# Patient Record
Sex: Male | Born: 1955
Health system: Southern US, Community
[De-identification: ages and names within clinical notes are randomized; demographics above are authoritative.]

## PROBLEM LIST (undated history)

## (undated) ENCOUNTER — Ambulatory Visit: Payer: 59

## (undated) DIAGNOSIS — K429 Umbilical hernia without obstruction or gangrene: Secondary | ICD-10-CM

## (undated) DIAGNOSIS — I4891 Unspecified atrial fibrillation: Secondary | ICD-10-CM

## (undated) DIAGNOSIS — R51 Headache: Secondary | ICD-10-CM

## (undated) DIAGNOSIS — R519 Headache, unspecified: Secondary | ICD-10-CM

## (undated) DIAGNOSIS — I10 Essential (primary) hypertension: Secondary | ICD-10-CM

## (undated) DIAGNOSIS — N189 Chronic kidney disease, unspecified: Secondary | ICD-10-CM

## (undated) DIAGNOSIS — E785 Hyperlipidemia, unspecified: Secondary | ICD-10-CM

## (undated) DIAGNOSIS — I499 Cardiac arrhythmia, unspecified: Secondary | ICD-10-CM

## (undated) DIAGNOSIS — I77810 Thoracic aortic ectasia: Secondary | ICD-10-CM

## (undated) DIAGNOSIS — R011 Cardiac murmur, unspecified: Secondary | ICD-10-CM

## (undated) DIAGNOSIS — M1990 Unspecified osteoarthritis, unspecified site: Secondary | ICD-10-CM

## (undated) HISTORY — PX: HERNIA REPAIR: SHX51

## (undated) HISTORY — PX: KNEE ARTHROSCOPY: SUR90

## (undated) HISTORY — PX: JOINT REPLACEMENT: SHX530

---

## 1989-08-11 DIAGNOSIS — I499 Cardiac arrhythmia, unspecified: Secondary | ICD-10-CM

## 1989-08-11 HISTORY — DX: Cardiac arrhythmia, unspecified: I49.9

## 2011-10-21 ENCOUNTER — Other Ambulatory Visit: Payer: Self-pay

## 2011-10-21 ENCOUNTER — Emergency Department (HOSPITAL_COMMUNITY)
Admission: EM | Admit: 2011-10-21 | Discharge: 2011-10-21 | Disposition: A | Discharge status: Home / Self Care | Payer: Worker's Compensation | Attending: Emergency Medicine | Admitting: Emergency Medicine

## 2011-10-21 ENCOUNTER — Emergency Department (HOSPITAL_COMMUNITY): Payer: Worker's Compensation

## 2011-10-21 ENCOUNTER — Encounter (HOSPITAL_COMMUNITY): Payer: Self-pay | Admitting: Emergency Medicine

## 2011-10-21 DIAGNOSIS — Z7729 Contact with and (suspected ) exposure to other hazardous substances: Secondary | ICD-10-CM | POA: Insufficient documentation

## 2011-10-21 DIAGNOSIS — I10 Essential (primary) hypertension: Secondary | ICD-10-CM | POA: Insufficient documentation

## 2011-10-21 DIAGNOSIS — R51 Headache: Secondary | ICD-10-CM | POA: Insufficient documentation

## 2011-10-21 HISTORY — DX: Essential (primary) hypertension: I10

## 2011-10-21 LAB — CBC
MCV: 71.9 fL — ABNORMAL LOW (ref 78.0–100.0)
Platelets: 299 10*3/uL (ref 150–400)
RDW: 14.9 % (ref 11.5–15.5)
WBC: 7.6 10*3/uL (ref 4.0–10.5)

## 2011-10-21 LAB — BASIC METABOLIC PANEL
CO2: 27 mEq/L (ref 19–32)
Calcium: 10.2 mg/dL (ref 8.4–10.5)
Creatinine, Ser: 1.56 mg/dL — ABNORMAL HIGH (ref 0.50–1.35)
Glucose, Bld: 107 mg/dL — ABNORMAL HIGH (ref 70–99)

## 2011-10-21 LAB — DIFFERENTIAL
Basophils Relative: 0 % (ref 0–1)
Eosinophils Relative: 1 % (ref 0–5)
Lymphs Abs: 2.7 10*3/uL (ref 0.7–4.0)
Monocytes Absolute: 0.5 10*3/uL (ref 0.1–1.0)

## 2011-10-21 LAB — URINALYSIS, ROUTINE W REFLEX MICROSCOPIC
Hgb urine dipstick: NEGATIVE
Protein, ur: 100 mg/dL — AB
Urobilinogen, UA: 0.2 mg/dL (ref 0.0–1.0)

## 2011-10-21 MED ORDER — CLONIDINE HCL 0.1 MG PO TABS
0.2000 mg | ORAL_TABLET | Freq: Once | ORAL | Status: AC
  Administered 2011-10-21: 0.2 mg via ORAL
  Filled 2011-10-21: qty 2

## 2011-10-21 MED ORDER — POTASSIUM CHLORIDE CRYS ER 20 MEQ PO TBCR
40.0000 meq | EXTENDED_RELEASE_TABLET | Freq: Once | ORAL | Status: AC
  Administered 2011-10-21: 40 meq via ORAL
  Filled 2011-10-21: qty 2

## 2011-10-21 NOTE — ED Notes (Signed)
GP:[id number] Expected date:10/21/11<BR> Expected time:10:21 AM<BR> Means of arrival:<BR> Comments:<BR> Ambulance, SOB, CP

## 2011-10-21 NOTE — ED Notes (Signed)
Off floor for testing 

## 2011-10-21 NOTE — ED Notes (Signed)
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

## 2011-10-21 NOTE — ED Provider Notes (Signed)
History     CSN: [id number]  Arrival date & time 10/21/11  39   First MD Initiated Contact with Patient 10/21/11 1104      Chief Complaint  Patient presents with  . Hypertension    (Consider location/radiation/quality/duration/timing/severity/associated sxs/prior treatment) HPI Comments: Patient presents for evaluation after inhalation of unknown substance while at work. Patient works as a Technical sales engineer. States working he inhaled a white substance which small right sulfa after a bag was managed. He states he fell some throbbing in his head which resolved upon breathing normal air. He was decontaminated by the hazmat team as well as in the emergency department. The substance remains unknown after thorough evaluation by the hazmat team. Patient states he is back to his baseline. Was found to be hypertensive. Is on 3 hypertensive medications and just saw his primary care physician last week. He denies chest pain, shortness of breath, nausea, vomiting, vision changes, urinary changes.  Patient is a 56 y.o. male presenting with hypertension. The history is provided by the patient. No language interpreter was used.  Hypertension This is a chronic problem. The problem occurs constantly. The problem has not changed since onset.Associated symptoms include headaches (only when inhaling substance). Pertinent negatives include no chest pain, no abdominal pain and no shortness of breath. The symptoms are aggravated by nothing. The symptoms are relieved by nothing.    Past Medical History  Diagnosis Date  . Hypertension     Past Surgical History  Procedure Date  . Hernia repair   . Knee arthroscopy     No family history on file.  History  Substance Use Topics  . Smoking status: Not on file  . Smokeless tobacco: Never Used  . Alcohol Use: No      Review of Systems  Constitutional: Negative for fever, chills, activity change, appetite change and fatigue.  HENT: Negative for  congestion, sore throat, rhinorrhea, neck pain and neck stiffness.   Respiratory: Negative for cough and shortness of breath.   Cardiovascular: Negative for chest pain and palpitations.  Gastrointestinal: Negative for nausea, vomiting and abdominal pain.  Genitourinary: Negative for dysuria, urgency, frequency and flank pain.  Musculoskeletal: Negative for myalgias, back pain and arthralgias.  Neurological: Positive for headaches (only when inhaling substance). Negative for dizziness, weakness, light-headedness and numbness.  All other systems reviewed and are negative.    Allergies  Review of patient's allergies indicates no known allergies.  Home Medications  No current outpatient prescriptions on file.  BP 147/102  Pulse 67  Resp 18  SpO2 95%  Physical Exam  Nursing note and vitals reviewed. Constitutional: He is oriented to person, place, and time. He appears well-developed and well-nourished. No distress.       Obese male  HENT:  Head: Normocephalic and atraumatic.  Mouth/Throat: Oropharynx is clear and moist.  Eyes: Conjunctivae and EOM are normal. Pupils are equal, round, and reactive to light.  Neck: Normal range of motion. Neck supple.  Cardiovascular: Normal rate, regular rhythm, normal heart sounds and intact distal pulses.  Exam reveals no gallop and no friction rub.   No murmur heard. Pulmonary/Chest: Effort normal and breath sounds normal. No respiratory distress. He exhibits no tenderness.  Abdominal: Soft. Bowel sounds are normal. There is no tenderness.  Musculoskeletal: Normal range of motion. He exhibits no tenderness.  Neurological: He is alert and oriented to person, place, and time. No cranial nerve deficit.  Skin: Skin is warm and dry. No rash noted.  ED Course  Procedures (including critical care time)   Date: 10/21/2011  Rate: 77  Rhythm: normal sinus rhythm  QRS Axis: normal  Intervals: normal  ST/T Wave abnormalities: normal  Conduction  Disutrbances:none  Narrative Interpretation:   Old EKG Reviewed: none available  Labs Reviewed  URINALYSIS, ROUTINE W REFLEX MICROSCOPIC - Abnormal; Notable for the following:    Protein, ur 100 (*)    All other components within normal limits  CBC - Abnormal; Notable for the following:    RBC 6.15 (*)    MCV 71.9 (*)    MCHC 36.4 (*)    All other components within normal limits  BASIC METABOLIC PANEL - Abnormal; Notable for the following:    Potassium 3.1 (*)    Glucose, Bld 107 (*)    Creatinine, Ser 1.56 (*)    GFR calc non Af Amer 49 (*)    GFR calc Af Amer 56 (*)    All other components within normal limits  URINE MICROSCOPIC-ADD ON - Abnormal; Notable for the following:    Bacteria, UA FEW (*)    All other components within normal limits  DIFFERENTIAL   Dg Chest 2 View  10/21/2011  *RADIOLOGY REPORT*  Clinical Data: Possible toxin inhalation.  CHEST - 2 VIEW  Comparison: None.  Findings: Heart size and vascularity are normal and the lungs are clear.  Slight thoracic scoliosis with osteophytes.  IMPRESSION: No acute abnormalities.  Original Report Authenticated By: Larey Seat, M.D.     1. Exposure to potentially hazardous substances   2. Hypertension       MDM  Her workup was unremarkable. Arrival to emergency department after decontamination the patient was asymptomatic. He is monitored in emergency department for approximately 3 hours no change in status. He is not be hypertensive on arrival. Given dose of clonidine. Instructed to followup with his primary care physician for further evaluation and management of his blood pressure. His blood pressure is not in immediate danger. EKG is unremarkable. Has slight elevation of his creatinine 1.56 which he feels is stable. His potassium was repleted. Provided instructions for return        Trisha Mangle, MD 10/21/11 1411

## 2011-10-21 NOTE — Discharge Instructions (Signed)
Arterial Hypertension Arterial hypertension (high blood pressure) is a condition of elevated pressure in your blood vessels. Hypertension over a long period of time is a risk factor for strokes, heart attacks, and heart failure. It is also the leading cause of kidney (renal) failure.  CAUSES   In Adults -- Over 90% of all hypertension has no known cause. This is called essential or primary hypertension. In the other 10% of people with hypertension, the increase in blood pressure is caused by another disorder. This is called secondary hypertension. Important causes of secondary hypertension are:   Heavy alcohol use.   Obstructive sleep apnea.   Hyperaldosterosim (Conn's syndrome).   Steroid use.   Chronic kidney failure.   Hyperparathyroidism.   Medications.   Renal artery stenosis.   Pheochromocytoma.   Cushing's disease.   Coarctation of the aorta.   Scleroderma renal crisis.   Licorice (in excessive amounts).   Drugs (cocaine, methamphetamine).  Your caregiver can explain any items above that apply to you.  In Children -- Secondary hypertension is more common and should always be considered.   Pregnancy -- Few women of childbearing age have high blood pressure. However, up to 10% of them develop hypertension of pregnancy. Generally, this will not harm the woman. It may be a sign of 3 complications of pregnancy: preeclampsia, HELLP syndrome, and eclampsia. Follow up and control with medication is necessary.  SYMPTOMS   This condition normally does not produce any noticeable symptoms. It is usually found during a routine exam.   Malignant hypertension is a late problem of high blood pressure. It may have the following symptoms:   Headaches.   Blurred vision.   End-organ damage (this means your kidneys, heart, lungs, and other organs are being damaged).   Stressful situations can increase the blood pressure. If a person with normal blood pressure has their blood  pressure go up while being seen by their caregiver, this is often termed "white coat hypertension." Its importance is not known. It may be related with eventually developing hypertension or complications of hypertension.   Hypertension is often confused with mental tension, stress, and anxiety.  DIAGNOSIS  The diagnosis is made by 3 separate blood pressure measurements. They are taken at least 1 week apart from each other. If there is organ damage from hypertension, the diagnosis may be made without repeat measurements. Hypertension is usually identified by having blood pressure readings:  Above 140/90 mmHg measured in both arms, at 3 separate times, over a couple weeks.   Over 130/80 mmHg should be considered a risk factor and may require treatment in patients with diabetes.  Blood pressure readings over 120/80 mmHg are called "pre-hypertension" even in non-diabetic patients. To get a true blood pressure measurement, use the following guidelines. Be aware of the factors that can alter blood pressure readings.  Take measurements at least 1 hour after caffeine.   Take measurements 30 minutes after smoking and without any stress. This is another reason to quit smoking - it raises your blood pressure.   Use a proper cuff size. Ask your caregiver if you are not sure about your cuff size.   Most home blood pressure cuffs are automatic. They will measure systolic and diastolic pressures. The systolic pressure is the pressure reading at the start of sounds. Diastolic pressure is the pressure at which the sounds disappear. If you are elderly, measure pressures in multiple postures. Try sitting, lying or standing.   Sit at rest for a minimum of   5 minutes before taking measurements.   You should not be on any medications like decongestants. These are found in many cold medications.   Record your blood pressure readings and review them with your caregiver.  If you have hypertension:  Your caregiver  may do tests to be sure you do not have secondary hypertension (see "causes" above).   Your caregiver may also look for signs of metabolic syndrome. This is also called Syndrome X or Insulin Resistance Syndrome. You may have this syndrome if you have type 2 diabetes, abdominal obesity, and abnormal blood lipids in addition to hypertension.   Your caregiver will take your medical and family history and perform a physical exam.   Diagnostic tests may include blood tests (for glucose, cholesterol, potassium, and kidney function), a urinalysis, or an EKG. Other tests may also be necessary depending on your condition.  PREVENTION  There are important lifestyle issues that you can adopt to reduce your chance of developing hypertension:  Maintain a normal weight.   Limit the amount of salt (sodium) in your diet.   Exercise often.   Limit alcohol intake.   Get enough potassium in your diet. Discuss specific advice with your caregiver.   Follow a DASH diet (dietary approaches to stop hypertension). This diet is rich in fruits, vegetables, and low-fat dairy products, and avoids certain fats.  PROGNOSIS  Essential hypertension cannot be cured. Lifestyle changes and medical treatment can lower blood pressure and reduce complications. The prognosis of secondary hypertension depends on the underlying cause. Many people whose hypertension is controlled with medicine or lifestyle changes can live a normal, healthy life.  RISKS AND COMPLICATIONS  While high blood pressure alone is not an illness, it often requires treatment due to its short- and long-term effects on many organs. Hypertension increases your risk for:  CVAs or strokes (cerebrovascular accident).   Heart failure due to chronically high blood pressure (hypertensive cardiomyopathy).   Heart attack (myocardial infarction).   Damage to the retina (hypertensive retinopathy).   Kidney failure (hypertensive nephropathy).  Your caregiver can  explain list items above that apply to you. Treatment of hypertension can significantly reduce the risk of complications. TREATMENT   For overweight patients, weight loss and regular exercise are recommended. Physical fitness lowers blood pressure.   Mild hypertension is usually treated with diet and exercise. A diet rich in fruits and vegetables, fat-free dairy products, and foods low in fat and salt (sodium) can help lower blood pressure. Decreasing salt intake decreases blood pressure in a 1/3 of people.   Stop smoking if you are a smoker.  The steps above are highly effective in reducing blood pressure. While these actions are easy to suggest, they are difficult to achieve. Most patients with moderate or severe hypertension end up requiring medications to bring their blood pressure down to a normal level. There are several classes of medications for treatment. Blood pressure pills (antihypertensives) will lower blood pressure by their different actions. Lowering the blood pressure by 10 mmHg may decrease the risk of complications by as much as 25%. The goal of treatment is effective blood pressure control. This will reduce your risk for complications. Your caregiver will help you determine the best treatment for you according to your lifestyle. What is excellent treatment for one person, may not be for you. HOME CARE INSTRUCTIONS   Do not smoke.   Follow the lifestyle changes outlined in the "Prevention" section.   If you are on medications, follow the directions   carefully. Blood pressure medications must be taken as prescribed. Skipping doses reduces their benefit. It also puts you at risk for problems.   Follow up with your caregiver, as directed.   If you are asked to monitor your blood pressure at home, follow the guidelines in the "Diagnosis" section above.  SEEK MEDICAL CARE IF:   You think you are having medication side effects.   You have recurrent headaches or lightheadedness.     You have swelling in your ankles.   You have trouble with your vision.  SEEK IMMEDIATE MEDICAL CARE IF:   You have sudden onset of chest pain or pressure, difficulty breathing, or other symptoms of a heart attack.   You have a severe headache.   You have symptoms of a stroke (such as sudden weakness, difficulty speaking, difficulty walking).  MAKE SURE YOU:   Understand these instructions.   Will watch your condition.   Will get help right away if you are not doing well or get worse.  Document Released: 07/28/2005 Document Revised: 07/17/2011 Document Reviewed: 02/25/2007 [facility] Patient Information 2012 [place].  It is very important he follow up with your primary physician to discuss blood pressure control.

## 2012-03-11 ENCOUNTER — Other Ambulatory Visit: Payer: Self-pay | Admitting: Pain Medicine

## 2012-05-26 ENCOUNTER — Encounter (HOSPITAL_COMMUNITY): Payer: Self-pay | Admitting: Pharmacy Technician

## 2012-05-31 ENCOUNTER — Encounter (HOSPITAL_COMMUNITY)
Admission: RE | Admit: 2012-05-31 | Discharge: 2012-05-31 | Disposition: A | Discharge status: Home / Self Care | Payer: 59 | Source: Ambulatory Visit | Attending: Specialist | Admitting: Specialist

## 2012-05-31 ENCOUNTER — Encounter (HOSPITAL_COMMUNITY): Payer: Self-pay

## 2012-05-31 ENCOUNTER — Other Ambulatory Visit: Payer: Self-pay | Admitting: Pain Medicine

## 2012-05-31 HISTORY — DX: Unspecified osteoarthritis, unspecified site: M19.90

## 2012-05-31 LAB — URINALYSIS, ROUTINE W REFLEX MICROSCOPIC
Bilirubin Urine: NEGATIVE
Nitrite: NEGATIVE
Protein, ur: >300 mg/dL — AB
Specific Gravity, Urine: 1.017 (ref 1.005–1.030)
Urobilinogen, UA: 0.2 mg/dL (ref 0.0–1.0)

## 2012-05-31 LAB — APTT: aPTT: 28 seconds (ref 24–37)

## 2012-05-31 LAB — CBC WITH DIFFERENTIAL/PLATELET
Basophils Absolute: 0 10*3/uL (ref 0.0–0.1)
Eosinophils Absolute: 0.1 10*3/uL (ref 0.0–0.7)
Hemoglobin: 15.9 g/dL (ref 13.0–17.0)
Lymphocytes Relative: 33 % (ref 12–46)
MCHC: 35.8 g/dL (ref 30.0–36.0)
Monocytes Absolute: 0.5 10*3/uL (ref 0.1–1.0)
Neutrophils Relative %: 60 % (ref 43–77)
Platelets: 289 10*3/uL (ref 150–400)
RDW: 14.7 % (ref 11.5–15.5)

## 2012-05-31 LAB — COMPREHENSIVE METABOLIC PANEL
ALT: 24 U/L (ref 0–53)
Albumin: 3.9 g/dL (ref 3.5–5.2)
Alkaline Phosphatase: 63 U/L (ref 39–117)
Calcium: 9.8 mg/dL (ref 8.4–10.5)
Potassium: 3.2 mEq/L — ABNORMAL LOW (ref 3.5–5.1)
Sodium: 137 mEq/L (ref 135–145)
Total Protein: 7.8 g/dL (ref 6.0–8.3)

## 2012-05-31 LAB — ABO/RH: ABO/RH(D): O POS

## 2012-05-31 LAB — URINE MICROSCOPIC-ADD ON

## 2012-05-31 LAB — SURGICAL PCR SCREEN: Staphylococcus aureus: INVALID — AB

## 2012-05-31 NOTE — Pre-Procedure Instructions (Signed)
EKG AND CXR REPORTS ARE IN EPIC FROM 10/21/11. CBC, DIFF, CMET, PT, PTT, UA, T/S WERE DONE TODAY AT [facility] PREOP - AS PER ORDERS DR. R. A. COLLINS. PT HAS NOTE OF MEDICAL CLEARANCE FROM DR. MITCHELL.

## 2012-05-31 NOTE — Patient Instructions (Addendum)
YOUR SURGERY IS SCHEDULED AT [facility]  ON:  Friday  10/25  AT 7:30 AM  REPORT TO [facility]AY [facility] AT:  5:30 AM      PHONE # FOR SHORT STAY IS [phone]  DO NOT EAT OR DRINK ANYTHING AFTER MIDNIGHT THE NIGHT BEFORE YOUR SURGERY.  YOU MAY BRUSH YOUR TEETH, RINSE OUT YOUR MOUTH--BUT NO WATER, NO FOOD, NO CHEWING GUM, NO MINTS, NO CANDIES, NO CHEWING TOBACCO.  PLEASE TAKE THE FOLLOWING MEDICATIONS THE AM OF YOUR SURGERY WITH A FEW SIPS OF WATER:  AMLODIPINE AND ATENOLOL   IF YOU USE INHALERS--USE YOUR INHALERS THE AM OF YOUR SURGERY AND BRING INHALERS TO [facility].    IF YOU ARE DIABETIC:  DO NOT TAKE ANY DIABETIC MEDICATIONS THE AM OF YOUR SURGERY.  IF YOU TAKE INSULIN IN THE EVENINGS--PLEASE ONLY TAKE 1/2 NORMAL EVENING DOSE THE NIGHT BEFORE YOUR SURGERY.  NO INSULIN THE AM OF YOUR SURGERY.  IF YOU HAVE SLEEP APNEA AND USE CPAP OR BIPAP--PLEASE BRING THE MASK AND THE TUBING.  DO NOT BRING YOUR MACHINE.  DO NOT BRING VALUABLES, MONEY, CREDIT CARDS.  DO NOT WEAR JEWELRY, MAKE-UP, NAIL POLISH AND NO METAL PINS OR CLIPS IN YOUR HAIR. CONTACT LENS, DENTURES / PARTIALS, GLASSES SHOULD NOT BE WORN TO SURGERY AND IN MOST CASES-HEARING AIDS WILL NEED TO BE REMOVED.  BRING YOUR GLASSES CASE, ANY EQUIPMENT NEEDED FOR YOUR CONTACT LENS. FOR PATIENTS ADMITTED TO THE HOSPITAL--CHECK OUT TIME THE DAY OF DISCHARGE IS 11:00 AM.  ALL INPATIENT ROOMS ARE PRIVATE - WITH BATHROOM, TELEPHONE, TELEVISION AND WIFI INTERNET.  IF YOU ARE BEING DISCHARGED THE SAME DAY OF YOUR SURGERY--YOU CAN NOT DRIVE YOURSELF HOME--AND SHOULD NOT GO HOME ALONE BY TAXI OR BUS.  NO DRIVING OR OPERATING MACHINERY FOR 24 HOURS FOLLOWING ANESTHESIA / PAIN MEDICATIONS.  PLEASE MAKE ARRANGEMENTS FOR SOMEONE TO BE WITH YOU AT HOME THE FIRST 24 HOURS AFTER SURGERY. RESPONSIBLE DRIVER'S NAME___________________________                                               PHONE #   _______________________                                   PLEASE READ OVER ANY  FACT SHEETS THAT YOU WERE GIVEN: MRSA INFORMATION, BLOOD TRANSFUSION INFORMATION, INCENTIVE SPIROMETER INFORMATION.

## 2012-05-31 NOTE — Progress Notes (Signed)
05/31/12 1438  OBSTRUCTIVE SLEEP APNEA  Have you ever been diagnosed with sleep apnea through a sleep study? No  Do you snore loudly (loud enough to be heard through closed doors)?  0  Do you often feel tired, fatigued, or sleepy during the daytime? 0  Has anyone observed you stop breathing during your sleep? 0  Do you have, or are you being treated for high blood pressure? 1  BMI more than 35 kg/m2? 1  Age over 55 years old? 1  Neck circumference greater than 40 cm/18 inches? 1  Gender: 1  Obstructive Sleep Apnea Score 5   Score 4 or greater  Results sent to PCP

## 2012-06-01 NOTE — H&P (Signed)
Marvin Garcia, YUDIN NO.:  [id number]  MEDICAL RECORD NO.:  [id number]  LOCATION:  [facility]                        FACILITY:  [facility]  PHYSICIAN:  Cynda Familia, M.D.DATE OF BIRTH:  [birth year]  DATE OF ADMISSION:  03/10/2012 DATE OF DISCHARGE:                             HISTORY & PHYSICAL   HISTORY OF PRESENT ILLNESS:  The patient is a 56 year old gentleman well- known to Dr. [name] for evaluation and treatment of bilateral knee pain.  The patient's evaluations included multiple x-rays, arthroscopies of bilateral knees.  He has undergone Visco supplement injections, physical therapy, cortisone injections, yet the patient continues to have significant discomfort in bilateral knees limiting his activities of daily living.  He has also been to the point where he has been unable to work due to the pain in bilateral knees.  His left knee is worse than the right.  The patient's recent evaluation reveals x-rays that shows that he is bone on bone medial compartment with varus deformity.  He has osteophyte formation in the medial and lateral femoral condyles.  He has also got severe patellofemoral degenerative changes with osteophyte formations of the superior-inferior pole of the patella, irregular patellofemoral surfaces with osteophyte formation of the superior anterior femoral condyles bone-on-bone of the femoral tibial junction on lateral views with posterior compartment osteophyte formation.  He also has similar findings of his right knee.  Once again, the patient has failed cortisone Visco supplement, physical therapy, and oral medications.  The patient had a long discussion with Dr. [name] about his knees and discussions included total knee replacement.  The patient has elected to proceed this route.  He understands the pros, cons, possible complications, and the postoperative course.  The patient has been medically cleared by his primary care physician,  Dr. [name] for this upcoming surgical procedure.  ALLERGIES:  None.  CURRENT MEDICATIONS: 1. Lipitor 20 mg once a day. 2. Lisinopril 10 mg once a day. 3. Hydrochlorothiazide 25 mg once a day. 4. Atenolol 100 mg once a day. 5. Amlodipine 10 mg once a day. 6. Vitamin C. 7. One-A-Day Men's Vitamins. 8. Aleve. 9. Fish oil. 10.Glucosamine chondroitin.  Primary care physician is Dr. [name].  CURRENT MEDICAL HISTORY: 1. Hypertension. 2. Hypercholesterolemia. 3. Osteoarthritis of bilateral knees. 4. Obesity with a BMI of 39.5.  REVIEW OF SYSTEMS:  NEUROLOGIC:  He denies any strokes, seizures, convulsions.  No numbness or tingling, muscle weakness, loss of hearing, vision, or memory, drug or alcohol problems.  PULMONARY:  Denies any shortness of breath, productive cough, wheezing.  No history of asthma, pneumonia, COPD, sleep apnea, or tuberculosis.  CARDIOVASCULAR:  He has had a stress test greater than 10 years previous.  He does have stable hypertension, hypercholesterolemia on this current medications.  He denies any recent chest pain, pressure, shortness of breath with activities, PND.  He denies any sense of heart rate beating too fast, too slow, skipping beats.  GI:  He denies any problems with ulcers, unusual indigestion, constipation, diarrhea, jaundice, diverticular problems, hepatitis, gallbladder issues, or ulcers.  GU:  He denies any problems of urinating, no history of kidney stones, BPH, urinary tract infections.  ENDOCRINE:  He denies any unusual  sense of too cold, too hot, excessive thirst, urination, thyroid, or diabetic issues.  PAST SURGICAL HISTORY:  History of hernia repair in 1993.  He has had right and left knee scopes in the past without any complications with anesthesia in the past.  FAMILY MEDICAL HISTORY:  Father is deceased from heart disease.  Mother is alive with diabetes at the age of 4.  SOCIAL HISTORY:  The patient is married.   He works for the [employer]  He has never smoked.  No alcohol or drugs.  He has 2 grown children.  Lives with his wife who will we be providing care for him postop for the 1st week.  PHYSICAL EXAMINATION:  VITAL SIGNS:  Height is 5 feet 7 inches, his weight is 235, blood pressure is 152/102.  He is very nervous today. Pulse of 80 and regular.  Respirations 12 and nonlabored.  He is afebrile. GENERAL:  This is a healthy-appearing well-developed centrally obese gentleman, conscious, alert and appropriate.  Walks with a fairly easy balanced gait.  Has obvious varus deformities of bilateral knees. NECK:  Supple.  No palpable lymphadenopathy. CHEST:  Lung sounds were clear throughout. HEART:  Regular rate and rhythm.  No murmurs. EXTREMITIES:  Good range of motion of upper extremities without any difficulty.  Lower extremities had good range of motion.  Both hips without any difficulty.  His right knee had a varus deformity.  He was able to fully extend it and flex it back to about 115 degrees. He had soft medial joint.  He had no ligament instability.  His calf is soft, nontender.  Good pulses.  No edema in the lower extremity.  His left knee, he had a very varus deformity.  He was able to fully extend and flex it back to about 110 degrees.  He was tender in medial joint, no ligament instability.  Crepitus in knee with motion.  His calf was soft, nontender.  No signs of phlebitis or edema.  Good pulses in the ankle. Good sensation. ABDOMEN:  Soft, nontender.  Bowel sounds present.  Round, obese like. BREASTS:  Deferred. RECTAL:  Deferred. GU:  Deferred.  IMPRESSION: 1. End-stage osteoarthritis, bilateral knees, with bone-on-bone medial     compartment varus deformities with bone-on-bone patellofemoral     joint with large osteophyte formation of the patellofemoral and of     the medial femoral joint.  Bilateral knees, left more symptomatic     than right. 2.  Hypertension. 3. Hypercholesterolemia. 4. Obesity.  PLAN:  The patient will undergo all routine labs and tests prior to having a left total knee arthroplasty by Dr. [name] at [facility] on June 04, 2012.  The patient will undergo routine postoperative course.  The patient questions whether or not he may need skilled nursing due to the fact that his wife want to be home for the 1st of the week, and I did review this with him about his progress and home health physical therapy.     Evert Kohl, P.A.   ______________________________ Cynda Familia, M.D.    RWK/MEDQ  D:  05/31/2012  T:  06/01/2012  Job:  [id number]

## 2012-06-03 LAB — MRSA CULTURE

## 2012-06-04 ENCOUNTER — Inpatient Hospital Stay (HOSPITAL_COMMUNITY)
Admission: RE | Admit: 2012-06-04 | Discharge: 2012-06-07 | DRG: 470 | Disposition: A | Discharge status: Home Health | Payer: 59 | Source: Ambulatory Visit | Attending: Specialist | Admitting: Specialist

## 2012-06-04 ENCOUNTER — Inpatient Hospital Stay (HOSPITAL_COMMUNITY): Payer: 59 | Admitting: Anesthesiology

## 2012-06-04 ENCOUNTER — Encounter (HOSPITAL_COMMUNITY)
Admission: RE | Disposition: A | Discharge status: Home Health | Payer: Self-pay | Source: Ambulatory Visit | Attending: Specialist

## 2012-06-04 ENCOUNTER — Encounter (HOSPITAL_COMMUNITY): Payer: Self-pay | Admitting: Anesthesiology

## 2012-06-04 ENCOUNTER — Encounter (HOSPITAL_COMMUNITY): Payer: Self-pay | Admitting: *Deleted

## 2012-06-04 DIAGNOSIS — M1710 Unilateral primary osteoarthritis, unspecified knee: Principal | ICD-10-CM | POA: Diagnosis present

## 2012-06-04 DIAGNOSIS — Z01812 Encounter for preprocedural laboratory examination: Secondary | ICD-10-CM

## 2012-06-04 DIAGNOSIS — I10 Essential (primary) hypertension: Secondary | ICD-10-CM | POA: Diagnosis present

## 2012-06-04 DIAGNOSIS — Z96659 Presence of unspecified artificial knee joint: Secondary | ICD-10-CM

## 2012-06-04 DIAGNOSIS — E669 Obesity, unspecified: Secondary | ICD-10-CM | POA: Diagnosis present

## 2012-06-04 DIAGNOSIS — D72829 Elevated white blood cell count, unspecified: Secondary | ICD-10-CM | POA: Diagnosis not present

## 2012-06-04 HISTORY — PX: TOTAL KNEE ARTHROPLASTY: SHX125

## 2012-06-04 LAB — TYPE AND SCREEN
ABO/RH(D): O POS
Antibody Screen: NEGATIVE

## 2012-06-04 SURGERY — ARTHROPLASTY, KNEE, TOTAL
Anesthesia: Spinal | Site: Knee | Laterality: Left | Wound class: Clean

## 2012-06-04 MED ORDER — HYDRALAZINE HCL 20 MG/ML IJ SOLN
INTRAMUSCULAR | Status: DC | PRN
  Administered 2012-06-04: 2.5 mg via INTRAVENOUS
  Administered 2012-06-04 (×2): 5 mg via INTRAVENOUS
  Administered 2012-06-04: 2.5 mg via INTRAVENOUS

## 2012-06-04 MED ORDER — AMLODIPINE BESYLATE 10 MG PO TABS
10.0000 mg | ORAL_TABLET | Freq: Every morning | ORAL | Status: DC
  Administered 2012-06-05 – 2012-06-07 (×3): 10 mg via ORAL
  Filled 2012-06-04 (×3): qty 1

## 2012-06-04 MED ORDER — METHOCARBAMOL 500 MG PO TABS
500.0000 mg | ORAL_TABLET | Freq: Four times a day (QID) | ORAL | Status: DC | PRN
  Administered 2012-06-04 – 2012-06-07 (×6): 500 mg via ORAL
  Filled 2012-06-04 (×6): qty 1

## 2012-06-04 MED ORDER — FENTANYL CITRATE 0.05 MG/ML IJ SOLN
INTRAMUSCULAR | Status: DC | PRN
  Administered 2012-06-04: 25 ug via INTRAVENOUS
  Administered 2012-06-04 (×2): 50 ug via INTRAVENOUS
  Administered 2012-06-04: 25 ug via INTRAVENOUS
  Administered 2012-06-04: 50 ug via INTRAVENOUS

## 2012-06-04 MED ORDER — ATORVASTATIN CALCIUM 20 MG PO TABS
20.0000 mg | ORAL_TABLET | Freq: Every evening | ORAL | Status: DC
  Administered 2012-06-04 – 2012-06-06 (×3): 20 mg via ORAL
  Filled 2012-06-04 (×4): qty 1

## 2012-06-04 MED ORDER — ATENOLOL 100 MG PO TABS
100.0000 mg | ORAL_TABLET | Freq: Every day | ORAL | Status: DC
Start: 2012-06-05 — End: 2012-06-07
  Administered 2012-06-05 – 2012-06-07 (×3): 100 mg via ORAL
  Filled 2012-06-04 (×3): qty 1

## 2012-06-04 MED ORDER — CEFAZOLIN SODIUM-DEXTROSE 2-3 GM-% IV SOLR
2.0000 g | Freq: Four times a day (QID) | INTRAVENOUS | Status: AC
  Administered 2012-06-04 (×2): 2 g via INTRAVENOUS
  Filled 2012-06-04 (×2): qty 50

## 2012-06-04 MED ORDER — SODIUM CHLORIDE 0.9 % IR SOLN
Status: DC | PRN
  Administered 2012-06-04: 3000 mL

## 2012-06-04 MED ORDER — SODIUM CHLORIDE 0.9 % IV SOLN: INTRAVENOUS | Status: DC

## 2012-06-04 MED ORDER — BISACODYL 10 MG RE SUPP: 10.0000 mg | Freq: Every day | RECTAL | Status: DC | PRN

## 2012-06-04 MED ORDER — ACETAMINOPHEN 650 MG RE SUPP: 650.0000 mg | Freq: Four times a day (QID) | RECTAL | Status: DC | PRN

## 2012-06-04 MED ORDER — ACETAMINOPHEN 10 MG/ML IV SOLN
INTRAVENOUS | Status: DC | PRN
  Administered 2012-06-04: 1000 mg via INTRAVENOUS

## 2012-06-04 MED ORDER — ONDANSETRON HCL 4 MG/2ML IJ SOLN: 4.0000 mg | Freq: Four times a day (QID) | INTRAMUSCULAR | Status: DC | PRN

## 2012-06-04 MED ORDER — OXYCODONE HCL 5 MG PO TABS: 5.0000 mg | ORAL_TABLET | Freq: Once | ORAL | Status: DC | PRN

## 2012-06-04 MED ORDER — DOCUSATE SODIUM 100 MG PO CAPS
100.0000 mg | ORAL_CAPSULE | Freq: Two times a day (BID) | ORAL | Status: DC
  Administered 2012-06-04 – 2012-06-07 (×6): 100 mg via ORAL

## 2012-06-04 MED ORDER — MEPERIDINE HCL 50 MG/ML IJ SOLN: 6.2500 mg | INTRAMUSCULAR | Status: DC | PRN

## 2012-06-04 MED ORDER — CEFAZOLIN SODIUM-DEXTROSE 2-3 GM-% IV SOLR
2.0000 g | INTRAVENOUS | Status: AC
  Administered 2012-06-04: 2 g via INTRAVENOUS

## 2012-06-04 MED ORDER — POVIDONE-IODINE 7.5 % EX SOLN: Freq: Once | CUTANEOUS | Status: DC

## 2012-06-04 MED ORDER — PROMETHAZINE HCL 25 MG/ML IJ SOLN: 6.2500 mg | INTRAMUSCULAR | Status: DC | PRN

## 2012-06-04 MED ORDER — MENTHOL 3 MG MT LOZG
1.0000 | LOZENGE | OROMUCOSAL | Status: DC | PRN
  Filled 2012-06-04: qty 9

## 2012-06-04 MED ORDER — METOCLOPRAMIDE HCL 10 MG PO TABS: 5.0000 mg | ORAL_TABLET | Freq: Three times a day (TID) | ORAL | Status: DC | PRN

## 2012-06-04 MED ORDER — ENOXAPARIN SODIUM 30 MG/0.3ML ~~LOC~~ SOLN
30.0000 mg | Freq: Two times a day (BID) | SUBCUTANEOUS | Status: DC
  Administered 2012-06-05 – 2012-06-07 (×5): 30 mg via SUBCUTANEOUS
  Filled 2012-06-04 (×7): qty 0.3

## 2012-06-04 MED ORDER — POTASSIUM CHLORIDE IN NACL 20-0.9 MEQ/L-% IV SOLN
INTRAVENOUS | Status: DC
  Administered 2012-06-04 – 2012-06-05 (×2): via INTRAVENOUS
  Filled 2012-06-04 (×3): qty 1000

## 2012-06-04 MED ORDER — BUPIVACAINE-EPINEPHRINE 0.25% -1:200000 IJ SOLN
INTRAMUSCULAR | Status: DC | PRN
  Administered 2012-06-04: 50 mL

## 2012-06-04 MED ORDER — VITAMIN C 500 MG PO TABS
500.0000 mg | ORAL_TABLET | Freq: Every day | ORAL | Status: DC
  Administered 2012-06-05 – 2012-06-07 (×3): 500 mg via ORAL
  Filled 2012-06-04 (×3): qty 1

## 2012-06-04 MED ORDER — FERROUS SULFATE 325 (65 FE) MG PO TABS
325.0000 mg | ORAL_TABLET | Freq: Three times a day (TID) | ORAL | Status: DC
  Administered 2012-06-04 – 2012-06-07 (×8): 325 mg via ORAL
  Filled 2012-06-04 (×11): qty 1

## 2012-06-04 MED ORDER — DIPHENHYDRAMINE HCL 12.5 MG/5ML PO ELIX: 12.5000 mg | ORAL_SOLUTION | ORAL | Status: DC | PRN

## 2012-06-04 MED ORDER — PROPOFOL 10 MG/ML IV EMUL
INTRAVENOUS | Status: DC | PRN
  Administered 2012-06-04: 50 ug/kg/min via INTRAVENOUS

## 2012-06-04 MED ORDER — LACTATED RINGERS IV SOLN
INTRAVENOUS | Status: DC | PRN
  Administered 2012-06-04 (×3): via INTRAVENOUS

## 2012-06-04 MED ORDER — ONDANSETRON HCL 4 MG PO TABS: 4.0000 mg | ORAL_TABLET | Freq: Four times a day (QID) | ORAL | Status: DC | PRN

## 2012-06-04 MED ORDER — FLEET ENEMA 7-19 GM/118ML RE ENEM: 1.0000 | ENEMA | Freq: Once | RECTAL | Status: AC | PRN

## 2012-06-04 MED ORDER — METHOCARBAMOL 100 MG/ML IJ SOLN
500.0000 mg | Freq: Four times a day (QID) | INTRAVENOUS | Status: DC | PRN
  Administered 2012-06-04: 500 mg via INTRAVENOUS
  Filled 2012-06-04: qty 5

## 2012-06-04 MED ORDER — HYDROMORPHONE HCL PF 1 MG/ML IJ SOLN
0.2500 mg | INTRAMUSCULAR | Status: DC | PRN
  Administered 2012-06-04 (×4): 0.5 mg via INTRAVENOUS

## 2012-06-04 MED ORDER — ONDANSETRON HCL 4 MG/2ML IJ SOLN
INTRAMUSCULAR | Status: DC | PRN
  Administered 2012-06-04: 4 mg via INTRAVENOUS

## 2012-06-04 MED ORDER — LISINOPRIL 40 MG PO TABS
40.0000 mg | ORAL_TABLET | Freq: Every morning | ORAL | Status: DC
  Administered 2012-06-05 – 2012-06-07 (×3): 40 mg via ORAL
  Filled 2012-06-04 (×3): qty 1

## 2012-06-04 MED ORDER — METOCLOPRAMIDE HCL 5 MG/ML IJ SOLN: 5.0000 mg | Freq: Three times a day (TID) | INTRAMUSCULAR | Status: DC | PRN

## 2012-06-04 MED ORDER — ACETAMINOPHEN 10 MG/ML IV SOLN
1000.0000 mg | Freq: Four times a day (QID) | INTRAVENOUS | Status: AC
  Administered 2012-06-04 – 2012-06-05 (×4): 1000 mg via INTRAVENOUS
  Filled 2012-06-04 (×6): qty 100

## 2012-06-04 MED ORDER — PHENOL 1.4 % MT LIQD: 1.0000 | OROMUCOSAL | Status: DC | PRN

## 2012-06-04 MED ORDER — ADULT MULTIVITAMIN W/MINERALS CH
1.0000 | ORAL_TABLET | Freq: Every day | ORAL | Status: DC
  Administered 2012-06-04 – 2012-06-07 (×4): 1 via ORAL
  Filled 2012-06-04 (×4): qty 1

## 2012-06-04 MED ORDER — POLYETHYLENE GLYCOL 3350 17 G PO PACK: 17.0000 g | PACK | Freq: Every day | ORAL | Status: DC | PRN

## 2012-06-04 MED ORDER — ACETAMINOPHEN 325 MG PO TABS
650.0000 mg | ORAL_TABLET | Freq: Four times a day (QID) | ORAL | Status: DC | PRN
  Administered 2012-06-05: 650 mg via ORAL
  Filled 2012-06-04: qty 2

## 2012-06-04 MED ORDER — HYDROMORPHONE HCL PF 1 MG/ML IJ SOLN: 0.5000 mg | INTRAMUSCULAR | Status: DC | PRN

## 2012-06-04 MED ORDER — HYDROCHLOROTHIAZIDE 25 MG PO TABS
25.0000 mg | ORAL_TABLET | Freq: Every morning | ORAL | Status: DC
  Administered 2012-06-04 – 2012-06-07 (×4): 25 mg via ORAL
  Filled 2012-06-04 (×4): qty 1

## 2012-06-04 MED ORDER — OXYCODONE HCL 5 MG/5ML PO SOLN
5.0000 mg | Freq: Once | ORAL | Status: DC | PRN
  Filled 2012-06-04: qty 5

## 2012-06-04 MED ORDER — 0.9 % SODIUM CHLORIDE (POUR BTL) OPTIME
TOPICAL | Status: DC | PRN
  Administered 2012-06-04: 1000 mL

## 2012-06-04 MED ORDER — KETOROLAC TROMETHAMINE 30 MG/ML IJ SOLN
INTRAMUSCULAR | Status: DC | PRN
  Administered 2012-06-04: 30 mg via INTRAVENOUS

## 2012-06-04 MED ORDER — ALUM & MAG HYDROXIDE-SIMETH 200-200-20 MG/5ML PO SUSP: 30.0000 mL | ORAL | Status: DC | PRN

## 2012-06-04 MED ORDER — BUPIVACAINE HCL (PF) 0.75 % IJ SOLN
INTRAMUSCULAR | Status: DC | PRN
  Administered 2012-06-04: 15 mg via INTRATHECAL

## 2012-06-04 MED ORDER — KETAMINE HCL 10 MG/ML IJ SOLN
INTRAMUSCULAR | Status: DC | PRN
  Administered 2012-06-04: 10 mg via INTRAVENOUS

## 2012-06-04 MED ORDER — OXYCODONE HCL 5 MG PO TABS
5.0000 mg | ORAL_TABLET | ORAL | Status: DC | PRN
  Administered 2012-06-04 – 2012-06-06 (×13): 10 mg via ORAL
  Administered 2012-06-07: 5 mg via ORAL
  Administered 2012-06-07 (×3): 10 mg via ORAL
  Administered 2012-06-07: 5 mg via ORAL
  Filled 2012-06-04 (×17): qty 2

## 2012-06-04 MED ORDER — MIDAZOLAM HCL 5 MG/5ML IJ SOLN
INTRAMUSCULAR | Status: DC | PRN
  Administered 2012-06-04: 2 mg via INTRAVENOUS

## 2012-06-04 MED ORDER — ZOLPIDEM TARTRATE 5 MG PO TABS: 5.0000 mg | ORAL_TABLET | Freq: Every evening | ORAL | Status: DC | PRN

## 2012-06-04 MED ORDER — ACETAMINOPHEN 10 MG/ML IV SOLN: 1000.0000 mg | Freq: Once | INTRAVENOUS | Status: DC | PRN

## 2012-06-04 SURGICAL SUPPLY — 64 items
BAG SPEC THK2 15X12 ZIP CLS (MISCELLANEOUS) ×1
BAG ZIPLOCK 12X15 (MISCELLANEOUS) ×2 IMPLANT
BANDAGE ELASTIC 4 VELCRO ST LF (GAUZE/BANDAGES/DRESSINGS) ×2 IMPLANT
BANDAGE ELASTIC 6 VELCRO ST LF (GAUZE/BANDAGES/DRESSINGS) ×2 IMPLANT
BANDAGE ESMARK 6X9 LF (GAUZE/BANDAGES/DRESSINGS) ×1 IMPLANT
BANDAGE GAUZE ELAST BULKY 4 IN (GAUZE/BANDAGES/DRESSINGS) ×2 IMPLANT
BLADE SAG 18X100X1.27 (BLADE) ×2 IMPLANT
BLADE SAW SGTL 13.0X1.19X90.0M (BLADE) ×2 IMPLANT
BNDG CMPR 9X6 STRL LF SNTH (GAUZE/BANDAGES/DRESSINGS) ×1
BNDG ESMARK 6X9 LF (GAUZE/BANDAGES/DRESSINGS) ×2
CEMENT HV SMART SET (Cement) ×4 IMPLANT
CLOTH BEACON ORANGE TIMEOUT ST (SAFETY) ×2 IMPLANT
CUFF TOURN SGL QUICK 34 (TOURNIQUET CUFF) ×2
CUFF TRNQT CYL 34X4X40X1 (TOURNIQUET CUFF) ×1 IMPLANT
DRAPE EXTREMITY T 121X128X90 (DRAPE) ×2 IMPLANT
DRAPE LG THREE QUARTER DISP (DRAPES) ×2 IMPLANT
DRAPE POUCH INSTRU U-SHP 10X18 (DRAPES) ×2 IMPLANT
DRAPE U-SHAPE 47X51 STRL (DRAPES) ×2 IMPLANT
DRSG PAD ABDOMINAL 8X10 ST (GAUZE/BANDAGES/DRESSINGS) ×2 IMPLANT
DURAPREP 26ML APPLICATOR (WOUND CARE) ×2 IMPLANT
ELECT REM PT RETURN 9FT ADLT (ELECTROSURGICAL) ×2
ELECTRODE REM PT RTRN 9FT ADLT (ELECTROSURGICAL) ×1 IMPLANT
EVACUATOR 1/8 PVC DRAIN (DRAIN) ×2 IMPLANT
FACESHIELD LNG OPTICON STERILE (SAFETY) ×12 IMPLANT
GAUZE XEROFORM 2X2 STRL (GAUZE/BANDAGES/DRESSINGS) ×2 IMPLANT
GLOVE ECLIPSE 8.0 STRL XLNG CF (GLOVE) ×2 IMPLANT
GLOVE SURG ORTHO 8.0 STRL STRW (GLOVE) ×2 IMPLANT
GLOVE SURG ORTHO 9.0 STRL STRW (GLOVE) ×2 IMPLANT
GOWN STRL NON-REIN LRG LVL3 (GOWN DISPOSABLE) ×2 IMPLANT
GOWN STRL REIN XL XLG (GOWN DISPOSABLE) ×6 IMPLANT
HANDPIECE INTERPULSE COAX TIP (DISPOSABLE) ×2
IMMOBILIZER KNEE 20 (SOFTGOODS) ×2
IMMOBILIZER KNEE 20 THIGH 36 (SOFTGOODS) ×1 IMPLANT
KIT BASIN OR (CUSTOM PROCEDURE TRAY) ×2 IMPLANT
NDL SAFETY ECLIPSE 18X1.5 (NEEDLE) ×1 IMPLANT
NEEDLE HYPO 18GX1.5 SHARP (NEEDLE) ×2
NS IRRIG 1000ML POUR BTL (IV SOLUTION) ×2 IMPLANT
PACK TOTAL JOINT (CUSTOM PROCEDURE TRAY) ×2 IMPLANT
POSITIONER SURGICAL ARM (MISCELLANEOUS) ×2 IMPLANT
SET HNDPC FAN SPRY TIP SCT (DISPOSABLE) ×1 IMPLANT
SET PAD KNEE POSITIONER (MISCELLANEOUS) ×2 IMPLANT
SPONGE GAUZE 4X4 12PLY (GAUZE/BANDAGES/DRESSINGS) ×2 IMPLANT
SPONGE LAP 18X18 X RAY DECT (DISPOSABLE) IMPLANT
SPONGE SURGIFOAM ABS GEL 100 (HEMOSTASIS) ×2 IMPLANT
STOCKINETTE 6  STRL (DRAPES) ×1
STOCKINETTE 6 STRL (DRAPES) ×1 IMPLANT
STRIP CLOSURE SKIN 1/2X4 (GAUZE/BANDAGES/DRESSINGS) ×4 IMPLANT
SUCTION FRAZIER 12FR DISP (SUCTIONS) ×2 IMPLANT
SUT BONE WAX W31G (SUTURE) ×2 IMPLANT
SUT MNCRL AB 3-0 PS2 18 (SUTURE) ×2 IMPLANT
SUT VIC AB 0 CT1 27 (SUTURE)
SUT VIC AB 0 CT1 27XBRD ANTBC (SUTURE) IMPLANT
SUT VIC AB 1 CT1 27 (SUTURE) ×8
SUT VIC AB 1 CT1 27XBRD ANTBC (SUTURE) ×4 IMPLANT
SUT VIC AB 2-0 CT1 27 (SUTURE) ×4
SUT VIC AB 2-0 CT1 TAPERPNT 27 (SUTURE) ×2 IMPLANT
SUT VLOC 180 0 24IN GS25 (SUTURE) ×2 IMPLANT
SYR 50ML LL SCALE MARK (SYRINGE) ×2 IMPLANT
TAPE STRIPS DRAPE STRL (GAUZE/BANDAGES/DRESSINGS) ×2 IMPLANT
TOWEL OR 17X26 10 PK STRL BLUE (TOWEL DISPOSABLE) ×4 IMPLANT
TOWER CARTRIDGE SMART MIX (DISPOSABLE) ×2 IMPLANT
TRAY FOLEY CATH 14FRSI W/METER (CATHETERS) ×2 IMPLANT
WATER STERILE IRR 1500ML POUR (IV SOLUTION) ×2 IMPLANT
WRAP KNEE MAXI GEL POST OP (GAUZE/BANDAGES/DRESSINGS) ×2 IMPLANT

## 2012-06-04 NOTE — Anesthesia Preprocedure Evaluation (Addendum)
Anesthesia Evaluation  Patient identified by MRN, date of birth, ID band Patient awake    Reviewed: Allergy & Precautions, H&P , NPO status , Patient's Chart, lab work & pertinent test results, reviewed documented beta blocker date and time   Airway Mallampati: II TM Distance: >3 FB Neck ROM: Full    Dental  (+) Dental Advisory Given and Teeth Intact   Pulmonary neg pulmonary ROS,  breath sounds clear to auscultation  Pulmonary exam normal       Cardiovascular hypertension, Pt. on medications and Pt. on home beta blockers Rhythm:Regular Rate:Normal     Neuro/Psych negative neurological ROS  negative psych ROS   GI/Hepatic negative GI ROS, Neg liver ROS,   Endo/Other  negative endocrine ROS  Renal/GU negative Renal ROS     Musculoskeletal  (+) Arthritis -, Osteoarthritis,    Abdominal (+) + obese,   Peds  Hematology negative hematology ROS (+)   Anesthesia Other Findings   Reproductive/Obstetrics                         Anesthesia Physical Anesthesia Plan  ASA: II  Anesthesia Plan: Spinal   Post-op Pain Management:    Induction:   Airway Management Planned:   Additional Equipment:   Intra-op Plan:   Post-operative Plan:   Informed Consent: I have reviewed the patients History and Physical, chart, labs and discussed the procedure including the risks, benefits and alternatives for the proposed anesthesia with the patient or authorized representative who has indicated his/her understanding and acceptance.   Dental advisory given  Plan Discussed with: CRNA  Anesthesia Plan Comments:         Anesthesia Quick Evaluation

## 2012-06-04 NOTE — Anesthesia Procedure Notes (Signed)
Spinal  Patient location during procedure: OR Start time: 06/04/2012 7:57 AM End time: 06/04/2012 8:04 AM Staffing Anesthesiologist: Nolon Nations R Performed by: anesthesiologist  Preanesthetic Checklist Completed: patient identified, site marked, surgical consent, pre-op evaluation, timeout performed, IV checked, risks and benefits discussed and monitors and equipment checked Spinal Block Patient position: sitting Prep: ChloraPrep Patient monitoring: heart rate, continuous pulse ox and blood pressure Approach: right paramedian Location: L3-4 Injection technique: single-shot Needle Needle type: Sprotte  Needle gauge: 24 G Needle length: 9 cm Needle insertion depth: 8 cm Assessment Sensory level: T6 Additional Notes Expiration date of kit checked and confirmed. Patient tolerated procedure well, without complications.

## 2012-06-04 NOTE — Plan of Care (Signed)
Problem: Consults Goal: Diagnosis- Total Joint Replacement Left total knee     

## 2012-06-04 NOTE — H&P (Signed)
NAMESETHE, GALYEN NO.:  [id number]  MEDICAL RECORD NO.:  [id number]  LOCATION:  [facility]                        FACILITY:  [facility]  PHYSICIAN:  Cynda Familia, M.D.DATE OF BIRTH:  July 04, 1956  DATE OF ADMISSION:  03/10/2012 DATE OF DISCHARGE:                             HISTORY & PHYSICAL   HISTORY OF PRESENT ILLNESS:  The patient is a 56 year old gentleman well- known to Dr. [name] for evaluation and treatment of bilateral knee pain.  The patient's evaluations included multiple x-rays, arthroscopies of bilateral knees.  He has undergone Visco supplement injections, physical therapy, cortisone injections, yet the patient continues to have significant discomfort in bilateral knees limiting his activities of daily living.  He has also been to the point where he has been unable to work due to the pain in bilateral knees.  His left knee is worse than the right.  The patient's recent evaluation reveals x-rays that shows that he is bone on bone medial compartment with varus deformity.  He has osteophyte formation in the medial and lateral femoral condyles.  He has also got severe patellofemoral degenerative changes with osteophyte formations of the superior-inferior pole of the patella, irregular patellofemoral surfaces with osteophyte formation of the superior anterior femoral condyles bone-on-bone of the femoral tibial junction on lateral views with posterior compartment osteophyte formation.  He also has similar findings of his right knee.  Once again, the patient has failed cortisone Visco supplement, physical therapy, and oral medications.  The patient had a long discussion with Dr. [name] about his knees and discussions included total knee replacement.  The patient has elected to proceed this route.  He understands the pros, cons, possible complications, and the postoperative course.  The patient has been medically cleared by his primary care physician,  Dr. [name] for this upcoming surgical procedure.  ALLERGIES:  None.  CURRENT MEDICATIONS: 1. Lipitor 20 mg once a day. 2. Lisinopril 10 mg once a day. 3. Hydrochlorothiazide 25 mg once a day. 4. Atenolol 100 mg once a day. 5. Amlodipine 10 mg once a day. 6. Vitamin C. 7. One-A-Day Men's Vitamins. 8. Aleve. 9. Fish oil. 10.Glucosamine chondroitin.  Primary care physician is Dr. [name].  CURRENT MEDICAL HISTORY: 1. Hypertension. 2. Hypercholesterolemia. 3. Osteoarthritis of bilateral knees. 4. Obesity with a BMI of 39.5.  REVIEW OF SYSTEMS:  NEUROLOGIC:  He denies any strokes, seizures, convulsions.  No numbness or tingling, muscle weakness, loss of hearing, vision, or memory, drug or alcohol problems.  PULMONARY:  Denies any shortness of breath, productive cough, wheezing.  No history of asthma, pneumonia, COPD, sleep apnea, or tuberculosis.  CARDIOVASCULAR:  He has had a stress test greater than 10 years previous.  He does have stable hypertension, hypercholesterolemia on this current medications.  He denies any recent chest pain, pressure, shortness of breath with activities, PND.  He denies any sense of heart rate beating too fast, too slow, skipping beats.  GI:  He denies any problems with ulcers, unusual indigestion, constipation, diarrhea, jaundice, diverticular problems, hepatitis, gallbladder issues, or ulcers.  GU:  He denies any problems of urinating, no history of kidney stones, BPH, urinary tract infections.  ENDOCRINE:  He denies any unusual  sense of too cold, too hot, excessive thirst, urination, thyroid, or diabetic issues.  PAST SURGICAL HISTORY:  History of hernia repair in 1993.  He has had right and left knee scopes in the past without any complications with anesthesia in the past.  FAMILY MEDICAL HISTORY:  Father is deceased from heart disease.  Mother is alive with diabetes at the age of 17.  SOCIAL HISTORY:  The patient is married.   He works for the [employer]  He has never smoked.  No alcohol or drugs.  He has 2 grown children.  Lives with his wife who will we be providing care for him postop for the 1st week.  PHYSICAL EXAMINATION:  VITAL SIGNS:  Height is 5 feet 7 inches, his weight is 235, blood pressure is 152/102.  He is very nervous today. Pulse of 80 and regular.  Respirations 12 and nonlabored.  He is afebrile. GENERAL:  This is a healthy-appearing well-developed centrally obese gentleman, conscious, alert and appropriate.  Walks with a fairly easy balanced gait.  Has obvious varus deformities of bilateral knees. NECK:  Supple.  No palpable lymphadenopathy. CHEST:  Lung sounds were clear throughout. HEART:  Regular rate and rhythm.  No murmurs. EXTREMITIES:  Good range of motion of upper extremities without any difficulty.  Lower extremities had good range of motion.  Both hips without any difficulty.  His right knee had a varus deformity.  He was able to fully extend it and flex it back to about 115 degrees. He had soft medial joint.  He had no ligament instability.  His calf is soft, nontender.  Good pulses.  No edema in the lower extremity.  His left knee, he had a very varus deformity.  He was able to fully extend and flex it back to about 110 degrees.  He was tender in medial joint, no ligament instability.  Crepitus in knee with motion.  His calf was soft, nontender.  No signs of phlebitis or edema.  Good pulses in the ankle. Good sensation. ABDOMEN:  Soft, nontender.  Bowel sounds present.  Round, obese like. BREASTS:  Deferred. RECTAL:  Deferred. GU:  Deferred.  IMPRESSION: 1. End-stage osteoarthritis, bilateral knees, with bone-on-bone medial     compartment varus deformities with bone-on-bone patellofemoral     joint with large osteophyte formation of the patellofemoral and of     the medial femoral joint.  Bilateral knees, left more symptomatic     than right. 2.  Hypertension. 3. Hypercholesterolemia. 4. Obesity.  PLAN:  The patient will undergo all routine labs and tests prior to having a left total knee arthroplasty by Dr. [name] at [facility] on June 04, 2012.  The patient will undergo routine postoperative course.  The patient questions whether or not he may need skilled nursing due to the fact that his wife want to be home for the 1st of the week, and I did review this with him about his progress and home health physical therapy.     Evert Kohl, P.A.   ______________________________ Cynda Familia, M.D.    RWK/MEDQ  D:  05/31/2012  T:  06/01/2012  Job:  [id number]

## 2012-06-04 NOTE — Op Note (Signed)
DATE OF SURGERY:  06/04/2012  TIME: 9:56 AM  PATIENT NAME:  Marvin Garcia    AGE: 56 y.o.   PRE-OPERATIVE DIAGNOSIS:  left knee osteoarthritis  POST-OPERATIVE DIAGNOSIS:  left knee osteoarthritis  PROCEDURE:  Procedure(s): TOTAL KNEE ARTHROPLASTY  SURGEON:  Earmon Sherrow ANDREW  ASSISTANT:  Rutherford Limerick, PA-C, present and scrubbed throughout the case, critical for assistance with exposure, retraction, instrumentation, and closure.  OPERATIVE IMPLANTS: Depuy PFC Sigma Rotating Platform.  Femur size 4, Tibia size 4, Patella size 38 3-peg oval button, with a 12.5 mm polyethylene insert.   PREOPERATIVE INDICATIONS:   Marvin Garcia is a 56 y.o. year old male with end stage bone on bone arthritis of the knee who failed conservative treatment and elected for Total Knee Arthroplasty.   The risks, benefits, and alternatives were discussed at length including but not limited to the risks of infection, bleeding, nerve injury, stiffness, blood clots, the need for revision surgery, cardiopulmonary complications, among others, and they were willing to proceed.  OPERATIVE DESCRIPTION:  The patient was brought to the operative room and placed in a supine position.  Spinal anesthesia was administered.  IV antibiotics were given.  The lower extremity was prepped and draped in the usual sterile fashion.  Time out was performed.  The leg was elevated and exsanguinated and the tourniquet was inflated.  Anterior quadriceps tendon splitting approach was performed.  The patella was retracted and osteophytes were removed.  The anterior horn of the medial and lateral meniscus was removed and cruciate ligaments resected.   The distal femur was opened with the drill and the intramedullary distal femoral cutting jig was utilized, set at 5 degrees resecting 10 mm off the distal femur.  Care was taken to protect the collateral ligaments.  The distal femoral sizing jig was applied, taking care to avoid  notching.  Then the 4-in-1 cutting jig was applied and the anterior and posterior femur was cut, along with the chamfer cuts.    Then the extramedullary tibial cutting jig was utilized making the appropriate cut using the anterior tibial crest as a reference building in appropriate posterior slope.  Care was taken during the cut to protect the medial and collateral ligaments.  The proximal tibia was removed along with the posterior horns of the menisci.   The posterior medial femoral osteophytes and posterior lateral femoral osteophytes were removed.    The flexion gap was then measured and was symmetric with the extension gap, measured at 10.  I completed the distal femoral preparation using the appropriate jig to prepare the box.  The patella was then measured, and cut with the saw.    The proximal tibia sized and prepared accordingly with the reamer and the punch, and then all components were trialed with the trial insert.  The knee was found to have excellent balance and full motion.    The above named components were then cemented into place and all excess cement was removed.  The trial polyethylene component was in place during cementation, and then was exchanged for the real polyethylene component.    The knee was easily taken through a range of motion and the patella tracked well and the knee irrigated copiously and the parapatellar and subcutaneous tissue closed with vicryl, and monocryl with steri strips for the skin.  The arthrotomy was closed at 90 of flexion. The wounds were dressed with sterile gauze and the tourniquet released and the patient was awakened and returned to the PACU in stable and  satisfactory condition.  There were no complications.  Total tourniquet time was 85 minutes.Vlock suture on capsule. Perosteal injection with 50cc 0.25 percent marcaine and 30 mg of toradol.

## 2012-06-04 NOTE — Transfer of Care (Signed)
Immediate Anesthesia Transfer of Care Note  Patient: Marvin Garcia  Procedure(s) Performed: Procedure(s) (LRB) with comments: TOTAL KNEE ARTHROPLASTY (Left)  Patient Location: PACU  Anesthesia Type: Spinal  Level of Consciousness: awake, alert , oriented and patient cooperative  Airway & Oxygen Therapy: Patient Spontanous Breathing and Patient connected to face mask oxygen  Post-op Assessment: Report given to PACU RN and Post -op Vital signs reviewed and stable  Post vital signs: Reviewed and stable  Complications: No apparent anesthesia complications

## 2012-06-04 NOTE — Anesthesia Postprocedure Evaluation (Signed)
Anesthesia Post Note  Patient: Marvin Garcia  Procedure(s) Performed: Procedure(s) (LRB): TOTAL KNEE ARTHROPLASTY (Left)  Anesthesia type: Spinal  Patient location: PACU  Post pain: Pain level controlled  Post assessment: Post-op Vital signs reviewed  Last Vitals: BP 142/87  Pulse 64  Temp 36.9 C  Resp 12  SpO2 100%  Post vital signs: Reviewed  Level of consciousness: sedated  Complications: Incomplete spinal blockade in OR. Pt comfortable and remained under MAC without issue. No apparent anesthesia complications

## 2012-06-04 NOTE — Evaluation (Signed)
Physical Therapy Evaluation Patient Details Name: Marvin Garcia MRN: [id number] DOB: [birth year] Today's Date: 06/04/2012 Time: [id number] PT Time Calculation (min): 21 min  PT Assessment / Plan / Recommendation Clinical Impression  Pt s/p L TKR. Pt seen POD #0 and agreeable for mobility.  Pt would benefit from acute PT services in order to improve independence with transfers, ambulation and step by increasing L LE strength and ROM to prepare for d/c home with spouse.    PT Assessment  Patient needs continued PT services    Follow Up Recommendations  Home health PT    Does the patient have the potential to tolerate intense rehabilitation      Barriers to Discharge        Equipment Recommendations  Rolling walker with 5" wheels;3 in 1 bedside comode    Recommendations for Other Services     Frequency 7X/week    Precautions / Restrictions Precautions Precautions: Knee Required Braces or Orthoses: Knee Immobilizer - Left Knee Immobilizer - Left: On except when in CPM Restrictions LLE Weight Bearing: Weight bearing as tolerated   Pertinent Vitals/Pain Pt reports no pain.  Repositioned in recliner.      Mobility  Bed Mobility Bed Mobility: Supine to Sit Supine to Sit: 4: Min assist;HOB elevated Details for Bed Mobility Assistance: verbal cues for technique, assist for L LE Transfers Transfers: Stand to Sit;Sit to Stand Sit to Stand: 4: Min guard;With upper extremity assist;From bed Stand to Sit: 4: Min guard;With upper extremity assist;To chair/3-in-1 Details for Transfer Assistance: verbal cues for safe technique Ambulation/Gait Ambulation/Gait Assistance: 4: Min guard Ambulation Distance (Feet): 80 Feet Assistive device: Rolling walker Ambulation/Gait Assistance Details: verbal cues for sequence and RW placement, increased cues for safe RW distance Gait Pattern: Step-to pattern;Antalgic    Shoulder Instructions     Exercises     PT Diagnosis: Difficulty  walking  PT Problem List: Decreased strength;Decreased range of motion;Decreased mobility;Decreased knowledge of use of DME;Decreased knowledge of precautions PT Treatment Interventions: DME instruction;Gait training;Stair training;Functional mobility training;Therapeutic activities;Therapeutic exercise;Patient/family education   PT Goals Acute Rehab PT Goals PT Goal Formulation: With patient Time For Goal Achievement: 06/11/12 Potential to Achieve Goals: Good Pt will go Supine/Side to Sit: with modified independence PT Goal: Supine/Side to Sit - Progress: Goal set today Pt will go Sit to Supine/Side: with modified independence PT Goal: Sit to Supine/Side - Progress: Goal set today Pt will go Sit to Stand: with modified independence PT Goal: Sit to Stand - Progress: Goal set today Pt will go Stand to Sit: with modified independence PT Goal: Stand to Sit - Progress: Goal set today Pt will Ambulate: 51 - 150 feet;with modified independence;with least restrictive assistive device PT Goal: Ambulate - Progress: Goal set today Pt will Go Up / Down Stairs: 1-2 stairs;with supervision;with least restrictive assistive device PT Goal: Up/Down Stairs - Progress: Goal set today Pt will Perform Home Exercise Program: with supervision, verbal cues required/provided PT Goal: Perform Home Exercise Program - Progress: Goal set today  Visit Information  Last PT Received On: 06/04/12 Assistance Needed: +1    Subjective Data  Subjective: Oh good.  I'm getting restless in this bed.   Prior Functioning  Home Living Lives With: Spouse Type of Home: House Home Access: Stairs to enter Technical brewer of Steps: 1 Entrance Stairs-Rails: None Home Layout: One level Home Adaptive Equipment: None Prior Function Level of Independence: Independent Communication Communication: No difficulties    Cognition  Overall Cognitive Status: Appears within functional  limits for tasks  assessed/performed Arousal/Alertness: Awake/alert Orientation Level: Appears intact for tasks assessed Behavior During Session: [facility] for tasks performed    Extremity/Trunk Assessment Right Upper Extremity Assessment RUE ROM/Strength/Tone: [facility] for tasks assessed (reports shoulder pops sometimes (from younger volleyball yrs) Left Upper Extremity Assessment LUE ROM/Strength/Tone: WFL for tasks assessed Right Lower Extremity Assessment RLE ROM/Strength/Tone: [facility] for tasks assessed Left Lower Extremity Assessment LLE ROM/Strength/Tone: Deficits LLE ROM/Strength/Tone Deficits: pt reports no numbness in LE, fair quad contraction, maintained KI   Balance    End of Session PT - End of Session Equipment Utilized During Treatment: Left knee immobilizer Activity Tolerance: Patient tolerated treatment well Patient left: in chair;with call bell/phone within reach Nurse Communication: Mobility status  GP     Niraj Kudrna,KATHrine E 06/04/2012, 4:25 PM Pager: [phone]

## 2012-06-05 LAB — CBC
HCT: 39.6 % (ref 39.0–52.0)
Hemoglobin: 14.2 g/dL (ref 13.0–17.0)
MCHC: 35.9 g/dL (ref 30.0–36.0)

## 2012-06-05 LAB — BASIC METABOLIC PANEL
BUN: 14 mg/dL (ref 6–23)
Chloride: 97 mEq/L (ref 96–112)
GFR calc non Af Amer: 52 mL/min — ABNORMAL LOW (ref >90)
Glucose, Bld: 125 mg/dL — ABNORMAL HIGH (ref 70–99)
Potassium: 3.4 mEq/L — ABNORMAL LOW (ref 3.5–5.1)

## 2012-06-05 MED ORDER — POTASSIUM CHLORIDE CRYS ER 20 MEQ PO TBCR: 20.0000 meq | EXTENDED_RELEASE_TABLET | Freq: Two times a day (BID) | ORAL | Status: DC

## 2012-06-05 MED ORDER — POTASSIUM CHLORIDE CRYS ER 20 MEQ PO TBCR
20.0000 meq | EXTENDED_RELEASE_TABLET | Freq: Two times a day (BID) | ORAL | Status: AC
  Administered 2012-06-05 (×2): 20 meq via ORAL
  Filled 2012-06-05 (×2): qty 1

## 2012-06-05 NOTE — Progress Notes (Signed)
Physical Therapy Treatment Patient Details Name: Marvin Garcia MRN: [id number] DOB: 1956-07-21 Today's Date: 06/05/2012 Time: [id number] PT Time Calculation (min): 23 min  PT Assessment / Plan / Recommendation Comments on Treatment Session       Follow Up Recommendations  Home health PT     Does the patient have the potential to tolerate intense rehabilitation     Barriers to Discharge        Equipment Recommendations  3 in 1 bedside comode;Rolling walker with 5" wheels    Recommendations for Other Services OT consult  Frequency 7X/week   Plan Discharge plan remains appropriate    Precautions / Restrictions Precautions Precautions: Knee Required Braces or Orthoses: Knee Immobilizer - Left Knee Immobilizer - Left: On except when in CPM Restrictions Weight Bearing Restrictions: No LLE Weight Bearing: Weight bearing as tolerated   Pertinent Vitals/Pain 3/10    Mobility  Bed Mobility Bed Mobility: Sit to Supine Sit to Supine: 4: Min assist Details for Bed Mobility Assistance: min VC for sequence; min assist with L LE Transfers Transfers: Stand to Sit;Sit to Stand Sit to Stand: 5: Supervision;From chair/3-in-1;With armrests Stand to Sit: 4: Min guard;To bed;With upper extremity assist Details for Transfer Assistance: verbal cues for safe technique Ambulation/Gait Ambulation/Gait Assistance: 4: Min guard Ambulation Distance (Feet): 115 Feet (twice) Assistive device: Rolling walker Ambulation/Gait Assistance Details: min VC for posture and position from RW Gait Pattern: Step-to pattern    Exercises     PT Diagnosis:    PT Problem List:   PT Treatment Interventions:     PT Goals Acute Rehab PT Goals PT Goal Formulation: With patient Time For Goal Achievement: 06/11/12 Potential to Achieve Goals: Good Pt will go Supine/Side to Sit: with modified independence PT Goal: Supine/Side to Sit - Progress: Progressing toward goal Pt will go Sit to Supine/Side: with  modified independence PT Goal: Sit to Supine/Side - Progress: Progressing toward goal Pt will go Sit to Stand: with modified independence PT Goal: Sit to Stand - Progress: Progressing toward goal Pt will go Stand to Sit: with modified independence PT Goal: Stand to Sit - Progress: Progressing toward goal Pt will Ambulate: 51 - 150 feet;with modified independence;with least restrictive assistive device PT Goal: Ambulate - Progress: Progressing toward goal Pt will Go Up / Down Stairs: 1-2 stairs;with supervision;with least restrictive assistive device PT Goal: Up/Down Stairs - Progress: Progressing toward goal Pt will Perform Home Exercise Program: with supervision, verbal cues required/provided PT Goal: Perform Home Exercise Program - Progress: Progressing toward goal  Visit Information  Last PT Received On: 06/05/12 Assistance Needed: +1    Subjective Data  Subjective: I'm ready Patient Stated Goal: Resume previous lifestyle with decreased pain   Cognition  Overall Cognitive Status: Appears within functional limits for tasks assessed/performed Arousal/Alertness: Awake/alert Orientation Level: Appears intact for tasks assessed Behavior During Session: [facility] for tasks performed    Balance     End of Session PT - End of Session Activity Tolerance: Patient tolerated treatment well Patient left: in bed;with call bell/phone within reach Nurse Communication: Mobility status CPM Left Knee CPM Left Knee: On Left Knee Flexion (Degrees): 60  Left Knee Extension (Degrees): 0    GP     Marvin Garcia 06/05/2012, 3:47 PM

## 2012-06-05 NOTE — Plan of Care (Signed)
Problem: Consults Goal: Diagnosis- Total Joint Replacement Outcome: Completed/Met Date Met:  06/05/12 Primary Total Knee LEFT

## 2012-06-05 NOTE — Progress Notes (Signed)
Subjective: Patient is a bedside chair working with physical therapy he denies any complaints was able to eat this morning without difficulty his pain medicine working well no shortness of breath chest pains.   Objective: Vital signs in last 24 hours: Temp:  [97.5 F (36.4 C)-98.9 F (37.2 C)] 98.9 F (37.2 C) (10/26 0559) Pulse Rate:  [59-71] 65  (10/26 0559) Resp:  [10-17] 16  (10/26 0800) BP: (119-150)/(75-96) 148/88 mmHg (10/26 0559) SpO2:  [95 %-100 %] 98 % (10/26 0559) Weight:  [110.678 kg (244 lb)] 110.678 kg (244 lb) (10/25 1130)  Intake/Output from previous day: 10/25 0701 - 10/26 0700 In: 5372.5 [P.O.:980; I.V.:3987.5; IV Piggyback:405] Out: [id number] [Urine:3700; Drains:217; Blood:150] Intake/Output this shift: Total I/O In: -  Out: 150 [Urine:150]   Basename 06/05/12 0550  HGB 14.2    Basename 06/05/12 0550  WBC 13.9*  RBC 5.42  HCT 39.6  PLT 266    Basename 06/05/12 0550  NA 136  K 3.4*  CL 97  CO2 30  BUN 14  CREATININE 1.47*  GLUCOSE 125*  CALCIUM 9.0   No results found for this basename: LABPT:2,INR:2 in the last 72 hours  Patient's conscious alert appropriate appears to be very comfortable in a bedside chair. His lungs were clear throughout his heart was regular rate and rhythm his abdomen soft nontender. His left leg dressing was intact his Hemovac drain was DC'd intact his calf was soft nontender his leg was neuromotor vascularly intact  Assessment/Plan: Postop day #1 status post left total knee arthroplasty doing very well Mild hypokalemia despite IV fluids its potassium will supplement by mouth x2 doses and rechecked in the morning  Plan continue with physical therapy per protocol will start CPM today. Cutback IV hydration. Potassium x2 doses. Recheck labs in the morning. Dressing change in the morning. If doing well will be able to DC home Sunday or Monday with home health physical therapy    Evert Kohl 06/05/2012, 8:16 AM

## 2012-06-05 NOTE — Progress Notes (Signed)
Physical Therapy Treatment Patient Details Name: Marvin Garcia MRN: [id number] DOB: July 12, 1956 Today's Date: 06/05/2012 Time: [id number] PT Time Calculation (min): 40 min  PT Assessment / Plan / Recommendation Comments on Treatment Session       Follow Up Recommendations  Home health PT     Does the patient have the potential to tolerate intense rehabilitation     Barriers to Discharge        Equipment Recommendations  3 in 1 bedside comode;Rolling walker with 5" wheels    Recommendations for Other Services OT consult  Frequency 7X/week   Plan Discharge plan remains appropriate    Precautions / Restrictions Precautions Precautions: Knee Required Braces or Orthoses: Knee Immobilizer - Left Knee Immobilizer - Left: On except when in CPM (Pt performed IND SLR this am) Restrictions Weight Bearing Restrictions: No LLE Weight Bearing: Weight bearing as tolerated   Pertinent Vitals/Pain     Mobility  Bed Mobility Bed Mobility:  (Pt states does not want to get into bed 2* comfort issues) Transfers Transfers: Stand to Sit;Sit to Stand Sit to Stand: 5: Supervision;With upper extremity assist;From chair/3-in-1 Stand to Sit: 5: Supervision;Without upper extremity assist;To chair/3-in-1 Details for Transfer Assistance: verbal cues for safe technique Ambulation/Gait Ambulation/Gait Assistance: 4: Min guard Ambulation Distance (Feet): 123 Feet Assistive device: Rolling walker Ambulation/Gait Assistance Details: min cues for sequence, posture, position from RW and stride length Gait Pattern: Step-to pattern    Exercises Total Joint Exercises Ankle Circles/Pumps: AROM;Both;20 reps;Supine Quad Sets: AROM;20 reps;Supine;Both Heel Slides: AAROM;15 reps;Supine;Left Straight Leg Raises: AAROM;20 reps;Supine;Left   PT Diagnosis:    PT Problem List:   PT Treatment Interventions:     PT Goals Acute Rehab PT Goals PT Goal Formulation: With patient Time For Goal Achievement:  06/11/12 Potential to Achieve Goals: Good Pt will go Supine/Side to Sit: with modified independence Pt will go Sit to Stand: with modified independence PT Goal: Sit to Stand - Progress: Progressing toward goal Pt will go Stand to Sit: with modified independence PT Goal: Stand to Sit - Progress: Progressing toward goal Pt will Ambulate: 51 - 150 feet;with modified independence;with least restrictive assistive device PT Goal: Ambulate - Progress: Progressing toward goal Pt will Go Up / Down Stairs: 1-2 stairs;with supervision;with least restrictive assistive device Pt will Perform Home Exercise Program: with supervision, verbal cues required/provided PT Goal: Perform Home Exercise Program - Progress: Progressing toward goal  Visit Information  Last PT Received On: 06/05/12 Assistance Needed: +1    Subjective Data  Subjective: I'm ready Patient Stated Goal: Resume previous lifestyle with decreased pain   Cognition  Overall Cognitive Status: Appears within functional limits for tasks assessed/performed Arousal/Alertness: Awake/alert Orientation Level: Appears intact for tasks assessed Behavior During Session: [facility] for tasks performed    Balance  Balance Balance Assessed: Yes Static Standing Balance Static Standing - Balance Support: Right upper extremity supported;Left upper extremity supported Static Standing - Level of Assistance: 5: Stand by assistance  End of Session PT - End of Session Activity Tolerance: Patient tolerated treatment well Patient left: in chair;with call bell/phone within reach Nurse Communication: Mobility status   GP     Marvin Garcia 06/05/2012, 1:53 PM

## 2012-06-05 NOTE — Care Management Note (Signed)
    Page 1 of 2   06/07/2012     2:41:31 PM   CARE MANAGEMENT NOTE 06/07/2012  Patient:  Marvin Garcia, Marvin Garcia   Account Number:  [id number]  Date Initiated:  06/05/2012  Documentation initiated by:  Dessa Phi  Subjective/Objective Assessment:   ADMITTED W/L TKA.     Action/Plan:   FROM HOME   Anticipated DC Date:  06/07/2012   Anticipated DC Plan:  [place]  In-house referral  NA      DC Planning Services  CM consult      PAC Choice  [place]   Choice offered to / List presented to:  C-1 Patient   DME arranged  3-N-1  Vassie Moselle      DME agency  [place] arranged  HH-2 PT      [facility] agency  [facility]   Status of service:  Completed, signed off Medicare Important Message given?  NO (If response is "NO", the following Medicare IM given date fields will be blank) Date Medicare IM given:   Date Additional Medicare IM given:    Discharge Disposition:  [place]  Per UR Regulation:  Reviewed for med. necessity/level of care/duration of stay  If discussed at [place] of Stay Meetings, dates discussed:    Comments:  06/07/2012 Kem Kays RN CCM [phone] Cm spoke with patient. Plans are for him to return to his home in [place] where spouse will be caregiver. He will need RW.  CPM FOR HOME USE has already been ordered from [employer] by doctor's office prior to hospital admission. Pt's states he has already been contacted regarding delivery of CPM at his home upon his discharge. [facility] will provide [facility] services with start date tomorrow 06/08/2012. Pt discharged today.;Cotact information given to pt's spouse regarding [place] agency.  06/05/12 KATHY MAHABIR RN,BSN NCM [place] LIST.[place].HHPT,3N1,RW RECOMMENDED.

## 2012-06-05 NOTE — Evaluation (Signed)
Occupational Therapy Evaluation Patient Details Name: Marvin Garcia MRN: [id number] DOB: [birth year] Today's Date: 06/05/2012 Time: [id number] OT Time Calculation (min): 20 min  OT Assessment / Plan / Recommendation Clinical Impression  Pleasant 56yr old male admitted for elective left TKA.  Pt presents with decreased overall independence with mobility and selfcare at a supevision to min assist level.  Feel he will prgress nicely, and will have 24 hour supervision from his wife at discharge.  Will need 3:1 for home.  No further OT needs at this time.       Follow Up Recommendations  No OT follow up       Equipment Recommendations  3 in 1 bedside comode;Rolling walker with 5" wheels          Precautions / Restrictions Precautions Precautions: Knee Restrictions Weight Bearing Restrictions: No LLE Weight Bearing: Weight bearing as tolerated   Pertinent Vitals/Pain No report of pain when asked    ADL  Eating/Feeding: Simulated;Independent Where Assessed - Eating/Feeding: Chair Grooming: Simulated;Supervision/safety Where Assessed - Grooming: Unsupported standing Upper Body Bathing: Simulated;Set up Where Assessed - Upper Body Bathing: Unsupported sitting Lower Body Bathing: Simulated;Minimal assistance Where Assessed - Lower Body Bathing: Supported standing Upper Body Dressing: Simulated;Set up Where Assessed - Upper Body Dressing: Unsupported sitting Lower Body Dressing: Simulated;Minimal assistance Where Assessed - Lower Body Dressing: Supported sit to stand Toilet Transfer: Chartered loss adjuster Method: Arts development officer: Therapist, occupational and Hygiene: Simulated;Supervision/safety Where Assessed - Best boy and Hygiene: Sit to stand from 3-in-1 or toilet Tub/Shower Transfer Method: Not assessed Equipment Used: Rolling walker Transfers/Ambulation Related to ADLs: Pt is  overall supervision for mobility to and from the bathroom with his RW. ADL Comments: Pt with slight difficulty reaching his LLE but will have assistance from family until he can do it on his own.  Do not feel he will need AE after a few days, so do not recommend at this time.  Do feel he will benefit from a 3:1 however for use over his toilet.      Visit Information  Last OT Received On: 06/05/12 Assistance Needed: +1    Subjective Data  Subjective: "I'll wait and see how it goes." (regarding the tub bench) Patient Stated Goal: Did not state but agreeable to participate in therapy.   Prior Functioning     Home Living Lives With: Spouse Type of Home: House Home Access: Stairs to enter Technical brewer of Steps: 1 Entrance Stairs-Rails: None Home Layout: One level Bathroom Shower/Tub: Product/process development scientist: Standard Bathroom Accessibility: Yes Home Adaptive Equipment: None Prior Function Level of Independence: Independent Driving: Yes Communication Communication: No difficulties Dominant Hand: Right         Vision/Perception Vision - Assessment Eye Alignment: Within Functional Limits Vision Assessment: Vision not tested Perception Perception: Within Functional Limits Praxis Praxis: Intact   Cognition  Overall Cognitive Status: Appears within functional limits for tasks assessed/performed Arousal/Alertness: Awake/alert Orientation Level: Appears intact for tasks assessed Behavior During Session: [facility] for tasks performed    Extremity/Trunk Assessment Right Upper Extremity Assessment RUE ROM/Strength/Tone: Within functional levels RUE Sensation: WFL - Light Touch RUE Coordination: WFL - gross/fine motor Left Upper Extremity Assessment LUE ROM/Strength/Tone: Within functional levels LUE Sensation: WFL - Light Touch LUE Coordination: WFL - gross/fine motor     Mobility Transfers Transfers: Sit to Stand Sit to Stand: 5: Supervision;With  upper extremity assist;From chair/3-in-1 Stand to Sit: 5: Supervision;Without upper extremity assist;To chair/3-in-1  Balance Balance Balance Assessed: Yes Static Standing Balance Static Standing - Balance Support: Right upper extremity supported;Left upper extremity supported Static Standing - Level of Assistance: 5: Stand by assistance   End of Session OT - End of Session Activity Tolerance: Patient tolerated treatment well Patient left: in chair;with call bell/phone within reach Nurse Communication: Mobility status     Henning OTR/L Pager number [phone] 06/05/2012, 10:21 AM

## 2012-06-06 LAB — CBC
HCT: 35.9 % — ABNORMAL LOW (ref 39.0–52.0)
MCV: 72.7 fL — ABNORMAL LOW (ref 78.0–100.0)
RBC: 4.94 MIL/uL (ref 4.22–5.81)
WBC: 14.4 10*3/uL — ABNORMAL HIGH (ref 4.0–10.5)

## 2012-06-06 LAB — BASIC METABOLIC PANEL
BUN: 16 mg/dL (ref 6–23)
CO2: 31 mEq/L (ref 19–32)
Chloride: 97 mEq/L (ref 96–112)
Creatinine, Ser: 1.54 mg/dL — ABNORMAL HIGH (ref 0.50–1.35)

## 2012-06-06 LAB — GLUCOSE, CAPILLARY: Glucose-Capillary: 112 mg/dL — ABNORMAL HIGH (ref 70–99)

## 2012-06-06 MED ORDER — HEPARIN SOD (PORK) LOCK FLUSH 100 UNIT/ML IV SOLN: 500.0000 [IU] | Freq: Once | INTRAVENOUS | Status: DC

## 2012-06-06 NOTE — Progress Notes (Signed)
Physical Therapy Treatment Patient Details Name: Marvin Garcia MRN: [id number] DOB: [birth year] Today's Date: 06/06/2012 Time: [id number] PT Time Calculation (min): 21 min  PT Assessment / Plan / Recommendation Comments on Treatment Session       Follow Up Recommendations  Home health PT     Does the patient have the potential to tolerate intense rehabilitation     Barriers to Discharge        Equipment Recommendations  3 in 1 bedside comode;Rolling walker with 5" wheels    Recommendations for Other Services OT consult  Frequency 7X/week   Plan Discharge plan remains appropriate    Precautions / Restrictions Precautions Precautions: Knee Required Braces or Orthoses: Knee Immobilizer - Left Knee Immobilizer - Left: On except when in CPM Restrictions Weight Bearing Restrictions: No LLE Weight Bearing: Weight bearing as tolerated   Pertinent Vitals/Pain 4/10    Mobility  Bed Mobility Bed Mobility: Supine to Sit Sit to Supine: 4: Min guard;5: Supervision Transfers Transfers: Stand to Sit;Sit to Stand Sit to Stand: 5: Supervision Stand to Sit: 5: Supervision Details for Transfer Assistance: min cues for LE management Ambulation/Gait Ambulation/Gait Assistance: 4: Min guard;5: Supervision Ambulation Distance (Feet): 200 Feet Assistive device: Rolling walker Ambulation/Gait Assistance Details: min cues for posture, position from RW Gait Pattern: Step-to pattern Stairs: Yes Stairs Assistance: 4: Min assist Stairs Assistance Details (indicate cue type and reason): cues for sequence and for foot/RW placement Stair Management Technique: No rails;Step to pattern;Backwards;Forwards Number of Stairs: 1  (once fwd and twice bkwd (did better bkwd))    Exercises     PT Diagnosis:    PT Problem List:   PT Treatment Interventions:     PT Goals Acute Rehab PT Goals PT Goal Formulation: With patient Time For Goal Achievement: 06/11/12 Potential to Achieve Goals:  Good Pt will go Supine/Side to Sit: with modified independence PT Goal: Supine/Side to Sit - Progress: Progressing toward goal Pt will go Sit to Supine/Side: with modified independence PT Goal: Sit to Supine/Side - Progress: Progressing toward goal Pt will go Sit to Stand: with modified independence PT Goal: Sit to Stand - Progress: Progressing toward goal Pt will go Stand to Sit: with modified independence PT Goal: Stand to Sit - Progress: Progressing toward goal Pt will Ambulate: 51 - 150 feet;with modified independence;with least restrictive assistive device PT Goal: Ambulate - Progress: Progressing toward goal Pt will Go Up / Down Stairs: 1-2 stairs;with supervision;with least restrictive assistive device PT Goal: Up/Down Stairs - Progress: Progressing toward goal Pt will Perform Home Exercise Program: with supervision, verbal cues required/provided PT Goal: Perform Home Exercise Program - Progress: Progressing toward goal  Visit Information  Last PT Received On: 06/06/12 Assistance Needed: +1    Subjective Data  Patient Stated Goal: Resume previous lifestyle with decreased pain   Cognition  Overall Cognitive Status: Appears within functional limits for tasks assessed/performed Arousal/Alertness: Awake/alert Orientation Level: Appears intact for tasks assessed Behavior During Session: [facility] for tasks performed    Balance     End of Session PT - End of Session Activity Tolerance: Patient tolerated treatment well Patient left: with call bell/phone within reach;in bed Nurse Communication: Mobility status   GP     Tai Syfert 06/06/2012, 3:55 PM

## 2012-06-06 NOTE — Progress Notes (Signed)
Physical Therapy Treatment Patient Details Name: Marvin Garcia MRN: [id number] DOB: [birth year] Today's Date: 06/06/2012 Time: [id number] PT Time Calculation (min): 38 min  PT Assessment / Plan / Recommendation Comments on Treatment Session       Follow Up Recommendations  Home health PT     Does the patient have the potential to tolerate intense rehabilitation     Barriers to Discharge        Equipment Recommendations  3 in 1 bedside comode;Rolling walker with 5" wheels    Recommendations for Other Services OT consult  Frequency 7X/week   Plan Discharge plan remains appropriate    Precautions / Restrictions Precautions Precautions: Knee Required Braces or Orthoses: Knee Immobilizer - Left Knee Immobilizer - Left: On except when in CPM (Pt performed IND SLR this am) Restrictions Weight Bearing Restrictions: No LLE Weight Bearing: Weight bearing as tolerated   Pertinent Vitals/Pain 4/10; premedicated, ice packs provided    Mobility  Transfers Transfers: Stand to Sit;Sit to Stand Sit to Stand: 5: Supervision Stand to Sit: 5: Supervision Details for Transfer Assistance: min cues for LE management Ambulation/Gait Ambulation/Gait Assistance: 4: Min guard;5: Supervision Ambulation Distance (Feet): 140 Feet (twice) Assistive device: Rolling walker Ambulation/Gait Assistance Details: min cues for posture and position from RW Gait Pattern: Step-to pattern    Exercises Total Joint Exercises Ankle Circles/Pumps: AROM;Both;20 reps;Supine Quad Sets: AROM;20 reps;Supine;Both Heel Slides: AAROM;Supine;Left;20 reps Straight Leg Raises: AAROM;20 reps;Supine;Left Long [employer]: 20 reps;Seated;Both;AAROM;AROM   PT Diagnosis:    PT Problem List:   PT Treatment Interventions:     PT Goals Acute Rehab PT Goals PT Goal Formulation: With patient Time For Goal Achievement: 06/11/12 Potential to Achieve Goals: Good Pt will go Sit to Stand: with modified independence PT Goal:  Sit to Stand - Progress: Progressing toward goal Pt will go Stand to Sit: with modified independence PT Goal: Stand to Sit - Progress: Progressing toward goal Pt will Ambulate: 51 - 150 feet;with modified independence;with least restrictive assistive device PT Goal: Ambulate - Progress: Progressing toward goal Pt will Perform Home Exercise Program: with supervision, verbal cues required/provided PT Goal: Perform Home Exercise Program - Progress: Progressing toward goal  Visit Information  Last PT Received On: 06/06/12 Assistance Needed: +1    Subjective Data  Subjective: You worked me hard today Patient Stated Goal: Resume previous lifestyle with decreased pain   Cognition  Overall Cognitive Status: Appears within functional limits for tasks assessed/performed Arousal/Alertness: Awake/alert Orientation Level: Appears intact for tasks assessed Behavior During Session: [facility] for tasks performed    Balance     End of Session PT - End of Session Activity Tolerance: Patient tolerated treatment well Patient left: with call bell/phone within reach;in chair Nurse Communication: Mobility status   GP     Oliverio Cho 06/06/2012, 12:07 PM

## 2012-06-07 ENCOUNTER — Encounter (HOSPITAL_COMMUNITY): Payer: Self-pay | Admitting: Specialist

## 2012-06-07 ENCOUNTER — Inpatient Hospital Stay (HOSPITAL_COMMUNITY): Payer: 59

## 2012-06-07 LAB — CBC
HCT: 32.4 % — ABNORMAL LOW (ref 39.0–52.0)
Hemoglobin: 11.4 g/dL — ABNORMAL LOW (ref 13.0–17.0)
MCH: 25.3 pg — ABNORMAL LOW (ref 26.0–34.0)
MCHC: 35.2 g/dL (ref 30.0–36.0)
RBC: 4.5 MIL/uL (ref 4.22–5.81)

## 2012-06-07 MED ORDER — METHOCARBAMOL 500 MG PO TABS: 500.0000 mg | ORAL_TABLET | Freq: Four times a day (QID) | ORAL | Status: DC | PRN

## 2012-06-07 MED ORDER — OXYCODONE HCL 5 MG PO TABS: 5.0000 mg | ORAL_TABLET | ORAL | Status: DC | PRN

## 2012-06-07 MED ORDER — ASPIRIN EC 325 MG PO TBEC: 325.0000 mg | DELAYED_RELEASE_TABLET | Freq: Two times a day (BID) | ORAL | Status: DC

## 2012-06-07 NOTE — Progress Notes (Signed)
Physical Therapy Treatment Patient Details Name: Marvin Garcia MRN: [id number] DOB: 01-13-1956 Today's Date: 06/07/2012 Time: 1206-1232 PT Time Calculation (min): 26 min  PT Assessment / Plan / Recommendation Comments on Treatment Session  POD #3 pm session with family education with spouse on negociating stairs, safety with amb using RW and HEP. Also demonstarted and educated on application of TEDS w/ ASO and schedule of both.  Pt plans to D/C to home today.    Follow Up Recommendations  Home health PT     Does the patient have the potential to tolerate intense rehabilitation     Barriers to Discharge        Equipment Recommendations       Recommendations for Other Services    Frequency 7X/week   Plan Discharge plan remains appropriate    Precautions / Restrictions Precautions Precautions: Knee Required Braces or Orthoses: Knee Immobilizer - Left Restrictions Weight Bearing Restrictions: No LLE Weight Bearing: Weight bearing as tolerated    Pertinent Vitals/Pain C/o 8/10 L ankle pain    Mobility  Bed Mobility Bed Mobility: Not assessed Details for Bed Mobility Assistance: Pt OOB in recliner Transfers Transfers: Sit to Stand;Stand to Sit Sit to Stand: 5: Supervision;From chair/3-in-1 Stand to Sit: 5: Supervision;To chair/3-in-1 Details for Transfer Assistance: <25% VC's on safety with hand placement prior to sit and L LE extension   Ambulation/Gait Ambulation/Gait Assistance: 5: Supervision Ambulation Distance (Feet): 75 Feet Assistive device: Rolling walker Ambulation/Gait Assistance Details: pt states L ankle feels better with ASO and was able to perform stairs and amb in hallway with c/o 6/10 pain.  Elevation and ICE applied. Spouse present and instructed on proper tech to get one step backward. Gait Pattern: Step-to pattern;Decreased stance time - left;Trunk flexed Gait velocity: decreased  Stairs: Yes Stairs Assistance: 4: Min guard Stairs Assistance  Details (indicate cue type and reason): with spouse and 50% VC's on proper tech and safety. Stair Management Technique: No rails;Backwards;With walker Number of Stairs: 1     Exercises Total Joint Exercises Ankle Circles/Pumps: AROM;Both;10 reps;Supine Quad Sets: AROM;Both;10 reps;Supine Gluteal Sets: AROM;Both;10 reps;Supine Towel Squeeze: AROM;Both;10 reps;Supine Heel Slides: AAROM;Left;10 reps;Supine Hip ABduction/ADduction: AAROM;Left;10 reps;Supine Straight Leg Raises: AAROM;Left;10 reps;Supine    PT Goals progressing    Visit Information  Last PT Received On: 06/07/12 Assistance Needed: +1    Subjective Data  Subjective: my left ankle hurts Patient Stated Goal: home   Cognition    good   Balance   fair  End of Session PT - End of Session Equipment Utilized During Treatment: Gait belt Activity Tolerance: Patient tolerated treatment well Patient left: in chair;with call bell/phone within reach;with family/visitor present  Rica Koyanagi  PTA [facility]  Acute  Rehab Pager     [phone]

## 2012-06-07 NOTE — Progress Notes (Signed)
Subjective: Patient opted bedside chair reports to be doing well his knee is a little bit sore today after a good day with physical therapy yesterday. Patient denies any shortness breath chest pains no fevers no chills no other complaints other than a little but of soreness in his left ankle.   Objective: Vital signs in last 24 hours: Temp:  [99.2 F (37.3 C)-99.6 F (37.6 C)] 99.3 F (37.4 C) (10/28 0431) Pulse Rate:  [70-82] 70  (10/28 0431) Resp:  [16-17] 16  (10/28 0431) BP: (121-131)/(73-84) 121/79 mmHg (10/28 0431) SpO2:  [93 %-96 %] 96 % (10/28 0431)  Intake/Output from previous day: 10/27 0701 - 10/28 0700 In: 660 [P.O.:660] Out: 2125 [Urine:2125] Intake/Output this shift:     Basename 06/07/12 0432 06/06/12 0428 06/05/12 0550  HGB 11.4* 12.6* 14.2    Basename 06/07/12 0432 06/06/12 0428  WBC 16.4* 14.4*  RBC 4.50 4.94  HCT 32.4* 35.9*  PLT 227 243    Basename 06/06/12 0428 06/05/12 0550  NA 136 136  K 3.2* 3.4*  CL 97 97  CO2 31 30  BUN 16 14  CREATININE 1.54* 1.47*  GLUCOSE 119* 125*  CALCIUM 9.2 9.0   No results found for this basename: LABPT:2,INR:2 in the last 72 hours  Patient is conscious alert and appropriate appears to be very comfortable sitting in a bedside chair. His left lower extremity the knee is well approximated with Steri-Strips there is no pressure blisters no swelling no drainage no erythema his calf and thigh are soft nontender. Patient is a little sore around the ankle joint he has no bruising he is able to dorsi plantar flex the ankle with just mild soreness is good good pulses good sensation no signs of skin breakdown   Assessment/Plan: Postop day #3 status post left total knee arthroplasty doing well with physical therapy and CPM Elevated white count asymptomatic no source of infection probable postoperative stress reaction we will monitor close patient reported: Office if any other issues Plan out of bed with physical therapy  discharge home today with home health physical therapy CPM follow up appointment with Dr. [name] in his office in 2 weeks   [place] Jr W 06/07/2012, 7:02 AM

## 2012-06-07 NOTE — Progress Notes (Signed)
Physical Therapy Treatment Patient Details Name: Marvin Garcia MRN: [id number] DOB: Apr 25, 1956 Today's Date: 06/07/2012 Time: [id number] PT Time Calculation (min): 25 min  PT Assessment / Plan / Recommendation Comments on Treatment Session  POD #3 L TKR am session.  Pt unable to amb or WB thru L LE 2nd max c/o ankle pain. Reported to RN.    Follow Up Recommendations  Home health PT     Does the patient have the potential to tolerate intense rehabilitation     Barriers to Discharge        Equipment Recommendations       Recommendations for Other Services    Frequency 7X/week   Plan Discharge plan remains appropriate    Precautions / Restrictions Precautions Precautions: Knee Required Braces or Orthoses: Knee Immobilizer - Left Restrictions Weight Bearing Restrictions: No LLE Weight Bearing: Weight bearing as tolerated    Pertinent Vitals/Pain C/o 10/10 L ankle pain    Mobility  Bed Mobility Bed Mobility: Not assessed Details for Bed Mobility Assistance: Pt OOB in recliner  Transfers Transfers: Sit to Stand;Stand to Sit Sit to Stand: 5: Supervision;From chair/3-in-1 Stand to Sit: 5: Supervision;To chair/3-in-1 Details for Transfer Assistance: <25% VC's on safety with hand placement prior to sit and L LE extension  Ambulation/Gait Ambulation/Gait Assistance: 4: Min guard Ambulation Distance (Feet): 2 Feet Assistive device: Rolling walker Ambulation/Gait Assistance Details: pt unable to tolerate WB thru L LE with max c/o ankle pain.  reported to RN. Gait Pattern: Step-to pattern    Exercises Total Joint Exercises Ankle Circles/Pumps: AROM;Both;10 reps;Supine Quad Sets: AROM;Both;10 reps;Supine Gluteal Sets: AROM;Both;10 reps;Supine Towel Squeeze: AROM;Both;10 reps;Supine Heel Slides: AAROM;Left;10 reps;Supine Hip ABduction/ADduction: AAROM;Left;10 reps;Supine Straight Leg Raises: AAROM;Left;10 reps;Supine    PT Goals progressing    Visit Information  Last PT Received On: 06/07/12 Assistance Needed: +1    Subjective Data  Subjective: my left ankle hurts Patient Stated Goal: home             End of Session PT - End of Session Activity Tolerance: Patient tolerated treatment well   Rica Koyanagi  PTA [facility]  Acute  Rehab Pager     [phone]

## 2012-06-07 NOTE — Discharge Summary (Signed)
Physician Discharge Summary  Patient ID: Marvin Garcia MRN: [id number] DOB/AGE: 1956/05/23 56 y.o.  Admit date: 06/04/2012 Discharge date: 06/07/2012  Admission Diagnoses: End-stage osteoarthritis bilateral knees left greater than right Hypertension Central obesity  Discharge Diagnoses: Status post left total knee arthroplasty Hypertension stable Elevated white count postop probable stress reaction no source of infection we'll monitor Central obesity Active Problems:  * No active hospital problems. *    Discharged Condition: good  Hospital Course: Patient was admitted [facility] under the care of Dr. Hart Robinsons patient was taken to the OR were a left total knee arthroplasty was performed under spinal anesthesia one Hemovac drain was left in place tourniquet was used patient was transferred to recovery room then to the orthopedic floor in good condition to follow a total knee arthroplasty protocol the patient was able to wean off IV medicines of birth postop day Hemovac drain was DC'd on postop day #1 intact without difficulty patient's vital signs remained stable patient participated with physical therapy he had his dressing changed on postop day #2 is no signs of infection was well approximate Steri-Strips. Patient did well with physical therapy was recommended for home discharge to home with home health physical therapy and CPM on postop day #3 patient's vital signs are stable there was no source of any infection with a slightly elevated white count. This was felt to be related to postoperative stress. We will monitor this closely. Patient will perform a physical therapy one more time and did be discharged home with home health physical therapy  Consults: None  Significant Diagnostic Studies: Routine postop labs  Treatments: Routine total knee arthroplasty  Discharge Exam: Blood pressure 121/79, pulse 70, temperature 99.3 F (37.4 C), temperature source Oral, resp.  rate 16, height 5\' 7"  (1.702 m), weight 110.678 kg (244 lb), SpO2 96.00%. Patient is conscious alert and appropriate sitting up in a bedside chair in no distress no complaints appears to be very comfortable. Left lower extremity her wound is well approximated with Steri-Strips there is no swelling no pressure blisters no erythema no drainage his calf and thigh are soft nontender his little sore around the ankle he has no bruising no erythema fairly good motion good pulses good sensation  Disposition: 01-Home or Self Care  Discharge Orders    Future Orders Please Complete By Expires   Diet general      Call MD / Call 911      Comments:   If you experience chest pain or shortness of breath, CALL 911 and be transported to the hospital emergency room.  If you develope a fever above 101 F, pus (white drainage) or increased drainage or redness at the wound, or calf pain, call your surgeon's office.   Increase activity slowly as tolerated      Discharge instructions      Comments:   Call [phone] for followup appointment with Dr. [name] in 2 weeks. If any other problems prior to her appointment call the office for advice   CPM      Comments:   Continuous passive motion machine (CPM):      Use the CPM from 0 to  60 for 6  hours per day.      You may increase by 10  per day.  You may break it up into 2 or 3 sessions per day.      Use CPM for 2  weeks or until you are told to stop.   TED hose  Comments:   Use stockings (TED hose) for 2 weeks on both leg(s).  You may remove them at night for sleeping.   Change dressing      Comments:   Change dressing daily with sterile 4 x 4 inch gauze dressing and apply TED hose.  You may clean the incision with alcohol prior to redressing.   Do not put a pillow under the knee. Place it under the heel.          Medication List     As of 06/07/2012  7:13 AM    STOP taking these medications         naproxen sodium 220 MG tablet   Commonly known as:  ANAPROX      TAKE these medications         amLODipine 10 MG tablet   Commonly known as: NORVASC   Take 10 mg by mouth every morning.      aspirin EC 325 MG tablet   Take 1 tablet (325 mg total) by mouth 2 (two) times daily.      atenolol 50 MG tablet   Commonly known as: TENORMIN   Take 50 mg by mouth every morning.      atenolol 100 MG tablet   Commonly known as: TENORMIN   Take 100 mg by mouth daily. EVERY AM      atorvastatin 20 MG tablet   Commonly known as: LIPITOR   Take 20 mg by mouth every evening.      hydrochlorothiazide 25 MG tablet   Commonly known as: HYDRODIURIL   Take 25 mg by mouth every morning.      lisinopril 40 MG tablet   Commonly known as: PRINIVIL,ZESTRIL   Take 40 mg by mouth every morning.      methocarbamol 500 MG tablet   Commonly known as: ROBAXIN   Take 1 tablet (500 mg total) by mouth every 6 (six) hours as needed.      multivitamin with minerals Tabs   Take 1 tablet by mouth daily.      OMEGA 3 PO   Take 500 mg by mouth daily.      oxyCODONE 5 MG immediate release tablet   Commonly known as: Oxy IR/ROXICODONE   Take 1-2 tablets (5-10 mg total) by mouth every 3 (three) hours as needed.      vitamin C 500 MG tablet   Commonly known as: ASCORBIC ACID   Take 500 mg by mouth daily.         SignedEvert Kohl 06/07/2012, 7:13 AM

## 2012-06-10 ENCOUNTER — Emergency Department (HOSPITAL_COMMUNITY)
Admission: EM | Admit: 2012-06-10 | Discharge: 2012-06-10 | Disposition: A | Discharge status: Home / Self Care | Payer: 59 | Attending: Emergency Medicine | Admitting: Emergency Medicine

## 2012-06-10 ENCOUNTER — Encounter (HOSPITAL_COMMUNITY): Payer: Self-pay | Admitting: *Deleted

## 2012-06-10 DIAGNOSIS — M129 Arthropathy, unspecified: Secondary | ICD-10-CM | POA: Insufficient documentation

## 2012-06-10 DIAGNOSIS — Z7982 Long term (current) use of aspirin: Secondary | ICD-10-CM | POA: Insufficient documentation

## 2012-06-10 DIAGNOSIS — R066 Hiccough: Secondary | ICD-10-CM

## 2012-06-10 DIAGNOSIS — I10 Essential (primary) hypertension: Secondary | ICD-10-CM | POA: Insufficient documentation

## 2012-06-10 DIAGNOSIS — Z79899 Other long term (current) drug therapy: Secondary | ICD-10-CM | POA: Insufficient documentation

## 2012-06-10 MED ORDER — METOCLOPRAMIDE HCL 10 MG PO TABS: 10.0000 mg | ORAL_TABLET | Freq: Four times a day (QID) | ORAL | Status: DC

## 2012-06-10 MED ORDER — CHLORPROMAZINE HCL 10 MG PO TABS: 25.0000 mg | ORAL_TABLET | Freq: Three times a day (TID) | ORAL | Status: DC

## 2012-06-10 MED ORDER — CHLORPROMAZINE HCL 25 MG PO TABS
50.0000 mg | ORAL_TABLET | Freq: Once | ORAL | Status: AC
  Administered 2012-06-10: 50 mg via ORAL
  Filled 2012-06-10: qty 2

## 2012-06-10 MED ORDER — METOCLOPRAMIDE HCL 10 MG PO TABS
10.0000 mg | ORAL_TABLET | Freq: Four times a day (QID) | ORAL | Status: DC | PRN
  Administered 2012-06-10: 10 mg via ORAL
  Filled 2012-06-10: qty 1

## 2012-06-10 NOTE — ED Notes (Signed)
Bed:WHALA<BR> Expected date:06/10/12<BR> Expected time:12:28 AM<BR> Means of arrival:Ambulance<BR> Comments:<BR> S/p knee replacement, hiccups for a week

## 2012-06-10 NOTE — ED Notes (Signed)
Pt to ED via EMS; pt reports had total left knee replacement 1 week ago; pt has had continual hiccups since surgery; MD and anesthesia from surgery aware; pt states "I need some relief from these"; pt reports hiccups are every 2-3 sec consistently and has increase in spasm (hiccup) at times.

## 2012-06-10 NOTE — ED Notes (Signed)
Pt c/o "spasm" that takes his breath away; Pt sitting up on stretcher; color WNL; episode last approx 45 sec and resolved; pt states that he cannot breathe during episode but able to breathe normally  once episode resolved. Family at bedside and concerned that pt is going to have an episode at home and die due to the fact that he says he cannot breath. Advised that MD will speak to family and pt once available. Continuous SPO2 placed on pt.

## 2012-06-11 NOTE — ED Provider Notes (Signed)
History     CSN: [id number]  Arrival date & time 06/10/12  0055   First MD Initiated Contact with Marvin Garcia 06/10/12 0340      Chief Complaint  Marvin Garcia presents with  . Hiccups    (Consider location/radiation/quality/duration/timing/severity/associated sxs/prior treatment) The history is provided by the Marvin Garcia and the spouse. No language interpreter was used.  cc:56yo male post L knee surgery Friday with intractable hiccups x 4 days.  Unable to sleep.  Past Medical History  Diagnosis Date  . Hypertension   . Arthritis     Past Surgical History  Procedure Date  . Hernia repair   . Knee arthroscopy   . Total knee arthroplasty 06/04/2012    Procedure: TOTAL KNEE ARTHROPLASTY;  Surgeon: Sydnee Cabal, MD;  Location: [facility] ORS;  Service: Orthopedics;  Laterality: Left;    No family history on file.  History  Substance Use Topics  . Smoking status: Never Smoker   . Smokeless tobacco: Never Used  . Alcohol Use: No      Review of Systems  Constitutional: Negative.   HENT: Negative.        Hiccups  Eyes: Negative.   Respiratory: Negative.   Cardiovascular: Negative.   Gastrointestinal: Negative.   Musculoskeletal:       L knee pain  Neurological: Negative.   Psychiatric/Behavioral: Negative.   All other systems reviewed and are negative.    Allergies  Review of Marvin Garcia's allergies indicates no known allergies.  Home Medications   Current Outpatient Rx  Name Route Sig Dispense Refill  . AMLODIPINE BESYLATE 10 MG PO TABS Oral Take 10 mg by mouth every morning.     . ASPIRIN EC 325 MG PO TBEC Oral Take 1 tablet (325 mg total) by mouth 2 (two) times daily. 60 tablet 0  . ATENOLOL 100 MG PO TABS Oral Take 100 mg by mouth daily. EVERY AM    . ATORVASTATIN CALCIUM 20 MG PO TABS Oral Take 20 mg by mouth every evening.     Marland Kitchen HYDROCHLOROTHIAZIDE 25 MG PO TABS Oral Take 25 mg by mouth every morning.     Marland Kitchen LISINOPRIL 40 MG PO TABS Oral Take 40 mg by mouth every  morning.     Marland Kitchen METHOCARBAMOL 500 MG PO TABS Oral Take 1 tablet (500 mg total) by mouth every 6 (six) hours as needed. 40 tablet 3  . ADULT MULTIVITAMIN W/MINERALS CH Oral Take 1 tablet by mouth daily.    . OMEGA 3 PO Oral Take 500 mg by mouth daily.    . OXYCODONE HCL 5 MG PO TABS Oral Take 1-2 tablets (5-10 mg total) by mouth every 3 (three) hours as needed. 60 tablet 0  . VITAMIN C 500 MG PO TABS Oral Take 500 mg by mouth daily.    . CHLORPROMAZINE HCL 10 MG PO TABS Oral Take 2.5 tablets (25 mg total) by mouth 3 (three) times daily. 12 tablet 0  . METOCLOPRAMIDE HCL 10 MG PO TABS Oral Take 1 tablet (10 mg total) by mouth every 6 (six) hours. 30 tablet 0    BP 102/55  Pulse 72  Temp 99.4 F (37.4 C) (Oral)  Resp 18  Ht 5\' 7"  (1.702 m)  Wt 244 lb (110.678 kg)  BMI 38.22 kg/m2  SpO2 98%  Physical Exam  Nursing note and vitals reviewed. Constitutional: He is oriented to person, place, and time. He appears well-developed and well-nourished.  HENT:  Head: Normocephalic.  Eyes: Conjunctivae normal and EOM are  normal. Pupils are equal, round, and reactive to light.  Neck: Normal range of motion. Neck supple.  Cardiovascular: Normal rate.   Pulmonary/Chest: Effort normal and breath sounds normal. No respiratory distress.  Abdominal: Soft.  Musculoskeletal: Normal range of motion. He exhibits tenderness.       L knee tenderness no calf pain no sob  Neurological: He is alert and oriented to person, place, and time.  Skin: Skin is warm and dry.  Psychiatric: He has a normal mood and affect.    ED Course  Procedures (including critical care time)  Labs Reviewed - No data to display No results found.   1. Hiccups       MDM  Hiccups x 4 days.  Reglan and thorazine in the ER with Rx for the same.  Will follow up with pcp tomorrow if not better.          [name], NP 06/11/12 1949

## 2012-06-12 NOTE — ED Provider Notes (Signed)
Medical screening examination/treatment/procedure(s) were performed by non-physician practitioner and as supervising physician I was immediately available for consultation/collaboration.  Teressa Lower, MD 06/12/12 [phone]

## 2012-11-06 ENCOUNTER — Ambulatory Visit (INDEPENDENT_AMBULATORY_CARE_PROVIDER_SITE_OTHER): Payer: 59 | Admitting: Emergency Medicine

## 2012-11-06 ENCOUNTER — Ambulatory Visit: Payer: 59

## 2012-11-06 VITALS — BP 167/95 | HR 71 | Temp 98.2°F | Resp 18 | Ht 66.38 in | Wt 246.4 lb

## 2012-11-06 DIAGNOSIS — R05 Cough: Secondary | ICD-10-CM

## 2012-11-06 DIAGNOSIS — I10 Essential (primary) hypertension: Secondary | ICD-10-CM

## 2012-11-06 DIAGNOSIS — J209 Acute bronchitis, unspecified: Secondary | ICD-10-CM

## 2012-11-06 DIAGNOSIS — R059 Cough, unspecified: Secondary | ICD-10-CM

## 2012-11-06 MED ORDER — AZITHROMYCIN 250 MG PO TABS: ORAL_TABLET | ORAL | Status: DC

## 2012-11-06 MED ORDER — BENZONATATE 100 MG PO CAPS: 100.0000 mg | ORAL_CAPSULE | Freq: Three times a day (TID) | ORAL | Status: DC | PRN

## 2012-11-06 NOTE — Patient Instructions (Signed)
Bronchitis Bronchitis is the body's way of reacting to injury and/or infection (inflammation) of the bronchi. Bronchi are the air tubes that extend from the windpipe into the lungs. If the inflammation becomes severe, it may cause shortness of breath. CAUSES  Inflammation may be caused by:  A virus.  Germs (bacteria).  Dust.  Allergens.  Pollutants and many other irritants. The cells lining the bronchial tree are covered with tiny hairs (cilia). These constantly beat upward, away from the lungs, toward the mouth. This keeps the lungs free of pollutants. When these cells become too irritated and are unable to do their job, mucus begins to develop. This causes the characteristic cough of bronchitis. The cough clears the lungs when the cilia are unable to do their job. Without either of these protective mechanisms, the mucus would settle in the lungs. Then you would develop pneumonia. Smoking is a common cause of bronchitis and can contribute to pneumonia. Stopping this habit is the single most important thing you can do to help yourself. TREATMENT   Your caregiver may prescribe an antibiotic if the cough is caused by bacteria. Also, medicines that open up your airways make it easier to breathe. Your caregiver may also recommend or prescribe an expectorant. It will loosen the mucus to be coughed up. Only take over-the-counter or prescription medicines for pain, discomfort, or fever as directed by your caregiver.  Removing whatever causes the problem (smoking, for example) is critical to preventing the problem from getting worse.  Cough suppressants may be prescribed for relief of cough symptoms.  Inhaled medicines may be prescribed to help with symptoms now and to help prevent problems from returning.  For those with recurrent (chronic) bronchitis, there may be a need for steroid medicines. SEEK IMMEDIATE MEDICAL CARE IF:   During treatment, you develop more pus-like mucus (purulent  sputum).  You have a fever.  Your baby is older than 3 months with a rectal temperature of 102 F (38.9 C) or higher.  Your baby is 46 months old or younger with a rectal temperature of 100.4 F (38 C) or higher.  You become progressively more ill.  You have increased difficulty breathing, wheezing, or shortness of breath. It is necessary to seek immediate medical care if you are elderly or sick from any other disease. MAKE SURE YOU:   Understand these instructions.  Will watch your condition.  Will get help right away if you are not doing well or get worse. Document Released: 07/28/2005 Document Revised: 10/20/2011 Document Reviewed: 06/06/2008 [facility] Patient Information 2013 [place]. Cough, Adult  A cough is a reflex that helps clear your throat and airways. It can help heal the body or may be a reaction to an irritated airway. A cough may only last 2 or 3 weeks (acute) or may last more than 8 weeks (chronic).  CAUSES Acute cough:  Viral or bacterial infections. Chronic cough:  Infections.  Allergies.  Asthma.  Post-nasal drip.  Smoking.  Heartburn or acid reflux.  Some medicines.  Chronic lung problems (COPD).  Cancer. SYMPTOMS   Cough.  Fever.  Chest pain.  Increased breathing rate.  High-pitched whistling sound when breathing (wheezing).  Colored mucus that you cough up (sputum). TREATMENT   A bacterial cough may be treated with antibiotic medicine.  A viral cough must run its course and will not respond to antibiotics.  Your caregiver may recommend other treatments if you have a chronic cough. HOME CARE INSTRUCTIONS   Only take over-the-counter or  prescription medicines for pain, discomfort, or fever as directed by your caregiver. Use cough suppressants only as directed by your caregiver.  Use a cold steam vaporizer or humidifier in your bedroom or home to help loosen secretions.  Sleep in a semi-upright position if your cough  is worse at night.  Rest as needed.  Stop smoking if you smoke. SEEK IMMEDIATE MEDICAL CARE IF:   You have pus in your sputum.  Your cough starts to worsen.  You cannot control your cough with suppressants and are losing sleep.  You begin coughing up blood.  You have difficulty breathing.  You develop pain which is getting worse or is uncontrolled with medicine.  You have a fever. MAKE SURE YOU:   Understand these instructions.  Will watch your condition.  Will get help right away if you are not doing well or get worse. Document Released: 01/24/2011 Document Revised: 10/20/2011 Document Reviewed: 01/24/2011 [facility] Patient Information 2013 [place].

## 2012-11-06 NOTE — Progress Notes (Signed)
  Subjective:    Patient ID: Marvin Garcia, male    DOB: [birth year], 56 y.o.   MRN: [id number]  HPI 57 year old male presents with:  Has been dealing with a cough, productive yellow phlegm since last Friday. When he coughs, throat is scratchy feeling. Notices some chills and fever, but has not measured with thermometer. Positive nasal congestion. Has no history of asthma or allergies. Has been taking OTC cough drops and Mucinex DM. He has been using Lisinopril 40mg , daily. Has HTN, but no heart disease. He was seen at ER in October for hiccups x 7 days, but it was due to the pain medication (hydrocodone).  Review of Systems     Objective:   Physical Exam patient is alert and cooperative but in no distress. His neck is supple. His chest exam reveals a few rhonchi on the right. His cardiac exam reveals a regular rate and rhythm without murmur.  [facility] reading (PRIMARY) by  Dr. Everlene Farrier chest x-ray shows minimal increased markings in the right base. There is a scoliotic curve of the T-spine with significant arthritic change. There is no consolidated infiltrate seen. .       Assessment & Plan:  He has been on lisinopril for years making this to be less likely the source of this cough. We'll go ahead and check a chest x-ray. He has been sick only a week and has had some yellow phlegm. If his chest x-ray is normal we'll treat this as a bronchitis rather than stopping his lisinopril he has been on for years. We'll treat with a Z-Pak and Tessalon Perles since apparently he developed hiccups from hydrocodone

## 2013-07-13 ENCOUNTER — Other Ambulatory Visit: Payer: Self-pay | Admitting: Physician Assistant

## 2013-07-13 ENCOUNTER — Ambulatory Visit
Admission: RE | Admit: 2013-07-13 | Discharge: 2013-07-13 | Disposition: A | Discharge status: Home / Self Care | Payer: 59 | Source: Ambulatory Visit | Attending: Physician Assistant | Admitting: Physician Assistant

## 2013-07-13 DIAGNOSIS — R52 Pain, unspecified: Secondary | ICD-10-CM

## 2015-03-19 ENCOUNTER — Other Ambulatory Visit: Payer: Self-pay | Admitting: Nephrology

## 2015-03-19 DIAGNOSIS — N183 Chronic kidney disease, stage 3 unspecified: Secondary | ICD-10-CM

## 2015-03-19 DIAGNOSIS — E1022 Type 1 diabetes mellitus with diabetic chronic kidney disease: Secondary | ICD-10-CM

## 2015-03-21 ENCOUNTER — Ambulatory Visit
Admission: RE | Admit: 2015-03-21 | Discharge: 2015-03-21 | Disposition: A | Discharge status: Home / Self Care | Payer: Commercial Managed Care - HMO | Source: Ambulatory Visit | Attending: Nephrology | Admitting: Nephrology

## 2015-03-21 DIAGNOSIS — E1022 Type 1 diabetes mellitus with diabetic chronic kidney disease: Secondary | ICD-10-CM

## 2015-03-21 DIAGNOSIS — N183 Chronic kidney disease, stage 3 unspecified: Secondary | ICD-10-CM

## 2016-09-05 DIAGNOSIS — R51 Headache: Secondary | ICD-10-CM | POA: Diagnosis not present

## 2016-09-05 DIAGNOSIS — J069 Acute upper respiratory infection, unspecified: Secondary | ICD-10-CM | POA: Diagnosis not present

## 2016-10-01 ENCOUNTER — Other Ambulatory Visit: Payer: Self-pay | Admitting: Family Medicine

## 2016-10-01 DIAGNOSIS — R51 Headache: Principal | ICD-10-CM

## 2016-10-01 DIAGNOSIS — R519 Headache, unspecified: Secondary | ICD-10-CM

## 2016-10-03 ENCOUNTER — Ambulatory Visit
Admission: RE | Admit: 2016-10-03 | Discharge: 2016-10-03 | Disposition: A | Discharge status: Home / Self Care | Payer: Commercial Managed Care - HMO | Source: Ambulatory Visit | Attending: Family Medicine | Admitting: Family Medicine

## 2016-10-03 ENCOUNTER — Other Ambulatory Visit: Payer: Self-pay | Admitting: Family Medicine

## 2016-10-03 DIAGNOSIS — R51 Headache: Principal | ICD-10-CM

## 2016-10-03 DIAGNOSIS — Z01818 Encounter for other preprocedural examination: Secondary | ICD-10-CM | POA: Diagnosis not present

## 2016-10-03 DIAGNOSIS — R519 Headache, unspecified: Secondary | ICD-10-CM

## 2016-10-08 ENCOUNTER — Ambulatory Visit
Admission: RE | Admit: 2016-10-08 | Discharge: 2016-10-08 | Disposition: A | Discharge status: Home / Self Care | Payer: Commercial Managed Care - HMO | Source: Ambulatory Visit | Attending: Family Medicine | Admitting: Family Medicine

## 2016-10-08 DIAGNOSIS — R519 Headache, unspecified: Secondary | ICD-10-CM

## 2016-10-08 DIAGNOSIS — R51 Headache: Principal | ICD-10-CM

## 2016-10-22 ENCOUNTER — Other Ambulatory Visit: Payer: Self-pay

## 2016-10-29 ENCOUNTER — Ambulatory Visit
Admission: RE | Admit: 2016-10-29 | Discharge: 2016-10-29 | Disposition: A | Discharge status: Home / Self Care | Payer: Commercial Managed Care - HMO | Source: Ambulatory Visit | Attending: Family Medicine | Admitting: Family Medicine

## 2016-10-29 DIAGNOSIS — R51 Headache: Secondary | ICD-10-CM | POA: Diagnosis not present

## 2016-10-30 DIAGNOSIS — N2581 Secondary hyperparathyroidism of renal origin: Secondary | ICD-10-CM | POA: Diagnosis not present

## 2016-10-30 DIAGNOSIS — I129 Hypertensive chronic kidney disease with stage 1 through stage 4 chronic kidney disease, or unspecified chronic kidney disease: Secondary | ICD-10-CM | POA: Diagnosis not present

## 2016-10-30 DIAGNOSIS — N184 Chronic kidney disease, stage 4 (severe): Secondary | ICD-10-CM | POA: Diagnosis not present

## 2016-10-30 DIAGNOSIS — R809 Proteinuria, unspecified: Secondary | ICD-10-CM | POA: Diagnosis not present

## 2016-11-12 DIAGNOSIS — H40033 Anatomical narrow angle, bilateral: Secondary | ICD-10-CM | POA: Diagnosis not present

## 2016-11-12 DIAGNOSIS — H2513 Age-related nuclear cataract, bilateral: Secondary | ICD-10-CM | POA: Diagnosis not present

## 2016-11-21 DIAGNOSIS — M25552 Pain in left hip: Secondary | ICD-10-CM | POA: Diagnosis not present

## 2016-11-21 DIAGNOSIS — K429 Umbilical hernia without obstruction or gangrene: Secondary | ICD-10-CM | POA: Diagnosis not present

## 2016-12-09 ENCOUNTER — Encounter: Payer: Self-pay | Admitting: Surgery

## 2016-12-09 DIAGNOSIS — K429 Umbilical hernia without obstruction or gangrene: Secondary | ICD-10-CM | POA: Diagnosis not present

## 2016-12-17 ENCOUNTER — Ambulatory Visit: Payer: Self-pay | Admitting: Surgery

## 2016-12-17 NOTE — H&P (Signed)
Marvin Garcia 12/09/2016 2:56 PM Location: [facility] Patient #: [id number] DOB: 1955/09/25 Married / Language: English / Race: Black or African American Male   History of Present Illness (Corrinne Benegas A. Kae Heller MD; 12/09/2016 3:55 PM) Patient words: This is a very nice 61 year old gentleman who presents with umbilical hernia. It has been present for several years and does not really bother him however a few weeks ago he developed a hard nodule at the superior aspect of it which was somewhat tender. He states that the nodule has gone away and is no longer hurting him. He has not had any issues with nausea, vomiting, abdominal distention or constipation. He has had a left inguinal hernia repaired many years ago but no prior abdominal surgeries. He works for [employer] and has done so since 1993.  The patient is a 61 year old male.   Past Surgical History Malachy Moan, [place]; 12/09/2016 2:56 PM) Knee Surgery  Left.  Diagnostic Studies History Malachy Moan, [place]; 12/09/2016 2:56 PM) Colonoscopy  5-10 years ago  Allergies Malachy Moan, RMA; 12/09/2016 2:58 PM) OxyCODONE HCl *ANALGESICS - OPIOID*  hiccups  Medication History Malachy Moan, RMA; 12/09/2016 3:01 PM) Uloric (40MG  Tablet, Oral) Active. Atenolol (100MG  Tablet, Oral) Active. Atorvastatin Calcium (20MG  Tablet, Oral) Active. Calcitriol (0.25MCG Capsule, Oral) Active. CloNIDine HCl (0.1MG  Tablet, Oral) Active. Furosemide (40MG  Tablet, Oral) Active. HydrALAZINE HCl (100MG  Tablet, Oral) Active. Imipramine HCl (25MG  Tablet, Oral) Active. Irbesartan (300MG  Tablet, Oral) Active. Vitamin C (1000MG  Tablet, Oral) Active. Multi For Him (Oral) Active. CoQ-10 (100MG  Capsule, Oral) Active. Diclofenac Sodium (50MG  Tablet, Oral) Active. Medications Reconciled  Social History Malachy Moan, [place]; 12/09/2016 2:56 PM) Alcohol use  Remotely quit alcohol use. Caffeine use  Coffee,  Tea. No drug use  Tobacco use  Never smoker.  Family History Malachy Moan, [place]; 12/09/2016 2:56 PM) Arthritis  Sister. Diabetes Mellitus  Mother. Heart Disease  Father. Hypertension  Father.  Other Problems Malachy Moan, RMA; 12/09/2016 2:56 PM) Arthritis  High blood pressure  Hypercholesterolemia     Review of Systems Malachy Moan RMA; 12/09/2016 2:56 PM) General Not Present- Appetite Loss, Chills, Fatigue, Fever, Night Sweats, Weight Gain and Weight Loss. Skin Not Present- Change in Wart/Mole, Dryness, Hives, Jaundice, New Lesions, Non-Healing Wounds, Rash and Ulcer. HEENT Present- Wears glasses/contact lenses. Not Present- Earache, Hearing Loss, Hoarseness, Nose Bleed, Oral Ulcers, Ringing in the Ears, Seasonal Allergies, Sinus Pain, Sore Throat, Visual Disturbances and Yellow Eyes. Respiratory Not Present- Bloody sputum, Chronic Cough, Difficulty Breathing, Snoring and Wheezing. Breast Not Present- Breast Mass, Breast Pain, Nipple Discharge and Skin Changes. Cardiovascular Not Present- Chest Pain, Difficulty Breathing Lying Down, Leg Cramps, Palpitations, Rapid Heart Rate, Shortness of Breath and Swelling of Extremities. Gastrointestinal Not Present- Abdominal Pain, Bloating, Bloody Stool, Change in Bowel Habits, Chronic diarrhea, Constipation, Difficulty Swallowing, Excessive gas, Gets full quickly at meals, Hemorrhoids, Indigestion, Nausea, Rectal Pain and Vomiting. Male Genitourinary Not Present- Blood in Urine, Change in Urinary Stream, Frequency, Impotence, Nocturia, Painful Urination, Urgency and Urine Leakage. Musculoskeletal Present- Joint Pain. Not Present- Back Pain, Joint Stiffness, Muscle Pain, Muscle Weakness and Swelling of Extremities. Psychiatric Not Present- Anxiety, Bipolar, Change in Sleep Pattern, Depression, Fearful and Frequent crying.  Vitals Malachy Moan RMA; 12/09/2016 3:02 PM) 12/09/2016 3:01 PM Weight: 249.8 lb Height: 67in Body  Surface Area: 2.22 m Body Mass Index: 39.12 kg/m  Temp.: 99.50F  Pulse: 76 (Regular)  BP: 150/100 (Sitting, Left Arm, Standard)       Physical Exam (  Kileigh Ortmann A. Kae Heller MD; 12/09/2016 3:57 PM) General Note: Alert and well-appearing   Integumentary Note: No lesions or rashes limited skin exam   Eye Note: Anicteric, extraocular motion intact   Chest and Lung Exam Note: Unlabored respirations, symmetrical air entry   Cardiovascular Note: Regular rate and rhythm, no pedal edema   Abdomen Note: Obese, nontender, nondistended. There is a reducible umbilical hernia. Fascial defect is approximately 1/2 cm in diameter. He has a wide midline diastases superiorly. No palpable mass or organomegaly   Neurologic Note: Grossly intact, antalgic gait due to l hip pain   Neuropsychiatric Note: Normal mood and affect, appropriate insight   Musculoskeletal Note: Strength symmetrical throughout, no deformity     Assessment & Plan (Harlie Buening A. Kae Heller MD; 03/12/6414 8:30 PM) UMBILICAL HERNIA (N40.7) Story: Minimally symptomatic. He likely had an episode of incarcerated fat which is reduced itself. We discussed hernia repair both laparoscopic and open with use of mesh. Discussed risks of bleeding, infection, pain, scarring, injury to injure abdominal structures, hernia recurrence as well as the risks of general anesthesia. We also discussed the option of nonoperative management including the risks of incarceration, strangulation, increasing size and pain from the hernia, as well as signs and symptoms to monitor for that should prompt him to go to the ER for emergent evaluation and possible surgery. A specimen or staining or conversation and his questions were answered. I recommended proceeding with hernia repair suggesting that an open approach would be appropriate for him. He will consider surgery and will call us if and when he is ready to schedule.

## 2017-01-12 NOTE — Patient Instructions (Addendum)
Marvin Garcia  01/12/2017   Your procedure is scheduled on: 01-21-17  Report to [facility] Main  Entrance Take [place]  elevators to 3rd floor to  [place] at [phone].  Call this number if you have problems the morning of surgery [phone]   Remember: ONLY 1 PERSON MAY GO WITH YOU TO SHORT STAY TO GET  READY MORNING OF [place].  Do not eat food or drink liquids :After Midnight.     Take these medicines the morning of surgery with A SIP OF WATER: AMLODIPINE(NORVASC), ATENOLOL , calcitriol, clonidine(catapres), febuxostat(uloric), hydralazine(catapres)                                You may not have any metal on your body including hair pins and              piercings  Do not wear jewelry, make-up, lotions, powders or perfumes, deodorant               Men may shave face and neck.   Do not bring valuables to the hospital. [place].  Contacts, dentures or bridgework may not be worn into surgery.      Patients discharged the day of surgery will not be allowed to drive home.  Name and phone number of your driver:  Special Instructions: N/A              Please read over the following fact sheets you were given: _____________________________________________________________________             [facility] - Preparing for Surgery Before surgery, you can play an important role.  Because skin is not sterile, your skin needs to be as free of germs as possible.  You can reduce the number of germs on your skin by washing with CHG (chlorahexidine gluconate) soap before surgery.  CHG is an antiseptic cleaner which kills germs and bonds with the skin to continue killing germs even after washing. Please DO NOT use if you have an allergy to CHG or antibacterial soaps.  If your skin becomes reddened/irritated stop using the CHG and inform your nurse when you arrive at Short Stay. Do not shave (including legs  and underarms) for at least 48 hours prior to the first CHG shower.  You may shave your face/neck. Please follow these instructions carefully:  1.  Shower with CHG Soap the night before surgery and the  morning of Surgery.  2.  If you choose to wash your hair, wash your hair first as usual with your  normal  shampoo.  3.  After you shampoo, rinse your hair and body thoroughly to remove the  shampoo.                           4.  Use CHG as you would any other liquid soap.  You can apply chg directly  to the skin and wash                       Gently with a scrungie or clean washcloth.  5.  Apply the CHG Soap to your body ONLY FROM THE NECK DOWN.   Do  not use on face/ open                           Wound or open sores. Avoid contact with eyes, ears mouth and genitals (private parts).                       Wash face,  Genitals (private parts) with your normal soap.             6.  Wash thoroughly, paying special attention to the area where your surgery  will be performed.  7.  Thoroughly rinse your body with warm water from the neck down.  8.  DO NOT shower/wash with your normal soap after using and rinsing off  the CHG Soap.                9.  Pat yourself dry with a clean towel.            10.  Wear clean pajamas.            11.  Place clean sheets on your bed the night of your first shower and do not  sleep with pets. Day of Surgery : Do not apply any lotions/deodorants the morning of surgery.  Please wear clean clothes to the hospital/surgery center.  FAILURE TO FOLLOW THESE INSTRUCTIONS MAY RESULT IN THE CANCELLATION OF YOUR SURGERY PATIENT SIGNATURE_________________________________  NURSE SIGNATURE__________________________________  ________________________________________________________________________

## 2017-01-14 ENCOUNTER — Encounter (HOSPITAL_COMMUNITY): Payer: Self-pay

## 2017-01-14 ENCOUNTER — Encounter (HOSPITAL_COMMUNITY)
Admission: RE | Admit: 2017-01-14 | Discharge: 2017-01-14 | Disposition: A | Discharge status: Home / Self Care | Payer: Commercial Managed Care - HMO | Source: Ambulatory Visit | Attending: Surgery | Admitting: Surgery

## 2017-01-14 DIAGNOSIS — K429 Umbilical hernia without obstruction or gangrene: Secondary | ICD-10-CM | POA: Insufficient documentation

## 2017-01-14 DIAGNOSIS — Z01812 Encounter for preprocedural laboratory examination: Secondary | ICD-10-CM | POA: Diagnosis not present

## 2017-01-14 DIAGNOSIS — R001 Bradycardia, unspecified: Secondary | ICD-10-CM | POA: Insufficient documentation

## 2017-01-14 DIAGNOSIS — Z01818 Encounter for other preprocedural examination: Secondary | ICD-10-CM | POA: Insufficient documentation

## 2017-01-14 HISTORY — DX: Cardiac arrhythmia, unspecified: I49.9

## 2017-01-14 HISTORY — DX: Headache, unspecified: R51.9

## 2017-01-14 HISTORY — DX: Headache: R51

## 2017-01-14 HISTORY — DX: Umbilical hernia without obstruction or gangrene: K42.9

## 2017-01-14 LAB — CBC WITH DIFFERENTIAL/PLATELET
BASOS ABS: 0 10*3/uL (ref 0.0–0.1)
BASOS PCT: 0 %
EOS ABS: 0.1 10*3/uL (ref 0.0–0.7)
EOS PCT: 1 %
HCT: 35.8 % — ABNORMAL LOW (ref 39.0–52.0)
Hemoglobin: 12.6 g/dL — ABNORMAL LOW (ref 13.0–17.0)
LYMPHS PCT: 25 %
Lymphs Abs: 2.5 10*3/uL (ref 0.7–4.0)
MCH: 25.6 pg — ABNORMAL LOW (ref 26.0–34.0)
MCHC: 35.2 g/dL (ref 30.0–36.0)
MCV: 72.8 fL — AB (ref 78.0–100.0)
MONO ABS: 0.7 10*3/uL (ref 0.1–1.0)
Monocytes Relative: 7 %
Neutro Abs: 6.6 10*3/uL (ref 1.7–7.7)
Neutrophils Relative %: 67 %
PLATELETS: 334 10*3/uL (ref 150–400)
RBC: 4.92 MIL/uL (ref 4.22–5.81)
RDW: 16.8 % — AB (ref 11.5–15.5)
WBC: 10 10*3/uL (ref 4.0–10.5)

## 2017-01-14 LAB — COMPREHENSIVE METABOLIC PANEL
ALT: 23 U/L (ref 17–63)
AST: 22 U/L (ref 15–41)
Albumin: 4.1 g/dL (ref 3.5–5.0)
Alkaline Phosphatase: 68 U/L (ref 38–126)
Anion gap: 10 (ref 5–15)
BUN: 46 mg/dL — AB (ref 6–20)
CHLORIDE: 108 mmol/L (ref 101–111)
CO2: 24 mmol/L (ref 22–32)
Calcium: 9.5 mg/dL (ref 8.9–10.3)
Creatinine, Ser: 3.14 mg/dL — ABNORMAL HIGH (ref 0.61–1.24)
GFR calc Af Amer: 23 mL/min — ABNORMAL LOW (ref >60)
GFR, EST NON AFRICAN AMERICAN: 20 mL/min — AB (ref >60)
Glucose, Bld: 107 mg/dL — ABNORMAL HIGH (ref 65–99)
POTASSIUM: 3.3 mmol/L — AB (ref 3.5–5.1)
SODIUM: 142 mmol/L (ref 135–145)
Total Bilirubin: 0.8 mg/dL (ref 0.3–1.2)
Total Protein: 7.6 g/dL (ref 6.5–8.1)

## 2017-01-14 NOTE — Progress Notes (Signed)
CBCdiff and CMP results routed via epic to Dr Kae Heller

## 2017-01-21 ENCOUNTER — Encounter (HOSPITAL_COMMUNITY)
Admission: RE | Disposition: A | Discharge status: Home / Self Care | Payer: Self-pay | Source: Ambulatory Visit | Attending: Surgery

## 2017-01-21 ENCOUNTER — Ambulatory Visit (HOSPITAL_COMMUNITY): Payer: Commercial Managed Care - HMO | Admitting: Anesthesiology

## 2017-01-21 ENCOUNTER — Ambulatory Visit (HOSPITAL_COMMUNITY)
Admission: RE | Admit: 2017-01-21 | Discharge: 2017-01-21 | Disposition: A | Discharge status: Home / Self Care | Payer: Commercial Managed Care - HMO | Source: Ambulatory Visit | Attending: Surgery | Admitting: Surgery

## 2017-01-21 ENCOUNTER — Encounter (HOSPITAL_COMMUNITY): Payer: Self-pay | Admitting: *Deleted

## 2017-01-21 DIAGNOSIS — E7800 Pure hypercholesterolemia, unspecified: Secondary | ICD-10-CM | POA: Diagnosis not present

## 2017-01-21 DIAGNOSIS — K420 Umbilical hernia with obstruction, without gangrene: Secondary | ICD-10-CM | POA: Insufficient documentation

## 2017-01-21 DIAGNOSIS — I10 Essential (primary) hypertension: Secondary | ICD-10-CM | POA: Diagnosis not present

## 2017-01-21 DIAGNOSIS — Z6839 Body mass index (BMI) 39.0-39.9, adult: Secondary | ICD-10-CM | POA: Diagnosis not present

## 2017-01-21 DIAGNOSIS — Z885 Allergy status to narcotic agent status: Secondary | ICD-10-CM | POA: Diagnosis not present

## 2017-01-21 DIAGNOSIS — M1990 Unspecified osteoarthritis, unspecified site: Secondary | ICD-10-CM | POA: Insufficient documentation

## 2017-01-21 DIAGNOSIS — Z79899 Other long term (current) drug therapy: Secondary | ICD-10-CM | POA: Insufficient documentation

## 2017-01-21 DIAGNOSIS — K429 Umbilical hernia without obstruction or gangrene: Secondary | ICD-10-CM | POA: Diagnosis not present

## 2017-01-21 HISTORY — PX: INSERTION OF MESH: SHX5868

## 2017-01-21 HISTORY — PX: UMBILICAL HERNIA REPAIR: SHX196

## 2017-01-21 SURGERY — REPAIR, HERNIA, UMBILICAL, ADULT
Anesthesia: General | Site: Abdomen

## 2017-01-21 MED ORDER — GABAPENTIN 300 MG PO CAPS
300.0000 mg | ORAL_CAPSULE | ORAL | Status: AC
  Administered 2017-01-21: 300 mg via ORAL
  Filled 2017-01-21: qty 1

## 2017-01-21 MED ORDER — CEFAZOLIN SODIUM-DEXTROSE 2-4 GM/100ML-% IV SOLN
2.0000 g | INTRAVENOUS | Status: AC
  Administered 2017-01-21: 2 g via INTRAVENOUS
  Filled 2017-01-21: qty 100

## 2017-01-21 MED ORDER — 0.9 % SODIUM CHLORIDE (POUR BTL) OPTIME
TOPICAL | Status: DC | PRN
  Administered 2017-01-21: 1000 mL

## 2017-01-21 MED ORDER — LACTATED RINGERS IV SOLN
INTRAVENOUS | Status: DC
  Administered 2017-01-21 (×2): via INTRAVENOUS

## 2017-01-21 MED ORDER — ROCURONIUM BROMIDE 50 MG/5ML IV SOSY
PREFILLED_SYRINGE | INTRAVENOUS | Status: AC
  Filled 2017-01-21: qty 5

## 2017-01-21 MED ORDER — EPHEDRINE 5 MG/ML INJ
INTRAVENOUS | Status: AC
  Filled 2017-01-21: qty 10

## 2017-01-21 MED ORDER — BUPIVACAINE-EPINEPHRINE 0.25% -1:200000 IJ SOLN
INTRAMUSCULAR | Status: DC | PRN
  Administered 2017-01-21: 30 mL

## 2017-01-21 MED ORDER — MEPERIDINE HCL 50 MG/ML IJ SOLN: 6.2500 mg | INTRAMUSCULAR | Status: DC | PRN

## 2017-01-21 MED ORDER — GLYCOPYRROLATE 0.2 MG/ML IV SOSY
PREFILLED_SYRINGE | INTRAVENOUS | Status: DC | PRN
  Administered 2017-01-21: .2 mg via INTRAVENOUS

## 2017-01-21 MED ORDER — FENTANYL CITRATE (PF) 100 MCG/2ML IJ SOLN
INTRAMUSCULAR | Status: AC
  Filled 2017-01-21: qty 2

## 2017-01-21 MED ORDER — PROPOFOL 10 MG/ML IV BOLUS
INTRAVENOUS | Status: AC
Start: 2017-01-21 — End: 2017-01-21
  Filled 2017-01-21: qty 20

## 2017-01-21 MED ORDER — PROMETHAZINE HCL 25 MG/ML IJ SOLN: 12.5000 mg | Freq: Once | INTRAMUSCULAR | Status: DC | PRN

## 2017-01-21 MED ORDER — FENTANYL CITRATE (PF) 100 MCG/2ML IJ SOLN
INTRAMUSCULAR | Status: DC | PRN
  Administered 2017-01-21: 100 ug via INTRAVENOUS

## 2017-01-21 MED ORDER — ACETAMINOPHEN 10 MG/ML IV SOLN: 1000.0000 mg | Freq: Once | INTRAVENOUS | Status: DC | PRN

## 2017-01-21 MED ORDER — SUGAMMADEX SODIUM 500 MG/5ML IV SOLN
INTRAVENOUS | Status: DC | PRN
  Administered 2017-01-21: 225.8 mg via INTRAVENOUS
  Administered 2017-01-21: 150 mg via INTRAVENOUS

## 2017-01-21 MED ORDER — DOCUSATE SODIUM 100 MG PO CAPS: 100.0000 mg | ORAL_CAPSULE | Freq: Two times a day (BID) | ORAL | 0 refills | Status: AC

## 2017-01-21 MED ORDER — OXYCODONE-ACETAMINOPHEN 5-325 MG PO TABS: 1.0000 | ORAL_TABLET | Freq: Four times a day (QID) | ORAL | 0 refills | Status: DC | PRN

## 2017-01-21 MED ORDER — CELECOXIB 200 MG PO CAPS
400.0000 mg | ORAL_CAPSULE | ORAL | Status: AC
  Administered 2017-01-21: 400 mg via ORAL
  Filled 2017-01-21: qty 2

## 2017-01-21 MED ORDER — ROCURONIUM BROMIDE 10 MG/ML (PF) SYRINGE
PREFILLED_SYRINGE | INTRAVENOUS | Status: DC | PRN
  Administered 2017-01-21: 10 mg via INTRAVENOUS
  Administered 2017-01-21: 40 mg via INTRAVENOUS

## 2017-01-21 MED ORDER — PROPOFOL 10 MG/ML IV BOLUS
INTRAVENOUS | Status: DC | PRN
  Administered 2017-01-21: 200 mg via INTRAVENOUS

## 2017-01-21 MED ORDER — EPHEDRINE SULFATE-NACL 50-0.9 MG/10ML-% IV SOSY
PREFILLED_SYRINGE | INTRAVENOUS | Status: DC | PRN
  Administered 2017-01-21: 5 mg via INTRAVENOUS
  Administered 2017-01-21: 10 mg via INTRAVENOUS
  Administered 2017-01-21: 5 mg via INTRAVENOUS
  Administered 2017-01-21: 10 mg via INTRAVENOUS
  Administered 2017-01-21: 5 mg via INTRAVENOUS
  Administered 2017-01-21: 10 mg via INTRAVENOUS

## 2017-01-21 MED ORDER — MIDAZOLAM HCL 2 MG/2ML IJ SOLN
INTRAMUSCULAR | Status: AC
  Filled 2017-01-21: qty 2

## 2017-01-21 MED ORDER — MIDAZOLAM HCL 5 MG/5ML IJ SOLN
INTRAMUSCULAR | Status: DC | PRN
  Administered 2017-01-21: 2 mg via INTRAVENOUS

## 2017-01-21 MED ORDER — ACETAMINOPHEN 160 MG/5ML PO SOLN: 325.0000 mg | ORAL | Status: DC | PRN

## 2017-01-21 MED ORDER — KETAMINE HCL 10 MG/ML IJ SOLN
INTRAMUSCULAR | Status: DC | PRN
  Administered 2017-01-21: 10 mg via INTRAVENOUS
  Administered 2017-01-21: 30 mg via INTRAVENOUS
  Administered 2017-01-21: 10 mg via INTRAVENOUS

## 2017-01-21 MED ORDER — ACETAMINOPHEN 500 MG PO TABS
1000.0000 mg | ORAL_TABLET | ORAL | Status: AC
  Administered 2017-01-21: 1000 mg via ORAL
  Filled 2017-01-21: qty 2

## 2017-01-21 MED ORDER — ACETAMINOPHEN 325 MG PO TABS: 325.0000 mg | ORAL_TABLET | ORAL | Status: DC | PRN

## 2017-01-21 MED ORDER — CHLORHEXIDINE GLUCONATE 4 % EX LIQD: 60.0000 mL | Freq: Once | CUTANEOUS | Status: DC

## 2017-01-21 MED ORDER — FENTANYL CITRATE (PF) 100 MCG/2ML IJ SOLN: 25.0000 ug | INTRAMUSCULAR | Status: DC | PRN

## 2017-01-21 MED ORDER — BUPIVACAINE-EPINEPHRINE (PF) 0.25% -1:200000 IJ SOLN
INTRAMUSCULAR | Status: AC
  Filled 2017-01-21: qty 30

## 2017-01-21 MED ORDER — LIDOCAINE 2% (20 MG/ML) 5 ML SYRINGE
INTRAMUSCULAR | Status: DC | PRN
  Administered 2017-01-21: 80 mg via INTRAVENOUS
  Administered 2017-01-21: 100 mg via INTRAVENOUS

## 2017-01-21 SURGICAL SUPPLY — 25 items
APL SKNCLS STERI-STRIP NONHPOA (GAUZE/BANDAGES/DRESSINGS) ×1
BENZOIN TINCTURE PRP APPL 2/3 (GAUZE/BANDAGES/DRESSINGS) ×2 IMPLANT
CHLORAPREP W/TINT 26ML (MISCELLANEOUS) ×2 IMPLANT
COVER SURGICAL LIGHT HANDLE (MISCELLANEOUS) ×2 IMPLANT
DECANTER SPIKE VIAL GLASS SM (MISCELLANEOUS) ×2 IMPLANT
DRAPE LAPAROSCOPIC ABDOMINAL (DRAPES) ×2 IMPLANT
ELECT REM PT RETURN 15FT ADLT (MISCELLANEOUS) ×2 IMPLANT
GAUZE SPONGE 4X4 12PLY STRL (GAUZE/BANDAGES/DRESSINGS) ×2 IMPLANT
GLOVE BIO SURGEON STRL SZ 6 (GLOVE) ×2 IMPLANT
GLOVE INDICATOR 6.5 STRL GRN (GLOVE) ×2 IMPLANT
GOWN STRL REUS W/TWL LRG LVL3 (GOWN DISPOSABLE) ×2 IMPLANT
GOWN STRL REUS W/TWL XL LVL3 (GOWN DISPOSABLE) ×2 IMPLANT
KIT BASIN OR (CUSTOM PROCEDURE TRAY) ×2 IMPLANT
MESH VENTRALEX ST 2.5 CRC MED (Mesh General) ×2 IMPLANT
NEEDLE HYPO 22GX1.5 SAFETY (NEEDLE) ×2 IMPLANT
PACK GENERAL/GYN (CUSTOM PROCEDURE TRAY) ×2 IMPLANT
STRIP CLOSURE SKIN 1/2X4 (GAUZE/BANDAGES/DRESSINGS) ×2 IMPLANT
SUT ETHIBOND 0 MO6 C/R (SUTURE) ×2 IMPLANT
SUT MNCRL AB 4-0 PS2 18 (SUTURE) ×2 IMPLANT
SUT PROLENE 2 0 CT2 30 (SUTURE) ×4 IMPLANT
SUT VIC AB 3-0 SH 27 (SUTURE)
SUT VIC AB 3-0 SH 27XBRD (SUTURE) IMPLANT
SYR CONTROL 10ML LL (SYRINGE) ×2 IMPLANT
TOWEL OR 17X26 10 PK STRL BLUE (TOWEL DISPOSABLE) ×2 IMPLANT
TOWEL OR NON WOVEN STRL DISP B (DISPOSABLE) ×2 IMPLANT

## 2017-01-21 NOTE — Anesthesia Postprocedure Evaluation (Signed)
Anesthesia Post Note  Patient: Marvin Garcia  Procedure(s) Performed: Procedure(s) (LRB): UMBILICAL HERNIA REPAIR WITH MESH (N/A) INSERTION OF MESH (N/A)     Patient location during evaluation: PACU Anesthesia Type: General Level of consciousness: awake and sedated Pain management: pain level controlled Vital Signs Assessment: post-procedure vital signs reviewed and stable Respiratory status: spontaneous breathing Cardiovascular status: stable Postop Assessment: no signs of nausea or vomiting Anesthetic complications: no    Last Vitals:  Vitals:   01/21/17 1245 01/21/17 1257  BP: 132/85 129/88  Pulse: 67 68  Resp: 13 16  Temp: 36.6 C 36.7 C    Last Pain:  Vitals:   01/21/17 0822  TempSrc: Oral   Pain Goal: Patients Stated Pain Goal: 3 (01/21/17 0938)               Shawneequa Baldridge JR,JOHN Mateo Flow

## 2017-01-21 NOTE — Anesthesia Preprocedure Evaluation (Signed)
Anesthesia Evaluation  Patient identified by MRN, date of birth, ID band Patient awake    Reviewed: Allergy & Precautions, NPO status , Patient's Chart, lab work & pertinent test results, reviewed documented beta blocker date and time   Airway Mallampati: II       Dental no notable dental hx. (+) Teeth Intact   Pulmonary neg pulmonary ROS,    Pulmonary exam normal breath sounds clear to auscultation       Cardiovascular hypertension, Pt. on medications and Pt. on home beta blockers Normal cardiovascular exam Rhythm:Regular Rate:Normal     Neuro/Psych negative psych ROS   GI/Hepatic negative GI ROS, Neg liver ROS,   Endo/Other  Morbid obesity  Renal/GU Renal InsufficiencyRenal disease  negative genitourinary   Musculoskeletal   Abdominal (+) + obese,   Peds  Hematology negative hematology ROS (+)   Anesthesia Other Findings   Reproductive/Obstetrics                             Anesthesia Physical Anesthesia Plan  ASA: III  Anesthesia Plan: General   Post-op Pain Management:    Induction: Intravenous  PONV Risk Score and Plan: 4 or greater and Ondansetron, Dexamethasone, Propofol, Midazolam and Scopolamine patch - Pre-op  Airway Management Planned: Oral ETT  Additional Equipment:   Intra-op Plan:   Post-operative Plan: Extubation in OR  Informed Consent: I have reviewed the patients History and Physical, chart, labs and discussed the procedure including the risks, benefits and alternatives for the proposed anesthesia with the patient or authorized representative who has indicated his/her understanding and acceptance.   Dental advisory given  Plan Discussed with: CRNA and Surgeon  Anesthesia Plan Comments:         Anesthesia Quick Evaluation

## 2017-01-21 NOTE — Transfer of Care (Signed)
Immediate Anesthesia Transfer of Care Note  Patient: Marvin Garcia  Procedure(s) Performed: Procedure(s): UMBILICAL HERNIA REPAIR WITH MESH (N/A) INSERTION OF MESH (N/A)  Patient Location: PACU  Anesthesia Type:General  Level of Consciousness:  sedated, patient cooperative and responds to stimulation  Airway & Oxygen Therapy:Patient Spontanous Breathing and Patient connected to face mask oxgen  Post-op Assessment:  Report given to PACU RN and Post -op Vital signs reviewed and stable  Post vital signs:  Reviewed and stable  Last Vitals:  Vitals:   01/21/17 0822  BP: (!) 184/98  Pulse: 61  Resp: 18  Temp: 29.2 C    Complications: No apparent anesthesia complications

## 2017-01-21 NOTE — Anesthesia Procedure Notes (Signed)
Procedure Name: Intubation Date/Time: 01/21/2017 10:15 AM Performed by: Noya Santarelli, Virgel Gess Pre-anesthesia Checklist: Patient identified, Emergency Drugs available, Suction available, Patient being monitored and Timeout performed Patient Re-evaluated:Patient Re-evaluated prior to inductionOxygen Delivery Method: Circle system utilized Preoxygenation: Pre-oxygenation with 100% oxygen Intubation Type: IV induction Ventilation: Mask ventilation without difficulty Laryngoscope Size: Mac and 4 Grade View: Grade II Tube type: Oral Tube size: 7.5 mm Number of attempts: 1 Airway Equipment and Method: Stylet Placement Confirmation: ETT inserted through vocal cords under direct vision,  positive ETCO2,  CO2 detector and breath sounds checked- equal and bilateral Secured at: 22 cm Tube secured with: Tape Dental Injury: Teeth and Oropharynx as per pre-operative assessment

## 2017-01-21 NOTE — Op Note (Signed)
Operative Note  Marvin Garcia  [id number]  [id number]  01/21/2017   Surgeon: Clovis Riley  Assistant: OR staff  Procedure performed: open repair incarcerated umbilical hernia with mesh  Preop diagnosis: incarcerated umbilical hernia Post-op diagnosis/intraop findings: same  Specimens: hernia sac Retained items: none EBL: [id number] Complications: none  Description of procedure: After obtaining informed consent the patient was taken to the operating room and placed supine on operating room table wheregeneral endotracheal anesthesia was initiated, preoperative antibiotics were administered, SCDs applied, and a formal timeout was performed. The abdomen was prepped and draped in the usual sterile fashion. An infraumbilical incision was made in the soft tissues dissected with cautery and blunt dissection until the umbilical stalk had been freed and the hernia sac isolated from the surrounding structures and overlying skin. He is found to have a multilobulated chronically incarcerated sac filled with preperitoneal fat. There was a 1.5 cm firm nodule within the hernia sac consistent with calcified fat. The sac was opened and then carefully excised using cautery. He had a tremendous amount of incarcerated preperitoneal fat and a very redundant median umbilical ligament which formed part of the component of the hernia sac. Given the nodule within the sac I elected to send the sac for pathology. Hemostasis was ensured within the wound. The hernia defect measured approximately 2 cm in maximum diameter. The surrounding fascia was cleared off and then a medium piece of ventral ex mesh was selected and introduced the field. This was directed to life flat and it was confirmed that it was flush with the abdominal wall without any entrapped structures. This was tacked bilaterally, superiorly and inferiorly with 0 Ethibond sutures to the overlying fascia. The fascial defect was closed transversely with  interrupted figure-of-eight 0 Ethibonds and reinforced with 2-0 Prolenes. Hemostasis was ensured within the wound and then local was infiltrated in the fascia and preperitoneal space as well as within the soft tissues and subcutaneous tissues to form a field block. The exposed soft tissue and the wound was field cauterized to induce scar formation. The umbilical skin was then tacked back down to the fascia with a 3-0 Vicryl. The skin was closed with running subcuticular Monocryl. Benzoin Steri-Strips were applied followed by cotton ball and Tegaderm to form a pressure dressing. The patient was then awakened, extubated and taken to PACU in stable condition.   All counts were correct at the completion of the case.

## 2017-01-21 NOTE — H&P (Signed)
Marvin Garcia Patient #: [id number] DOB: 1956-04-02 Married / Language: English / Race: Black or African American Male   History of Present Illness (Chelsea A. Kae Heller MD; 12/09/2016 3:55 PM) Patient words: This is a very nice 61 year old gentleman who presents with umbilical hernia. It has been present for several years and does not really bother him however a few weeks ago he developed a hard nodule at the superior aspect of it which was somewhat tender. He states that the nodule has gone away and is no longer hurting him. He has not had any issues with nausea, vomiting, abdominal distention or constipation. He has had a left inguinal hernia repaired many years ago but no prior abdominal surgeries. He works for [employer] and has done so since 1993.   Past Surgical History  Knee Surgery  Left.  Diagnostic Studies History  Colonoscopy  5-10 years ago  Allergies OxyCODONE HCl *ANALGESICS - OPIOID*  hiccups  Medication History  Uloric (40MG  Tablet, Oral) Active. Atenolol (100MG  Tablet, Oral) Active. Atorvastatin Calcium (20MG  Tablet, Oral) Active. Calcitriol (0.25MCG Capsule, Oral) Active. CloNIDine HCl (0.1MG  Tablet, Oral) Active. Furosemide (40MG  Tablet, Oral) Active. HydrALAZINE HCl (100MG  Tablet, Oral) Active. Imipramine HCl (25MG  Tablet, Oral) Active. Irbesartan (300MG  Tablet, Oral) Active. Vitamin C (1000MG  Tablet, Oral) Active. Multi For Him (Oral) Active. CoQ-10 (100MG  Capsule, Oral) Active. Diclofenac Sodium (50MG  Tablet, Oral) Active. Medications Reconciled  Social History  Alcohol use  Remotely quit alcohol use. Caffeine use  Coffee, Tea. No drug use  Tobacco use  Never smoker.  Family History  Arthritis  Sister. Diabetes Mellitus  Mother. Heart Disease  Father. Hypertension  Father.  Other Problems Arthritis  High blood pressure  Hypercholesterolemia     Review of Systems  General Not Present-  Appetite Loss, Chills, Fatigue, Fever, Night Sweats, Weight Gain and Weight Loss. Skin Not Present- Change in Wart/Mole, Dryness, Hives, Jaundice, New Lesions, Non-Healing Wounds, Rash and Ulcer. HEENT Present- Wears glasses/contact lenses. Not Present- Earache, Hearing Loss, Hoarseness, Nose Bleed, Oral Ulcers, Ringing in the Ears, Seasonal Allergies, Sinus Pain, Sore Throat, Visual Disturbances and Yellow Eyes. Respiratory Not Present- Bloody sputum, Chronic Cough, Difficulty Breathing, Snoring and Wheezing. Breast Not Present- Breast Mass, Breast Pain, Nipple Discharge and Skin Changes. Cardiovascular Not Present- Chest Pain, Difficulty Breathing Lying Down, Leg Cramps, Palpitations, Rapid Heart Rate, Shortness of Breath and Swelling of Extremities. Gastrointestinal Not Present- Abdominal Pain, Bloating, Bloody Stool, Change in Bowel Habits, Chronic diarrhea, Constipation, Difficulty Swallowing, Excessive gas, Gets full quickly at meals, Hemorrhoids, Indigestion, Nausea, Rectal Pain and Vomiting. Male Genitourinary Not Present- Blood in Urine, Change in Urinary Stream, Frequency, Impotence, Nocturia, Painful Urination, Urgency and Urine Leakage. Musculoskeletal Present- Joint Pain. Not Present- Back Pain, Joint Stiffness, Muscle Pain, Muscle Weakness and Swelling of Extremities. Psychiatric Not Present- Anxiety, Bipolar, Change in Sleep Pattern, Depression, Fearful and Frequent crying.  Vitals:   01/21/17 0822  BP: (!) 184/98  Pulse: 61  Resp: 18  Temp: 98.8 F (37.1 C)      Physical Exam  General Note: Alert and well-appearing   Integumentary Note: No lesions or rashes limited skin exam   Eye Note: Anicteric, extraocular motion intact   Chest and Lung Exam Note: Unlabored respirations, symmetrical air entry   Cardiovascular Note: Regular rate and rhythm, no pedal edema   Abdomen Note: Obese, nontender, nondistended. There is a reducible umbilical  hernia. Fascial defect is approximately 1/2 cm in diameter. He has a wide midline diastases superiorly. No  palpable mass or organomegaly   Neurologic Note: Grossly intact, antalgic gait due to l hip pain   Neuropsychiatric Note: Normal mood and affect, appropriate insight   Musculoskeletal Note: Strength symmetrical throughout, no deformity     Assessment & Plan  UMBILICAL HERNIA (M75.4) Story: Minimally symptomatic. He likely had an episode of incarcerated fat which is reduced itself. We discussed hernia repair both laparoscopic and open with use of mesh. Discussed risks of bleeding, infection, pain, scarring, injury to injure abdominal structures, hernia recurrence as well as the risks of general anesthesia. We also discussed the option of nonoperative management including the risks of incarceration, strangulation, increasing size and pain from the hernia, as well as signs and symptoms to monitor for that should prompt him to go to the ER for emergent evaluation and possible surgery. His questions were answered. I recommended proceeding with open umbilical hernia repair, likely with mesh.

## 2017-01-21 NOTE — Discharge Instructions (Signed)
General Anesthesia, Adult, Care After These instructions provide you with information about caring for yourself after your procedure. Your health care provider may also give you more specific instructions. Your treatment has been planned according to current medical practices, but problems sometimes occur. Call your health care provider if you have any problems or questions after your procedure. What can I expect after the procedure? After the procedure, it is common to have:  Vomiting.  A sore throat.  Mental slowness.  It is common to feel:  Nauseous.  Cold or shivery.  Sleepy.  Tired.  Sore or achy, even in parts of your body where you did not have surgery.  Follow these instructions at home: For at least 24 hours after the procedure:  Do not: ? Participate in activities where you could fall or become injured. ? Drive. ? Use heavy machinery. ? Drink alcohol. ? Take sleeping pills or medicines that cause drowsiness. ? Make important decisions or sign legal documents. ? Take care of children on your own.  Rest. Eating and drinking  If you vomit, drink water, juice, or soup when you can drink without vomiting.  Drink enough fluid to keep your urine clear or pale yellow.  Make sure you have little or no nausea before eating solid foods.  Follow the diet recommended by your health care provider. General instructions  Have a responsible adult stay with you until you are awake and alert.  Return to your normal activities as told by your health care provider. Ask your health care provider what activities are safe for you.  Take over-the-counter and prescription medicines only as told by your health care provider.  If you smoke, do not smoke without supervision.  Keep all follow-up visits as told by your health care provider. This is important. Contact a health care provider if:  You continue to have nausea or vomiting at home, and medicines are not helpful.  You  cannot drink fluids or start eating again.  You cannot urinate after 8-12 hours.  You develop a skin rash.  You have fever.  You have increasing redness at the site of your procedure. Get help right away if:  You have difficulty breathing.  You have chest pain.  You have unexpected bleeding.  You feel that you are having a life-threatening or urgent problem. This information is not intended to replace advice given to you by your health care provider. Make sure you discuss any questions you have with your health care provider. Document Released: 11/03/2000 Document Revised: 12/31/2015 Document Reviewed: 07/12/2015 Elsevier Interactive Patient Education  2018 [place]: POST OP INSTRUCTIONS  ######################################################################  EAT Gradually transition to a high fiber diet with a fiber supplement over the next few weeks after discharge.  Start with a pureed / full liquid diet (see below)  WALK Walk an hour a day.  Control your pain to do that.    CONTROL PAIN Control pain so that you can walk, sleep, tolerate sneezing/coughing, go up/down stairs.  HAVE A BOWEL MOVEMENT DAILY Keep your bowels regular to avoid problems.  OK to try a laxative to override constipation.  OK to use an antidairrheal to slow down diarrhea.  Call if not better after 2 tries  CALL IF YOU HAVE PROBLEMS/CONCERNS Call if you are still struggling despite following these instructions. Call if you have concerns not answered by these instructions  ######################################################################    1. DIET: Follow a light bland diet the first 24 hours after arrival  home, such as soup, liquids, crackers, etc.  Be sure to include lots of fluids daily.  Avoid fast food or heavy meals as your are more likely to get nauseated.  Eat a low fat the next few days after surgery. 2. Take your usually prescribed home medications unless  otherwise directed. 3. PAIN CONTROL: a. Pain is best controlled by a usual combination of three different methods TOGETHER: i. Ice/Heat ii. Over the counter pain medication iii. Prescription pain medication b. Most patients will experience some swelling and bruising around the hernia(s) such as the bellybutton, groins, or old incisions.  Ice packs or heating pads (30-60 minutes up to 6 times a day) will help. Use ice for the first few days to help decrease swelling and bruising, then switch to heat to help relax tight/sore spots and speed recovery.  Some people prefer to use ice alone, heat alone, alternating between ice & heat.  Experiment to what works for you.  Swelling and bruising can take several weeks to resolve.   c. It is helpful to take an over-the-counter pain medication regularly for the first few weeks.  Choose one of the following that works best for you: i. Naproxen (Aleve, etc)  Two 220mg  tabs twice a day ii. Ibuprofen (Advil, etc) Three 200mg  tabs four times a day (every meal & bedtime) iii. Acetaminophen (Tylenol, etc) 325-650mg  four times a day (every meal & bedtime) d. A  prescription for pain medication should be given to you upon discharge.  Take your pain medication as prescribed.  i. If you are having problems/concerns with the prescription medicine (does not control pain, nausea, vomiting, rash, itching, etc), please call us [phone] to see if we need to switch you to a different pain medicine that will work better for you and/or control your side effect better. ii. If you need a refill on your pain medication, please contact your pharmacy.  They will contact our office to request authorization. Prescriptions will not be filled after 5 pm or on week-ends. 4. Avoid getting constipated.  Between the surgery and the pain medications, it is common to experience some constipation.  Increasing fluid intake and taking a fiber supplement (such as Metamucil, Citrucel, FiberCon,  MiraLax, etc) 1-2 times a day regularly will usually help prevent this problem from occurring.  A mild laxative (prune juice, Milk of Magnesia, MiraLax, etc) should be taken according to package directions if there are no bowel movements after 48 hours.   5. Wash / shower every day.  You may shower over the dressing which is waterproof.  Let the soap and water run over incisions, do not rub or scrub. No lotions or ointments to incisions.  6. Remove clear dressing and cotton balls 48 hours after surgery. The steri strips will peel off after about 2 weeks.  You may leave the incision open to air.  You may replace a dressing/Band-Aid to cover the incision for comfort if you wish.  Continue to shower over incision(s) after the dressing is off.    7. ACTIVITIES as tolerated:   a. You may resume regular (light) daily activities beginning the next day--such as daily self-care, walking, climbing stairs--gradually increasing activities as tolerated.  If you can walk 30 minutes without difficulty, it is safe to try more intense activity such as jogging, treadmill, bicycling, low-impact aerobics, swimming, etc. b. Save the most intensive and strenuous activity for last such as sit-ups, heavy lifting, contact sports, etc  Refrain from any heavy lifting  or straining until you are off narcotics for pain control and about 6 weeks after surgery.   c. DO NOT PUSH THROUGH PAIN.  Let pain be your guide: If it hurts to do something, don't do it.  Pain is your body warning you to avoid that activity for another week until the pain goes down. d. You may drive when you are no longer taking prescription pain medication, you can comfortably wear a seatbelt, and you can safely maneuver your car and apply brakes. e. Dennis Bast may have sexual intercourse when it is comfortable.  8. FOLLOW UP in our office a. Please call [facility] at [phone] to set up an appointment to see your surgeon in the office for a follow-up appointment  approximately 2-3 weeks after your surgery. b. Make sure that you call for this appointment the day you arrive home to insure a convenient appointment time. 9.  IF YOU HAVE DISABILITY OR FAMILY LEAVE FORMS, BRING THEM TO THE OFFICE FOR PROCESSING.  DO NOT GIVE THEM TO YOUR DOCTOR.  WHEN TO CALL us [phone]: 1. Poor pain control 2. Reactions / problems with new medications (rash/itching, nausea, etc)  3. Fever over 101.5 F (38.5 C) 4. Inability to urinate 5. Nausea and/or vomiting 6. Worsening swelling or bruising 7. Continued bleeding from incision. 8. Increased pain, redness, or drainage from the incision   The clinic staff is available to answer your questions during regular business hours (8:30am-5pm).  Please dont hesitate to call and ask to speak to one of our nurses for clinical concerns.   If you have a medical emergency, go to the nearest emergency room or call 911.  A surgeon from [facility] is always on call at the hospitals in [facility], [place], [place], [place], [place]  [address] ?  [address] [address], [place], [place]   [address] MAIN: [phone] ? TOLL FREE: [phone] ? FAX: [phone] [email or web address]

## 2017-01-22 ENCOUNTER — Encounter (HOSPITAL_COMMUNITY): Payer: Self-pay | Admitting: Surgery

## 2017-02-10 DIAGNOSIS — M1612 Unilateral primary osteoarthritis, left hip: Secondary | ICD-10-CM | POA: Diagnosis not present

## 2017-02-26 ENCOUNTER — Ambulatory Visit: Payer: Self-pay | Admitting: Orthopedic Surgery

## 2017-02-26 DIAGNOSIS — N184 Chronic kidney disease, stage 4 (severe): Secondary | ICD-10-CM | POA: Diagnosis not present

## 2017-02-26 DIAGNOSIS — I129 Hypertensive chronic kidney disease with stage 1 through stage 4 chronic kidney disease, or unspecified chronic kidney disease: Secondary | ICD-10-CM | POA: Diagnosis not present

## 2017-02-26 DIAGNOSIS — N2581 Secondary hyperparathyroidism of renal origin: Secondary | ICD-10-CM | POA: Diagnosis not present

## 2017-02-26 DIAGNOSIS — R809 Proteinuria, unspecified: Secondary | ICD-10-CM | POA: Diagnosis not present

## 2017-03-03 ENCOUNTER — Ambulatory Visit: Payer: Self-pay | Admitting: Orthopedic Surgery

## 2017-03-03 NOTE — H&P (Signed)
TOTAL HIP ADMISSION H&P  Patient is admitted for left total hip arthroplasty.  Subjective:  Chief Complaint: left hip pain  HPI: Marvin Garcia, 61 y.o. male, has a history of pain and functional disability in the left hip(s) due to arthritis and patient has failed non-surgical conservative treatments for greater than 12 weeks to include NSAID's and/or analgesics, corticosteriod injections, supervised PT with diminished ADL's post treatment, use of assistive devices, weight reduction as appropriate and activity modification.  Onset of symptoms was gradual starting 1 years ago with gradually worsening course since that time.The patient noted no past surgery on the left hip(s).  Patient currently rates pain in the left hip at 10 out of 10 with activity. Patient has night pain, worsening of pain with activity and weight bearing, trendelenberg gait, pain that interfers with activities of daily living, pain with passive range of motion and crepitus. Patient has evidence of subchondral cysts, subchondral sclerosis, periarticular osteophytes and joint space narrowing by imaging studies. This condition presents safety issues increasing the risk of falls.  There is no current active infection.  There are no active problems to display for this patient.  Past Medical History:  Diagnosis Date  . Arrhythmia 1991   patient  describes that he woke up surrounded by doctors saying that his "heart had stopped while sleeping"; denies having pain during this event; also denies any reccurrent issues after that episode; says that was "when they told me i had high blood pressure"   . Arthritis   . Headache   . Hypertension   . Umbilical hernia     Past Surgical History:  Procedure Laterality Date  . HERNIA REPAIR     inguinal   . INSERTION OF MESH N/A 01/21/2017   Procedure: INSERTION OF MESH;  Surgeon: Clovis Riley, MD;  Location: [facility];  Service: General;  Laterality: N/A;  . JOINT REPLACEMENT    .  KNEE ARTHROSCOPY    . TOTAL KNEE ARTHROPLASTY  06/04/2012   Procedure: TOTAL KNEE ARTHROPLASTY;  Surgeon: Sydnee Cabal, MD;  Location: [facility];  Service: Orthopedics;  Laterality: Left;  . UMBILICAL HERNIA REPAIR N/A 01/21/2017   Procedure: UMBILICAL HERNIA REPAIR WITH MESH;  Surgeon: Clovis Riley, MD;  Location: [facility];  Service: General;  Laterality: N/A;     (Not in a hospital admission) Allergies  Allergen Reactions  . Hydrocodone Other (See Comments)    Severe hiccups    Social History  Substance Use Topics  . Smoking status: Never Smoker  . Smokeless tobacco: Never Used  . Alcohol use No    Family History  Problem Relation Age of Onset  . Diabetes Mother   . Heart disease Father   . Asthma Son   . Diabetes Maternal Grandmother      Review of Systems  Constitutional: Negative.   HENT: Negative.   Eyes: Negative.   Cardiovascular: Negative.   Gastrointestinal: Negative.   Genitourinary: Negative.   Musculoskeletal: Positive for joint pain.  Skin: Negative.   Neurological: Positive for headaches.  Endo/Heme/Allergies: Negative.   Psychiatric/Behavioral: Negative.     Objective:  Physical Exam  Constitutional: He is oriented to person, place, and time. He appears well-developed and well-nourished.  HENT:  Head: Normocephalic and atraumatic.  Eyes: Pupils are equal, round, and reactive to light. Conjunctivae and EOM are normal.  Neck: Normal range of motion. Neck supple.  Cardiovascular: Normal rate, regular rhythm and intact distal pulses.   Respiratory: Effort normal. No respiratory distress.  GI: Soft. He exhibits no distension.  Genitourinary:  Genitourinary Comments: deferred  Musculoskeletal:       Left hip: He exhibits decreased range of motion and decreased strength.  Neurological: He is alert and oriented to person, place, and time. He has normal reflexes.  Skin: Skin is warm and dry.  Psychiatric: He has a normal mood and affect. His behavior  is normal. Judgment and thought content normal.    Vital signs in last 24 hours: @VSRANGES @  Labs:   Estimated body mass index is 39 kg/m as calculated from the following:   Height as of 01/21/17: 5\' 7"  (1.702 m).   Weight as of 01/21/17: 112.9 kg (249 lb).   Imaging Review Plain radiographs demonstrate severe degenerative joint disease of the left hip(s). The bone quality appears to be adequate for age and reported activity level.  Assessment/Plan:  End stage arthritis, left hip(s)  The patient history, physical examination, clinical judgement of the provider and imaging studies are consistent with end stage degenerative joint disease of the left hip(s) and total hip arthroplasty is deemed medically necessary. The treatment options including medical management, injection therapy, arthroscopy and arthroplasty were discussed at length. The risks and benefits of total hip arthroplasty were presented and reviewed. The risks due to aseptic loosening, infection, stiffness, dislocation/subluxation,  thromboembolic complications and other imponderables were discussed.  The patient acknowledged the explanation, agreed to proceed with the plan and consent was signed. Patient is being admitted for inpatient treatment for surgery, pain control, PT, OT, prophylactic antibiotics, VTE prophylaxis, progressive ambulation and ADL's and discharge planning.The patient is planning to be discharged home with HEP. CKD.

## 2017-03-09 ENCOUNTER — Other Ambulatory Visit (HOSPITAL_COMMUNITY): Payer: Self-pay | Admitting: Emergency Medicine

## 2017-03-09 NOTE — Progress Notes (Signed)
Clearance Dr Alroy Dust on chart  Clearance Dr Kae Heller on chart  EKG 01-24-17 epic

## 2017-03-09 NOTE — Patient Instructions (Addendum)
Marvin Garcia  03/09/2017   Your procedure is scheduled on: 03-19-17  Report to [facility] Main  Entrance Take [facility]  elevators to 3rd floor to  [place] at 2:00PM.     Call this number if you have problems the morning of surgery [phone]   Remember: ONLY 1 PERSON MAY GO WITH YOU TO SHORT STAY TO GET  READY MORNING OF [place].  Do not eat food After Midnight. You may have clear liquids from midnight until 10am day of surgery . Nothing by mouth after 10am!     Take these medicines the morning of surgery with A SIP OF WATER: atenolol(tenormin), clonidine(catapres), hydralazine, uloric, tylenol as needed                                You may not have any metal on your body including hair pins and              piercings  Do not wear jewelry, make-up, lotions, powders or perfumes, deodorant              Men may shave face and neck.   Do not bring valuables to the hospital. [place].  Contacts, dentures or bridgework may not be worn into surgery.  Leave suitcase in the car. After surgery it may be brought to your room.               Please read over the following fact sheets you were given: _____________________________________________________________________        CLEAR LIQUID DIET   Foods Allowed                                                                     Foods Excluded  Coffee and tea, regular and decaf                             liquids that you cannot  Plain Jell-O in any flavor                                             see through such as: Fruit ices (not with fruit pulp)                                     milk, soups, orange juice  Iced Popsicles                                    All solid food Carbonated beverages, regular and diet  Cranberry, grape and apple juices Sports drinks like Gatorade Lightly seasoned clear broth or  consume(fat free) Sugar, honey syrup  Sample Menu Breakfast                                Lunch                                     Supper Cranberry juice                    Beef broth                            Chicken broth Jell-O                                     Grape juice                           Apple juice Coffee or tea                        Jell-O                                      Popsicle                                                Coffee or tea                        Coffee or tea  _____________________________________________________________________  [facility] - Preparing for Surgery Before surgery, you can play an important role.  Because skin is not sterile, your skin needs to be as free of germs as possible.  You can reduce the number of germs on your skin by washing with CHG (chlorahexidine gluconate) soap before surgery.  CHG is an antiseptic cleaner which kills germs and bonds with the skin to continue killing germs even after washing. Please DO NOT use if you have an allergy to CHG or antibacterial soaps.  If your skin becomes reddened/irritated stop using the CHG and inform your nurse when you arrive at Short Stay. Do not shave (including legs and underarms) for at least 48 hours prior to the first CHG shower.  You may shave your face/neck. Please follow these instructions carefully:  1.  Shower with CHG Soap the night before surgery and the  morning of Surgery.  2.  If you choose to wash your hair, wash your hair first as usual with your  normal  shampoo.  3.  After you shampoo, rinse your hair and body thoroughly to remove the  shampoo.                           4.  Use CHG as you would any other liquid soap.  You can apply chg directly  to the skin and wash  Gently with a scrungie or clean washcloth.  5.  Apply the CHG Soap to your body ONLY FROM THE NECK DOWN.   Do not use on face/ open                           Wound or open sores.  Avoid contact with eyes, ears mouth and genitals (private parts).                       Wash face,  Genitals (private parts) with your normal soap.             6.  Wash thoroughly, paying special attention to the area where your surgery  will be performed.  7.  Thoroughly rinse your body with warm water from the neck down.  8.  DO NOT shower/wash with your normal soap after using and rinsing off  the CHG Soap.                9.  Pat yourself dry with a clean towel.            10.  Wear clean pajamas.            11.  Place clean sheets on your bed the night of your first shower and do not  sleep with pets. Day of Surgery : Do not apply any lotions/deodorants the morning of surgery.  Please wear clean clothes to the hospital/surgery center.  FAILURE TO FOLLOW THESE INSTRUCTIONS MAY RESULT IN THE CANCELLATION OF YOUR SURGERY PATIENT SIGNATURE_________________________________  NURSE SIGNATURE__________________________________  ________________________________________________________________________   Marvin Garcia  An incentive spirometer is a tool that can help keep your lungs clear and active. This tool measures how well you are filling your lungs with each breath. Taking long deep breaths may help reverse or decrease the chance of developing breathing (pulmonary) problems (especially infection) following:  A long period of time when you are unable to move or be active. BEFORE THE PROCEDURE   If the spirometer includes an indicator to show your best effort, your nurse or respiratory therapist will set it to a desired goal.  If possible, sit up straight or lean slightly forward. Try not to slouch.  Hold the incentive spirometer in an upright position. INSTRUCTIONS FOR USE  1. Sit on the edge of your bed if possible, or sit up as far as you can in bed or on a chair. 2. Hold the incentive spirometer in an upright position. 3. Breathe out normally. 4. Place the mouthpiece in your  mouth and seal your lips tightly around it. 5. Breathe in slowly and as deeply as possible, raising the piston or the ball toward the top of the column. 6. Hold your breath for 3-5 seconds or for as long as possible. Allow the piston or ball to fall to the bottom of the column. 7. Remove the mouthpiece from your mouth and breathe out normally. 8. Rest for a few seconds and repeat Steps 1 through 7 at least 10 times every 1-2 hours when you are awake. Take your time and take a few normal breaths between deep breaths. 9. The spirometer may include an indicator to show your best effort. Use the indicator as a goal to work toward during each repetition. 10. After each set of 10 deep breaths, practice coughing to be sure your lungs are clear. If you have an incision (the cut made at the time of  surgery), support your incision when coughing by placing a pillow or rolled up towels firmly against it. Once you are able to get out of bed, walk around indoors and cough well. You may stop using the incentive spirometer when instructed by your caregiver.  RISKS AND COMPLICATIONS  Take your time so you do not get dizzy or light-headed.  If you are in pain, you may need to take or ask for pain medication before doing incentive spirometry. It is harder to take a deep breath if you are having pain. AFTER USE  Rest and breathe slowly and easily.  It can be helpful to keep track of a log of your progress. Your caregiver can provide you with a simple table to help with this. If you are using the spirometer at home, follow these instructions: [place] IF:   You are having difficultly using the spirometer.  You have trouble using the spirometer as often as instructed.  Your pain medication is not giving enough relief while using the spirometer.  You develop fever of 100.5 F (38.1 C) or higher. SEEK IMMEDIATE MEDICAL CARE IF:   You cough up bloody sputum that had not been present before.  You  develop fever of 102 F (38.9 C) or greater.  You develop worsening pain at or near the incision site. MAKE SURE YOU:   Understand these instructions.  Will watch your condition.  Will get help right away if you are not doing well or get worse. Document Released: 12/08/2006 Document Revised: 10/20/2011 Document Reviewed: 02/08/2007 ExitCare Patient Information 2014 ExitCare, [place].   ________________________________________________________________________  WHAT IS A BLOOD TRANSFUSION? Blood Transfusion Information  A transfusion is the replacement of blood or some of its parts. Blood is made up of multiple cells which provide different functions.  Red blood cells carry oxygen and are used for blood loss replacement.  White blood cells fight against infection.  Platelets control bleeding.  Plasma helps clot blood.  Other blood products are available for specialized needs, such as hemophilia or other clotting disorders. BEFORE THE TRANSFUSION  Who gives blood for transfusions?   Healthy volunteers who are fully evaluated to make sure their blood is safe. This is blood bank blood. Transfusion therapy is the safest it has ever been in the practice of medicine. Before blood is taken from a donor, a complete history is taken to make sure that person has no history of diseases nor engages in risky social behavior (examples are intravenous drug use or sexual activity with multiple partners). The donor's travel history is screened to minimize risk of transmitting infections, such as malaria. The donated blood is tested for signs of infectious diseases, such as HIV and hepatitis. The blood is then tested to be sure it is compatible with you in order to minimize the chance of a transfusion reaction. If you or a relative donates blood, this is often done in anticipation of surgery and is not appropriate for emergency situations. It takes many days to process the donated blood. RISKS AND  COMPLICATIONS Although transfusion therapy is very safe and saves many lives, the main dangers of transfusion include:   Getting an infectious disease.  Developing a transfusion reaction. This is an allergic reaction to something in the blood you were given. Every precaution is taken to prevent this. The decision to have a blood transfusion has been considered carefully by your caregiver before blood is given. Blood is not given unless the benefits outweigh the risks. AFTER THE  TRANSFUSION  Right after receiving a blood transfusion, you will usually feel much better and more energetic. This is especially true if your red blood cells have gotten low (anemic). The transfusion raises the level of the red blood cells which carry oxygen, and this usually causes an energy increase.  The nurse administering the transfusion will monitor you carefully for complications. HOME CARE INSTRUCTIONS  No special instructions are needed after a transfusion. You may find your energy is better. Speak with your caregiver about any limitations on activity for underlying diseases you may have. SEEK MEDICAL CARE IF:   Your condition is not improving after your transfusion.  You develop redness or irritation at the intravenous (IV) site. SEEK IMMEDIATE MEDICAL CARE IF:  Any of the following symptoms occur over the next 12 hours:  Shaking chills.  You have a temperature by mouth above 102 F (38.9 C), not controlled by medicine.  Chest, back, or muscle pain.  People around you feel you are not acting correctly or are confused.  Shortness of breath or difficulty breathing.  Dizziness and fainting.  You get a rash or develop hives.  You have a decrease in urine output.  Your urine turns a dark color or changes to pink, red, or brown. Any of the following symptoms occur over the next 10 days:  You have a temperature by mouth above 102 F (38.9 C), not controlled by medicine.  Shortness of  breath.  Weakness after normal activity.  The white part of the eye turns yellow (jaundice).  You have a decrease in the amount of urine or are urinating less often.  Your urine turns a dark color or changes to pink, red, or brown. Document Released: 07/25/2000 Document Revised: 10/20/2011 Document Reviewed: 03/13/2008 [facility] Patient Information 2014 [place], [place].  _______________________________________________________________________

## 2017-03-10 ENCOUNTER — Encounter (HOSPITAL_COMMUNITY): Payer: Self-pay

## 2017-03-10 ENCOUNTER — Encounter (INDEPENDENT_AMBULATORY_CARE_PROVIDER_SITE_OTHER): Payer: Self-pay

## 2017-03-10 ENCOUNTER — Encounter (HOSPITAL_COMMUNITY)
Admission: RE | Admit: 2017-03-10 | Discharge: 2017-03-10 | Disposition: A | Discharge status: Home / Self Care | Payer: 59 | Source: Ambulatory Visit | Attending: Orthopedic Surgery | Admitting: Orthopedic Surgery

## 2017-03-10 DIAGNOSIS — M1612 Unilateral primary osteoarthritis, left hip: Secondary | ICD-10-CM | POA: Diagnosis not present

## 2017-03-10 DIAGNOSIS — Z01818 Encounter for other preprocedural examination: Secondary | ICD-10-CM | POA: Diagnosis not present

## 2017-03-10 HISTORY — DX: Chronic kidney disease, unspecified: N18.9

## 2017-03-10 LAB — CBC
HEMATOCRIT: 35.3 % — AB (ref 39.0–52.0)
Hemoglobin: 12.4 g/dL — ABNORMAL LOW (ref 13.0–17.0)
MCH: 25.2 pg — ABNORMAL LOW (ref 26.0–34.0)
MCHC: 35.1 g/dL (ref 30.0–36.0)
MCV: 71.7 fL — AB (ref 78.0–100.0)
Platelets: 313 10*3/uL (ref 150–400)
RBC: 4.92 MIL/uL (ref 4.22–5.81)
RDW: 15.2 % (ref 11.5–15.5)
WBC: 10.9 10*3/uL — ABNORMAL HIGH (ref 4.0–10.5)

## 2017-03-10 LAB — SURGICAL PCR SCREEN
MRSA, PCR: NEGATIVE
Staphylococcus aureus: NEGATIVE

## 2017-03-10 NOTE — Progress Notes (Signed)
BMP, UA, results 02-26-17 on chart

## 2017-03-10 NOTE — Progress Notes (Signed)
   03/10/17 1030  OBSTRUCTIVE SLEEP APNEA  Have you ever been diagnosed with sleep apnea through a sleep study? No  Do you snore loudly (loud enough to be heard through closed doors)?  1  Do you often feel tired, fatigued, or sleepy during the daytime (such as falling asleep during driving or talking to someone)? 0  Has anyone observed you stop breathing during your sleep? 0  Do you have, or are you being treated for high blood pressure? 1  BMI more than 35 kg/m2? 1  Age > 50 (1-yes) 1  Neck circumference greater than:Male 16 inches or larger, Male 17inches or larger? 1  Male Gender (Yes=1) 1  Obstructive Sleep Apnea Score 6

## 2017-03-19 ENCOUNTER — Inpatient Hospital Stay (HOSPITAL_COMMUNITY): Payer: 59 | Admitting: Anesthesiology

## 2017-03-19 ENCOUNTER — Inpatient Hospital Stay (HOSPITAL_COMMUNITY)
Admission: RE | Admit: 2017-03-19 | Discharge: 2017-03-20 | DRG: 470 | Disposition: A | Discharge status: Home / Self Care | Payer: 59 | Source: Ambulatory Visit | Attending: Orthopedic Surgery | Admitting: Orthopedic Surgery

## 2017-03-19 ENCOUNTER — Encounter (HOSPITAL_COMMUNITY)
Admission: RE | Disposition: A | Discharge status: Home / Self Care | Payer: Self-pay | Source: Ambulatory Visit | Attending: Orthopedic Surgery

## 2017-03-19 ENCOUNTER — Inpatient Hospital Stay (HOSPITAL_COMMUNITY): Payer: 59

## 2017-03-19 ENCOUNTER — Encounter (HOSPITAL_COMMUNITY): Payer: Self-pay | Admitting: *Deleted

## 2017-03-19 DIAGNOSIS — I129 Hypertensive chronic kidney disease with stage 1 through stage 4 chronic kidney disease, or unspecified chronic kidney disease: Secondary | ICD-10-CM | POA: Diagnosis present

## 2017-03-19 DIAGNOSIS — Z885 Allergy status to narcotic agent status: Secondary | ICD-10-CM

## 2017-03-19 DIAGNOSIS — Z09 Encounter for follow-up examination after completed treatment for conditions other than malignant neoplasm: Secondary | ICD-10-CM

## 2017-03-19 DIAGNOSIS — M25752 Osteophyte, left hip: Secondary | ICD-10-CM | POA: Diagnosis present

## 2017-03-19 DIAGNOSIS — Z96652 Presence of left artificial knee joint: Secondary | ICD-10-CM | POA: Diagnosis present

## 2017-03-19 DIAGNOSIS — M1612 Unilateral primary osteoarthritis, left hip: Secondary | ICD-10-CM | POA: Diagnosis not present

## 2017-03-19 DIAGNOSIS — M25552 Pain in left hip: Secondary | ICD-10-CM | POA: Diagnosis not present

## 2017-03-19 DIAGNOSIS — Z96642 Presence of left artificial hip joint: Secondary | ICD-10-CM | POA: Diagnosis not present

## 2017-03-19 DIAGNOSIS — Z471 Aftercare following joint replacement surgery: Secondary | ICD-10-CM | POA: Diagnosis not present

## 2017-03-19 DIAGNOSIS — N184 Chronic kidney disease, stage 4 (severe): Secondary | ICD-10-CM | POA: Diagnosis present

## 2017-03-19 DIAGNOSIS — Z8249 Family history of ischemic heart disease and other diseases of the circulatory system: Secondary | ICD-10-CM

## 2017-03-19 HISTORY — PX: TOTAL HIP ARTHROPLASTY: SHX124

## 2017-03-19 LAB — CBC
HEMATOCRIT: 35.9 % — AB (ref 39.0–52.0)
HEMOGLOBIN: 12.3 g/dL — AB (ref 13.0–17.0)
MCH: 25 pg — ABNORMAL LOW (ref 26.0–34.0)
MCHC: 34.3 g/dL (ref 30.0–36.0)
MCV: 73 fL — AB (ref 78.0–100.0)
Platelets: 353 10*3/uL (ref 150–400)
RBC: 4.92 MIL/uL (ref 4.22–5.81)
RDW: 14.8 % (ref 11.5–15.5)
WBC: 11.8 10*3/uL — AB (ref 4.0–10.5)

## 2017-03-19 LAB — BASIC METABOLIC PANEL
ANION GAP: 12 (ref 5–15)
BUN: 40 mg/dL — AB (ref 6–20)
CHLORIDE: 105 mmol/L (ref 101–111)
CO2: 26 mmol/L (ref 22–32)
Calcium: 9.8 mg/dL (ref 8.9–10.3)
Creatinine, Ser: 2.68 mg/dL — ABNORMAL HIGH (ref 0.61–1.24)
GFR calc Af Amer: 28 mL/min — ABNORMAL LOW (ref >60)
GFR calc non Af Amer: 24 mL/min — ABNORMAL LOW (ref >60)
GLUCOSE: 103 mg/dL — AB (ref 65–99)
Potassium: 3.3 mmol/L — ABNORMAL LOW (ref 3.5–5.1)
Sodium: 143 mmol/L (ref 135–145)

## 2017-03-19 LAB — TYPE AND SCREEN
ABO/RH(D): O POS
ANTIBODY SCREEN: NEGATIVE

## 2017-03-19 SURGERY — ARTHROPLASTY, HIP, TOTAL, ANTERIOR APPROACH
Anesthesia: General | Site: Hip | Laterality: Left

## 2017-03-19 MED ORDER — CEFAZOLIN SODIUM-DEXTROSE 2-4 GM/100ML-% IV SOLN
2.0000 g | INTRAVENOUS | Status: AC
  Administered 2017-03-19: 2 g via INTRAVENOUS
  Filled 2017-03-19: qty 100

## 2017-03-19 MED ORDER — CHLORHEXIDINE GLUCONATE 4 % EX LIQD
60.0000 mL | Freq: Once | CUTANEOUS | Status: AC
  Administered 2017-03-19: 4 via TOPICAL

## 2017-03-19 MED ORDER — MIDAZOLAM HCL 5 MG/5ML IJ SOLN
INTRAMUSCULAR | Status: DC | PRN
  Administered 2017-03-19: 2 mg via INTRAVENOUS

## 2017-03-19 MED ORDER — SENNA 8.6 MG PO TABS
2.0000 | ORAL_TABLET | Freq: Every day | ORAL | Status: DC
  Administered 2017-03-19: 17.2 mg via ORAL
  Filled 2017-03-19: qty 2

## 2017-03-19 MED ORDER — FENTANYL CITRATE (PF) 100 MCG/2ML IJ SOLN
INTRAMUSCULAR | Status: AC
  Filled 2017-03-19: qty 2

## 2017-03-19 MED ORDER — HYDRALAZINE HCL 50 MG PO TABS
100.0000 mg | ORAL_TABLET | Freq: Three times a day (TID) | ORAL | Status: DC
  Administered 2017-03-19 – 2017-03-20 (×3): 100 mg via ORAL
  Filled 2017-03-19 (×3): qty 2

## 2017-03-19 MED ORDER — POVIDONE-IODINE 10 % EX SWAB
2.0000 "application " | Freq: Once | CUTANEOUS | Status: AC
  Administered 2017-03-19: 2 via TOPICAL

## 2017-03-19 MED ORDER — ATENOLOL 50 MG PO TABS
100.0000 mg | ORAL_TABLET | Freq: Every day | ORAL | Status: DC
  Administered 2017-03-20: 100 mg via ORAL
  Filled 2017-03-19: qty 2

## 2017-03-19 MED ORDER — FEBUXOSTAT 40 MG PO TABS
40.0000 mg | ORAL_TABLET | Freq: Every day | ORAL | Status: DC
  Administered 2017-03-20: 40 mg via ORAL
  Filled 2017-03-19: qty 1

## 2017-03-19 MED ORDER — FUROSEMIDE 40 MG PO TABS: 40.0000 mg | ORAL_TABLET | ORAL | Status: DC

## 2017-03-19 MED ORDER — IRBESARTAN 150 MG PO TABS
300.0000 mg | ORAL_TABLET | Freq: Every day | ORAL | Status: DC
  Administered 2017-03-20: 300 mg via ORAL
  Filled 2017-03-19: qty 2

## 2017-03-19 MED ORDER — SODIUM CHLORIDE 0.9 % IJ SOLN
INTRAMUSCULAR | Status: AC
  Filled 2017-03-19: qty 50

## 2017-03-19 MED ORDER — METOCLOPRAMIDE HCL 5 MG/ML IJ SOLN: 5.0000 mg | Freq: Three times a day (TID) | INTRAMUSCULAR | Status: DC | PRN

## 2017-03-19 MED ORDER — SUFENTANIL CITRATE 50 MCG/ML IV SOLN
INTRAVENOUS | Status: AC
  Filled 2017-03-19: qty 1

## 2017-03-19 MED ORDER — SODIUM CHLORIDE 0.9 % IV SOLN
INTRAVENOUS | Status: DC | PRN
  Administered 2017-03-19 (×2): via INTRAVENOUS

## 2017-03-19 MED ORDER — ONDANSETRON HCL 4 MG/2ML IJ SOLN: 4.0000 mg | Freq: Four times a day (QID) | INTRAMUSCULAR | Status: DC | PRN

## 2017-03-19 MED ORDER — CLONIDINE HCL 0.2 MG PO TABS
0.2000 mg | ORAL_TABLET | Freq: Three times a day (TID) | ORAL | Status: DC
  Administered 2017-03-19 – 2017-03-20 (×3): 0.2 mg via ORAL
  Filled 2017-03-19: qty 1
  Filled 2017-03-19 (×2): qty 2
  Filled 2017-03-19 (×3): qty 1
  Filled 2017-03-19: qty 2

## 2017-03-19 MED ORDER — METHOCARBAMOL 500 MG PO TABS
500.0000 mg | ORAL_TABLET | Freq: Four times a day (QID) | ORAL | Status: DC | PRN
  Administered 2017-03-20: 500 mg via ORAL
  Filled 2017-03-19: qty 1

## 2017-03-19 MED ORDER — CEFAZOLIN SODIUM-DEXTROSE 2-4 GM/100ML-% IV SOLN
2.0000 g | Freq: Four times a day (QID) | INTRAVENOUS | Status: AC
  Administered 2017-03-19 – 2017-03-20 (×2): 2 g via INTRAVENOUS
  Filled 2017-03-19 (×2): qty 100

## 2017-03-19 MED ORDER — PHENOL 1.4 % MT LIQD: 1.0000 | OROMUCOSAL | Status: DC | PRN

## 2017-03-19 MED ORDER — SODIUM CHLORIDE 0.9 % IR SOLN
Status: DC | PRN
  Administered 2017-03-19: 1000 mL

## 2017-03-19 MED ORDER — METHOCARBAMOL 1000 MG/10ML IJ SOLN
500.0000 mg | Freq: Four times a day (QID) | INTRAVENOUS | Status: DC | PRN
  Administered 2017-03-20: 500 mg via INTRAVENOUS
  Filled 2017-03-19: qty 5

## 2017-03-19 MED ORDER — FUROSEMIDE 40 MG PO TABS
40.0000 mg | ORAL_TABLET | Freq: Every day | ORAL | Status: DC
  Administered 2017-03-20: 40 mg via ORAL
  Filled 2017-03-19: qty 1

## 2017-03-19 MED ORDER — PHENYLEPHRINE HCL 10 MG/ML IJ SOLN
INTRAMUSCULAR | Status: DC | PRN
  Administered 2017-03-19: 80 ug via INTRAVENOUS
  Administered 2017-03-19: 40 ug via INTRAVENOUS
  Administered 2017-03-19 (×2): 80 ug via INTRAVENOUS

## 2017-03-19 MED ORDER — GLYCOPYRROLATE 0.2 MG/ML IJ SOLN
INTRAMUSCULAR | Status: DC | PRN
  Administered 2017-03-19: 0.2 mg via INTRAVENOUS

## 2017-03-19 MED ORDER — SUGAMMADEX SODIUM 500 MG/5ML IV SOLN
INTRAVENOUS | Status: DC | PRN
  Administered 2017-03-19: 230 mg via INTRAVENOUS

## 2017-03-19 MED ORDER — SODIUM CHLORIDE 0.9 % IV SOLN: INTRAVENOUS | Status: DC

## 2017-03-19 MED ORDER — ATORVASTATIN CALCIUM 20 MG PO TABS
20.0000 mg | ORAL_TABLET | Freq: Every evening | ORAL | Status: DC
  Administered 2017-03-19 – 2017-03-20 (×2): 20 mg via ORAL
  Filled 2017-03-19 (×2): qty 1

## 2017-03-19 MED ORDER — SODIUM CHLORIDE 0.9 % IJ SOLN
INTRAMUSCULAR | Status: DC | PRN
  Administered 2017-03-19: 30 mL

## 2017-03-19 MED ORDER — FENTANYL CITRATE (PF) 100 MCG/2ML IJ SOLN
INTRAMUSCULAR | Status: DC | PRN
  Administered 2017-03-19 (×3): 50 ug via INTRAVENOUS
  Administered 2017-03-19: 100 ug via INTRAVENOUS
  Administered 2017-03-19: 50 ug via INTRAVENOUS

## 2017-03-19 MED ORDER — MIDAZOLAM HCL 2 MG/2ML IJ SOLN
INTRAMUSCULAR | Status: AC
  Filled 2017-03-19: qty 2

## 2017-03-19 MED ORDER — SODIUM CHLORIDE 0.9 % IV SOLN
INTRAVENOUS | Status: DC
  Administered 2017-03-19 – 2017-03-20 (×2): via INTRAVENOUS

## 2017-03-19 MED ORDER — ONDANSETRON HCL 4 MG PO TABS: 4.0000 mg | ORAL_TABLET | Freq: Four times a day (QID) | ORAL | Status: DC | PRN

## 2017-03-19 MED ORDER — DOCUSATE SODIUM 100 MG PO CAPS
100.0000 mg | ORAL_CAPSULE | Freq: Two times a day (BID) | ORAL | Status: DC
  Administered 2017-03-19 – 2017-03-20 (×2): 100 mg via ORAL
  Filled 2017-03-19 (×2): qty 1

## 2017-03-19 MED ORDER — DEXAMETHASONE SODIUM PHOSPHATE 10 MG/ML IJ SOLN
INTRAMUSCULAR | Status: DC | PRN
  Administered 2017-03-19: 10 mg via INTRAVENOUS

## 2017-03-19 MED ORDER — SODIUM CHLORIDE 0.9 % IV SOLN
1000.0000 mg | Freq: Once | INTRAVENOUS | Status: AC
  Administered 2017-03-19: 1000 mg via INTRAVENOUS
  Filled 2017-03-19: qty 1100

## 2017-03-19 MED ORDER — BUPIVACAINE-EPINEPHRINE (PF) 0.25% -1:200000 IJ SOLN
INTRAMUSCULAR | Status: AC
  Filled 2017-03-19: qty 30

## 2017-03-19 MED ORDER — PROPOFOL 10 MG/ML IV BOLUS
INTRAVENOUS | Status: AC
  Filled 2017-03-19: qty 20

## 2017-03-19 MED ORDER — TRANEXAMIC ACID 1000 MG/10ML IV SOLN
1000.0000 mg | INTRAVENOUS | Status: AC
  Administered 2017-03-19: 1000 mg via INTRAVENOUS
  Filled 2017-03-19: qty 1100

## 2017-03-19 MED ORDER — KETOROLAC TROMETHAMINE 30 MG/ML IJ SOLN
INTRAMUSCULAR | Status: AC
  Filled 2017-03-19: qty 1

## 2017-03-19 MED ORDER — SODIUM CHLORIDE 0.9 % IR SOLN
Status: DC | PRN
  Administered 2017-03-19: 4000 mL

## 2017-03-19 MED ORDER — ACETAMINOPHEN 650 MG RE SUPP: 650.0000 mg | Freq: Four times a day (QID) | RECTAL | Status: DC | PRN

## 2017-03-19 MED ORDER — FUROSEMIDE 40 MG PO TABS
80.0000 mg | ORAL_TABLET | Freq: Every day | ORAL | Status: DC
  Administered 2017-03-20: 80 mg via ORAL
  Filled 2017-03-19: qty 2

## 2017-03-19 MED ORDER — PROPOFOL 10 MG/ML IV BOLUS
INTRAVENOUS | Status: DC | PRN
Start: 2017-03-19 — End: 2017-03-19
  Administered 2017-03-19: 150 mg via INTRAVENOUS

## 2017-03-19 MED ORDER — ACETAMINOPHEN 10 MG/ML IV SOLN
1000.0000 mg | INTRAVENOUS | Status: AC
  Administered 2017-03-19: 1000 mg via INTRAVENOUS
  Filled 2017-03-19: qty 100

## 2017-03-19 MED ORDER — FENTANYL CITRATE (PF) 100 MCG/2ML IJ SOLN: 25.0000 ug | INTRAMUSCULAR | Status: DC | PRN

## 2017-03-19 MED ORDER — CALCITRIOL 0.25 MCG PO CAPS
0.2500 ug | ORAL_CAPSULE | ORAL | Status: DC
  Administered 2017-03-20: 0.25 ug via ORAL
  Filled 2017-03-19: qty 1

## 2017-03-19 MED ORDER — SUCCINYLCHOLINE CHLORIDE 20 MG/ML IJ SOLN
INTRAMUSCULAR | Status: DC | PRN
  Administered 2017-03-19: 140 mg via INTRAVENOUS

## 2017-03-19 MED ORDER — PROPOFOL 500 MG/50ML IV EMUL: INTRAVENOUS | Status: DC | PRN

## 2017-03-19 MED ORDER — OXYCODONE HCL 5 MG PO TABS
5.0000 mg | ORAL_TABLET | ORAL | Status: DC | PRN
  Administered 2017-03-20: 10 mg via ORAL
  Administered 2017-03-20: 5 mg via ORAL
  Administered 2017-03-20 (×2): 10 mg via ORAL
  Filled 2017-03-19: qty 1
  Filled 2017-03-19 (×3): qty 2

## 2017-03-19 MED ORDER — DIPHENHYDRAMINE HCL 12.5 MG/5ML PO ELIX: 12.5000 mg | ORAL_SOLUTION | ORAL | Status: DC | PRN

## 2017-03-19 MED ORDER — BUPIVACAINE-EPINEPHRINE 0.25% -1:200000 IJ SOLN
INTRAMUSCULAR | Status: DC | PRN
  Administered 2017-03-19: 30 mL

## 2017-03-19 MED ORDER — ONDANSETRON HCL 4 MG/2ML IJ SOLN
INTRAMUSCULAR | Status: AC
  Filled 2017-03-19: qty 2

## 2017-03-19 MED ORDER — DEXAMETHASONE SODIUM PHOSPHATE 10 MG/ML IJ SOLN
10.0000 mg | Freq: Once | INTRAMUSCULAR | Status: AC
  Administered 2017-03-20: 10 mg via INTRAVENOUS
  Filled 2017-03-19: qty 1

## 2017-03-19 MED ORDER — LACTATED RINGERS IV SOLN
INTRAVENOUS | Status: DC
Start: 2017-03-19 — End: 2017-03-19
  Administered 2017-03-19: 15:00:00 via INTRAVENOUS

## 2017-03-19 MED ORDER — HYDROMORPHONE HCL-NACL 0.5-0.9 MG/ML-% IV SOSY: 0.5000 mg | PREFILLED_SYRINGE | INTRAVENOUS | Status: DC | PRN

## 2017-03-19 MED ORDER — DEXAMETHASONE SODIUM PHOSPHATE 10 MG/ML IJ SOLN
INTRAMUSCULAR | Status: AC
  Filled 2017-03-19: qty 1

## 2017-03-19 MED ORDER — ASPIRIN 81 MG PO CHEW
81.0000 mg | CHEWABLE_TABLET | Freq: Two times a day (BID) | ORAL | Status: DC
  Administered 2017-03-19 – 2017-03-20 (×2): 81 mg via ORAL
  Filled 2017-03-19 (×2): qty 1

## 2017-03-19 MED ORDER — ADULT MULTIVITAMIN W/MINERALS CH
ORAL_TABLET | Freq: Every day | ORAL | Status: DC
  Administered 2017-03-20: 1 via ORAL
  Filled 2017-03-19 (×2): qty 1

## 2017-03-19 MED ORDER — ONDANSETRON HCL 4 MG/2ML IJ SOLN
INTRAMUSCULAR | Status: DC | PRN
  Administered 2017-03-19: 4 mg via INTRAVENOUS

## 2017-03-19 MED ORDER — VITAMIN C 500 MG PO TABS
1000.0000 mg | ORAL_TABLET | Freq: Every day | ORAL | Status: DC
  Administered 2017-03-20: 1000 mg via ORAL
  Filled 2017-03-19: qty 2

## 2017-03-19 MED ORDER — MENTHOL 3 MG MT LOZG: 1.0000 | LOZENGE | OROMUCOSAL | Status: DC | PRN

## 2017-03-19 MED ORDER — METOCLOPRAMIDE HCL 5 MG PO TABS: 5.0000 mg | ORAL_TABLET | Freq: Three times a day (TID) | ORAL | Status: DC | PRN

## 2017-03-19 MED ORDER — ROCURONIUM BROMIDE 100 MG/10ML IV SOLN
INTRAVENOUS | Status: DC | PRN
  Administered 2017-03-19: 50 mg via INTRAVENOUS

## 2017-03-19 MED ORDER — ACETAMINOPHEN 325 MG PO TABS: 650.0000 mg | ORAL_TABLET | Freq: Four times a day (QID) | ORAL | Status: DC | PRN

## 2017-03-19 MED ORDER — FENTANYL CITRATE (PF) 100 MCG/2ML IJ SOLN
25.0000 ug | INTRAMUSCULAR | Status: DC | PRN
  Administered 2017-03-19 (×2): 50 ug via INTRAVENOUS

## 2017-03-19 SURGICAL SUPPLY — 43 items
ADH SKN CLS APL DERMABOND .7 (GAUZE/BANDAGES/DRESSINGS) ×1
BAG SPEC THK2 15X12 ZIP CLS (MISCELLANEOUS)
BAG ZIPLOCK 12X15 (MISCELLANEOUS) IMPLANT
CAPT HIP TOTAL 2 ×2 IMPLANT
CHLORAPREP W/TINT 26ML (MISCELLANEOUS) ×2 IMPLANT
CLOTH BEACON ORANGE TIMEOUT ST (SAFETY) ×2 IMPLANT
COVER PERINEAL POST (MISCELLANEOUS) ×2 IMPLANT
COVER SURGICAL LIGHT HANDLE (MISCELLANEOUS) ×2 IMPLANT
DECANTER SPIKE VIAL GLASS SM (MISCELLANEOUS) ×2 IMPLANT
DERMABOND ADVANCED (GAUZE/BANDAGES/DRESSINGS) ×1
DERMABOND ADVANCED .7 DNX12 (GAUZE/BANDAGES/DRESSINGS) ×1 IMPLANT
DRAPE SHEET LG 3/4 BI-LAMINATE (DRAPES) ×6 IMPLANT
DRAPE STERI IOBAN 125X83 (DRAPES) ×2 IMPLANT
DRAPE U-SHAPE 47X51 STRL (DRAPES) ×4 IMPLANT
DRSG AQUACEL AG ADV 3.5X10 (GAUZE/BANDAGES/DRESSINGS) ×2 IMPLANT
ELECT PENCIL ROCKER SW 15FT (MISCELLANEOUS) ×2 IMPLANT
ELECT REM PT RETURN 15FT ADLT (MISCELLANEOUS) ×2 IMPLANT
GAUZE SPONGE 4X4 12PLY STRL (GAUZE/BANDAGES/DRESSINGS) ×2 IMPLANT
GLOVE BIO SURGEON STRL SZ8.5 (GLOVE) ×4 IMPLANT
GLOVE BIOGEL PI IND STRL 8.5 (GLOVE) ×1 IMPLANT
GLOVE BIOGEL PI INDICATOR 8.5 (GLOVE) ×1
GOWN SPEC L3 XXLG W/TWL (GOWN DISPOSABLE) ×2 IMPLANT
HANDPIECE INTERPULSE COAX TIP (DISPOSABLE) ×2
HOLDER FOLEY CATH W/STRAP (MISCELLANEOUS) ×2 IMPLANT
HOOD PEEL AWAY FLYTE STAYCOOL (MISCELLANEOUS) ×4 IMPLANT
MARKER SKIN DUAL TIP RULER LAB (MISCELLANEOUS) ×2 IMPLANT
NEEDLE SPNL 18GX3.5 QUINCKE PK (NEEDLE) ×2 IMPLANT
PACK ANTERIOR HIP CUSTOM (KITS) ×2 IMPLANT
SAW OSC TIP CART 19.5X105X1.3 (SAW) ×2 IMPLANT
SEALER BIPOLAR AQUA 6.0 (INSTRUMENTS) ×2 IMPLANT
SET HNDPC FAN SPRY TIP SCT (DISPOSABLE) ×1 IMPLANT
SUT ETHIBOND NAB CT1 #1 30IN (SUTURE) ×4 IMPLANT
SUT MNCRL AB 3-0 PS2 18 (SUTURE) ×2 IMPLANT
SUT MON AB 2-0 CT1 36 (SUTURE) ×4 IMPLANT
SUT STRATAFIX PDO 1 14 VIOLET (SUTURE) ×2
SUT STRATFX PDO 1 14 VIOLET (SUTURE) ×1
SUT VIC AB 2-0 CT1 27 (SUTURE) ×2
SUT VIC AB 2-0 CT1 TAPERPNT 27 (SUTURE) ×1 IMPLANT
SUTURE STRATFX PDO 1 14 VIOLET (SUTURE) ×1 IMPLANT
SYR 50ML LL SCALE MARK (SYRINGE) ×2 IMPLANT
TRAY FOLEY W/METER SILVER 16FR (SET/KITS/TRAYS/PACK) ×2 IMPLANT
WATER STERILE IRR 1000ML POUR (IV SOLUTION) ×4 IMPLANT
YANKAUER SUCT BULB TIP 10FT TU (MISCELLANEOUS) ×2 IMPLANT

## 2017-03-19 NOTE — Interval H&P Note (Signed)
History and Physical Interval Note:  03/19/2017 5:40 PM  Marvin Garcia  has presented today for surgery, with the diagnosis of Degenerative joint disease, left hip  The various methods of treatment have been discussed with the patient and family. After consideration of risks, benefits and other options for treatment, the patient has consented to  Procedure(s) with comments: LEFT TOTAL HIP ARTHROPLASTY ANTERIOR APPROACH (Left) - Needs RNFA as a surgical intervention .  The patient's history has been reviewed, patient examined, no change in status, stable for surgery.  I have reviewed the patient's chart and labs.  Questions were answered to the patient's satisfaction.     Katye Valek, Horald Pollen

## 2017-03-19 NOTE — Anesthesia Procedure Notes (Signed)
Procedure Name: Intubation Date/Time: 03/19/2017 6:01 PM Performed by: Lissa Morales Pre-anesthesia Checklist: Patient identified, Emergency Drugs available, Suction available and Patient being monitored Patient Re-evaluated:Patient Re-evaluated prior to induction Oxygen Delivery Method: Circle system utilized Preoxygenation: Pre-oxygenation with 100% oxygen Induction Type: IV induction Ventilation: Mask ventilation without difficulty Laryngoscope Size: Mac and 4 Grade View: Grade II Tube type: Oral Tube size: 7.5 mm Number of attempts: 1 Airway Equipment and Method: Stylet and Oral airway Placement Confirmation: ETT inserted through vocal cords under direct vision,  positive ETCO2 and breath sounds checked- equal and bilateral Secured at: 22 cm Tube secured with: Tape Dental Injury: Teeth and Oropharynx as per pre-operative assessment

## 2017-03-19 NOTE — Discharge Instructions (Signed)
°Dr. Ashiah Karpowicz °Joint Replacement Specialist °[facility] °[address] °[place], [place] [address] °[phone] ° ° °TOTAL HIP REPLACEMENT POSTOPERATIVE DIRECTIONS ° ° ° °Hip Rehabilitation, Guidelines Following Surgery  ° °WEIGHT BEARING °Weight bearing as tolerated with assist device (walker, cane, etc) as directed, use it as long as suggested by your surgeon or therapist, typically at least 4-6 weeks. ° °The results of a hip operation are greatly improved after range of motion and muscle strengthening exercises. Follow all safety measures which are given to protect your hip. If any of these exercises cause increased pain or swelling in your joint, decrease the amount until you are comfortable again. Then slowly increase the exercises. Call your caregiver if you have problems or questions.  ° °HOME CARE INSTRUCTIONS  °Most of the following instructions are designed to prevent the dislocation of your new hip.  °Remove items at home which could result in a fall. This includes throw rugs or furniture in walking pathways.  °Continue medications as instructed at time of discharge. °· You may have some home medications which will be placed on hold until you complete the course of blood thinner medication. °· You may start showering once you are discharged home. Do not remove your dressing. °Do not put on socks or shoes without following the instructions of your caregivers.   °Sit on chairs with arms. Use the chair arms to help push yourself up when arising.  °Arrange for the use of a toilet seat elevator so you are not sitting low.  °· Walk with walker as instructed.  °You may resume a sexual relationship in one month or when given the OK by your caregiver.  °Use walker as long as suggested by your caregivers.  °You may put full weight on your legs and walk as much as is comfortable. °Avoid periods of inactivity such as sitting longer than an hour when not asleep. This helps prevent  blood clots.  °You may return to work once you are cleared by your surgeon.  °Do not drive a car for 6 weeks or until released by your surgeon.  °Do not drive while taking narcotics.  °Wear elastic stockings for two weeks following surgery during the day but you may remove then at night.  °Make sure you keep all of your appointments after your operation with all of your doctors and caregivers. You should call the office at the above phone number and make an appointment for approximately two weeks after the date of your surgery. °Please pick up a stool softener and laxative for home use as long as you are requiring pain medications. °· ICE to the affected hip every three hours for 30 minutes at a time and then as needed for pain and swelling. Continue to use ice on the hip for pain and swelling from surgery. You may notice swelling that will progress down to the foot and ankle.  This is normal after surgery.  Elevate the leg when you are not up walking on it.   °It is important for you to complete the blood thinner medication as prescribed by your doctor. °· Continue to use the breathing machine which will help keep your temperature down.  It is common for your temperature to cycle up and down following surgery, especially at night when you are not up moving around and exerting yourself.  The breathing machine keeps your lungs expanded and your temperature down. ° °RANGE OF MOTION AND STRENGTHENING EXERCISES  °These exercises are   designed to help you keep full movement of your hip joint. Follow your caregiver's or physical therapist's instructions. Perform all exercises about fifteen times, three times per day or as directed. Exercise both hips, even if you have had only one joint replacement. These exercises can be done on a training (exercise) mat, on the floor, on a table or on a bed. Use whatever works the best and is most comfortable for you. Use music or television while you are exercising so that the exercises  are a pleasant break in your day. This will make your life better with the exercises acting as a break in routine you can look forward to.  °Lying on your back, slowly slide your foot toward your buttocks, raising your knee up off the floor. Then slowly slide your foot back down until your leg is straight again.  °Lying on your back spread your legs as far apart as you can without causing discomfort.  °Lying on your side, raise your upper leg and foot straight up from the floor as far as is comfortable. Slowly lower the leg and repeat.  °Lying on your back, tighten up the muscle in the front of your thigh (quadriceps muscles). You can do this by keeping your leg straight and trying to raise your heel off the floor. This helps strengthen the largest muscle supporting your knee.  °Lying on your back, tighten up the muscles of your buttocks both with the legs straight and with the knee bent at a comfortable angle while keeping your heel on the floor.  ° °SKILLED REHAB INSTRUCTIONS: °If the patient is transferred to a skilled rehab facility following release from the hospital, a list of the current medications will be sent to the facility for the patient to continue.  When discharged from the skilled rehab facility, please have the facility set up the patient's Home Health Physical Therapy prior to being released. Also, the skilled facility will be responsible for providing the patient with their medications at time of release from the facility to include their pain medication and their blood thinner medication. If the patient is still at the rehab facility at time of the two week follow up appointment, the skilled rehab facility will also need to assist the patient in arranging follow up appointment in our office and any transportation needs. ° °MAKE SURE YOU:  °Understand these instructions.  °Will watch your condition.  °Will get help right away if you are not doing well or get worse. ° °Pick up stool softner and  laxative for home use following surgery while on pain medications. °Do not remove your dressing. °The dressing is waterproof--it is OK to take showers. °Continue to use ice for pain and swelling after surgery. °Do not use any lotions or creams on the incision until instructed by your surgeon. °Total Hip Protocol. ° ° °

## 2017-03-19 NOTE — Transfer of Care (Signed)
Immediate Anesthesia Transfer of Care Note  Patient: Marvin Garcia  Procedure(s) Performed: Procedure(s) with comments: LEFT TOTAL HIP ARTHROPLASTY ANTERIOR APPROACH (Left) - Needs RNFA  Patient Location: PACU  Anesthesia Type:General  Level of Consciousness:  sedated, patient cooperative and responds to stimulation  Airway & Oxygen Therapy:Patient Spontanous Breathing and Patient connected to face mask oxgen  Post-op Assessment:  Report given to PACU RN and Post -op Vital signs reviewed and stable  Post vital signs:  Reviewed and stable  Last Vitals:  Vitals:   03/19/17 1358  BP: (!) 172/102  Pulse: 63  Resp: 16  Temp: 37.6 C  SpO2: 656%    Complications: No apparent anesthesia complications

## 2017-03-19 NOTE — Anesthesia Preprocedure Evaluation (Addendum)
Anesthesia Evaluation  Patient identified by MRN, date of birth, ID band  Reviewed: Allergy & Precautions, NPO status , Unable to perform ROS - Chart review only  Airway Mallampati: II  TM Distance: >3 FB     Dental   Pulmonary    breath sounds clear to auscultation       Cardiovascular hypertension,  Rhythm:Regular Rate:Normal     Neuro/Psych  Headaches,    GI/Hepatic negative GI ROS, Neg liver ROS,   Endo/Other    Renal/GU Renal disease     Musculoskeletal  (+) Arthritis ,   Abdominal   Peds  Hematology   Anesthesia Other Findings   Reproductive/Obstetrics                            Anesthesia Physical Anesthesia Plan  ASA: III  Anesthesia Plan: General   Post-op Pain Management:    Induction: Intravenous  PONV Risk Score and Plan: 2 and Ondansetron, Dexamethasone, Propofol infusion and Treatment may vary due to age or medical condition  Airway Management Planned:   Additional Equipment:   Intra-op Plan:   Post-operative Plan:   Informed Consent: I have reviewed the patients History and Physical, chart, labs and discussed the procedure including the risks, benefits and alternatives for the proposed anesthesia with the patient or authorized representative who has indicated his/her understanding and acceptance.   Dental advisory given  Plan Discussed with: CRNA, Anesthesiologist and Surgeon  Anesthesia Plan Comments:        Anesthesia Quick Evaluation

## 2017-03-19 NOTE — Op Note (Signed)
OPERATIVE REPORT  SURGEON: Rod Can, MD   ASSISTANT: Staff.  PREOPERATIVE DIAGNOSIS: Left hip arthritis.   POSTOPERATIVE DIAGNOSIS: Left hip arthritis.   PROCEDURE: Left total hip arthroplasty, anterior approach.   IMPLANTS: DePuy Tri Lock stem, size 5, hi offset. DePuy Pinnacle Cup, size 54 mm. DePuy Altrx liner, size 36 by 54 mm, +4 neutral. DePuy Biolox ceramic head ball, size 36 + 8.5 mm.  ANESTHESIA:  General  ESTIMATED BLOOD LOSS: 500 mL.    ANTIBIOTICS: 2 g Ancef.  DRAINS: None.  COMPLICATIONS: None.   CONDITION: PACU - hemodynamically stable.   BRIEF CLINICAL NOTE: Marvin Garcia is a 61 y.o. male with a long-standing history of Left hip arthritis. After failing conservative management, the patient was indicated for total hip arthroplasty. The risks, benefits, and alternatives to the procedure were explained, and the patient elected to proceed.  PROCEDURE IN DETAIL: Surgical site was marked by myself in the pre-op holding area. Once inside the operating room, spinal anesthesia was obtained, and a foley catheter was inserted. The patient was then positioned on the Hana table. All bony prominences were well padded. The hip was prepped and draped in the normal sterile surgical fashion. A time-out was called verifying side and site of surgery. The patient received IV antibiotics within 60 minutes of beginning the procedure.  The direct anterior approach to the hip was performed through the Hueter interval. Lateral femoral circumflex vessels were treated with the Auqumantys. The anterior capsule was exposed and an inverted T capsulotomy was made.The femoral neck cut was made to the level of the templated cut. A corkscrew was placed into the head and the head was removed. The femoral head was found to have eburnated bone. The head was passed to the back table and was measured.  Acetabular exposure was achieved, and the pulvinar and labrum were excised.  Sequential reaming of the acetabulum was then performed up to a size 53 mm reamer. A 54 mm cup was then opened and impacted into place at approximately 40 degrees of abduction and 20 degrees of anteversion. The final polyethylene liner was impacted into place and acetabular osteophytes were removed.   I then gained femoral exposure taking care to protect the abductors and greater trochanter. This was performed using standard external rotation, extension, and adduction. The capsule was peeled off the inner aspect of the greater trochanter, taking care to preserve the short external rotators. A cookie cutter was used to enter the femoral canal, and then the femoral canal finder was placed. Sequential broaching was performed up to a size 5. Calcar planer was used on the femoral neck remnant. I placed a hi offset neck and a trial head ball. The hip was reduced. Leg lengths and offset were checked fluoroscopically. The hip was dislocated and trial components were removed. The final implants were placed, and the hip was reduced.  Fluoroscopy was used to confirm component position and leg lengths. At 90 degrees of external rotation and full extension, the hip was stable to an anterior directed force.  The wound was copiously irrigated with normal saline using pulse lavage. Marcaine solution was injected into the periarticular soft tissue. The wound was closed in layers using #1 Vicryl and Stratafix for the fascia, 2-0 Vicryl for the subcutaneous fat, 2-0 Monocryl for the deep dermal layer, 3-0 running Monocryl subcuticular stitch, and Dermabond for the skin. Once the glue was fully dried, an Aquacell Ag dressing was applied. The patient was transported to the recovery room in  stable condition. Sponge, needle, and instrument counts were correct at the end of the case x2. The patient tolerated the procedure well and there were no known complications.

## 2017-03-19 NOTE — H&P (View-Only) (Signed)
TOTAL HIP ADMISSION H&P  Patient is admitted for left total hip arthroplasty.  Subjective:  Chief Complaint: left hip pain  HPI: Marvin Garcia, 61 y.o. male, has a history of pain and functional disability in the left hip(s) due to arthritis and patient has failed non-surgical conservative treatments for greater than 12 weeks to include NSAID's and/or analgesics, corticosteriod injections, supervised PT with diminished ADL's post treatment, use of assistive devices, weight reduction as appropriate and activity modification.  Onset of symptoms was gradual starting 1 years ago with gradually worsening course since that time.The patient noted no past surgery on the left hip(s).  Patient currently rates pain in the left hip at 10 out of 10 with activity. Patient has night pain, worsening of pain with activity and weight bearing, trendelenberg gait, pain that interfers with activities of daily living, pain with passive range of motion and crepitus. Patient has evidence of subchondral cysts, subchondral sclerosis, periarticular osteophytes and joint space narrowing by imaging studies. This condition presents safety issues increasing the risk of falls.  There is no current active infection.  There are no active problems to display for this patient.  Past Medical History:  Diagnosis Date  . Arrhythmia 1991   patient  describes that he woke up surrounded by doctors saying that his "heart had stopped while sleeping"; denies having pain during this event; also denies any reccurrent issues after that episode; says that was "when they told me i had high blood pressure"   . Arthritis   . Headache   . Hypertension   . Umbilical hernia     Past Surgical History:  Procedure Laterality Date  . HERNIA REPAIR     inguinal   . INSERTION OF MESH N/A 01/21/2017   Procedure: INSERTION OF MESH;  Surgeon: Clovis Riley, MD;  Location: [facility];  Service: General;  Laterality: N/A;  . JOINT REPLACEMENT    .  KNEE ARTHROSCOPY    . TOTAL KNEE ARTHROPLASTY  06/04/2012   Procedure: TOTAL KNEE ARTHROPLASTY;  Surgeon: Sydnee Cabal, MD;  Location: [facility];  Service: Orthopedics;  Laterality: Left;  . UMBILICAL HERNIA REPAIR N/A 01/21/2017   Procedure: UMBILICAL HERNIA REPAIR WITH MESH;  Surgeon: Clovis Riley, MD;  Location: [facility];  Service: General;  Laterality: N/A;     (Not in a hospital admission) Allergies  Allergen Reactions  . Hydrocodone Other (See Comments)    Severe hiccups    Social History  Substance Use Topics  . Smoking status: Never Smoker  . Smokeless tobacco: Never Used  . Alcohol use No    Family History  Problem Relation Age of Onset  . Diabetes Mother   . Heart disease Father   . Asthma Son   . Diabetes Maternal Grandmother      Review of Systems  Constitutional: Negative.   HENT: Negative.   Eyes: Negative.   Cardiovascular: Negative.   Gastrointestinal: Negative.   Genitourinary: Negative.   Musculoskeletal: Positive for joint pain.  Skin: Negative.   Neurological: Positive for headaches.  Endo/Heme/Allergies: Negative.   Psychiatric/Behavioral: Negative.     Objective:  Physical Exam  Constitutional: He is oriented to person, place, and time. He appears well-developed and well-nourished.  HENT:  Head: Normocephalic and atraumatic.  Eyes: Pupils are equal, round, and reactive to light. Conjunctivae and EOM are normal.  Neck: Normal range of motion. Neck supple.  Cardiovascular: Normal rate, regular rhythm and intact distal pulses.   Respiratory: Effort normal. No respiratory distress.  GI: Soft. He exhibits no distension.  Genitourinary:  Genitourinary Comments: deferred  Musculoskeletal:       Left hip: He exhibits decreased range of motion and decreased strength.  Neurological: He is alert and oriented to person, place, and time. He has normal reflexes.  Skin: Skin is warm and dry.  Psychiatric: He has a normal mood and affect. His behavior  is normal. Judgment and thought content normal.    Vital signs in last 24 hours: @VSRANGES @  Labs:   Estimated body mass index is 39 kg/m as calculated from the following:   Height as of 01/21/17: 5\' 7"  (1.702 m).   Weight as of 01/21/17: 112.9 kg (249 lb).   Imaging Review Plain radiographs demonstrate severe degenerative joint disease of the left hip(s). The bone quality appears to be adequate for age and reported activity level.  Assessment/Plan:  End stage arthritis, left hip(s)  The patient history, physical examination, clinical judgement of the provider and imaging studies are consistent with end stage degenerative joint disease of the left hip(s) and total hip arthroplasty is deemed medically necessary. The treatment options including medical management, injection therapy, arthroscopy and arthroplasty were discussed at length. The risks and benefits of total hip arthroplasty were presented and reviewed. The risks due to aseptic loosening, infection, stiffness, dislocation/subluxation,  thromboembolic complications and other imponderables were discussed.  The patient acknowledged the explanation, agreed to proceed with the plan and consent was signed. Patient is being admitted for inpatient treatment for surgery, pain control, PT, OT, prophylactic antibiotics, VTE prophylaxis, progressive ambulation and ADL's and discharge planning.The patient is planning to be discharged home with HEP. CKD.

## 2017-03-20 ENCOUNTER — Encounter (HOSPITAL_COMMUNITY): Payer: Self-pay | Admitting: Orthopedic Surgery

## 2017-03-20 LAB — BASIC METABOLIC PANEL
ANION GAP: 9 (ref 5–15)
BUN: 38 mg/dL — ABNORMAL HIGH (ref 6–20)
CO2: 24 mmol/L (ref 22–32)
Calcium: 8.8 mg/dL — ABNORMAL LOW (ref 8.9–10.3)
Chloride: 107 mmol/L (ref 101–111)
Creatinine, Ser: 2.77 mg/dL — ABNORMAL HIGH (ref 0.61–1.24)
GFR calc Af Amer: 27 mL/min — ABNORMAL LOW (ref >60)
GFR, EST NON AFRICAN AMERICAN: 23 mL/min — AB (ref >60)
GLUCOSE: 139 mg/dL — AB (ref 65–99)
POTASSIUM: 3.8 mmol/L (ref 3.5–5.1)
SODIUM: 140 mmol/L (ref 135–145)

## 2017-03-20 LAB — CBC
HCT: 30.3 % — ABNORMAL LOW (ref 39.0–52.0)
Hemoglobin: 10.5 g/dL — ABNORMAL LOW (ref 13.0–17.0)
MCH: 24.9 pg — ABNORMAL LOW (ref 26.0–34.0)
MCHC: 34.7 g/dL (ref 30.0–36.0)
MCV: 71.8 fL — AB (ref 78.0–100.0)
PLATELETS: 302 10*3/uL (ref 150–400)
RBC: 4.22 MIL/uL (ref 4.22–5.81)
RDW: 14.9 % (ref 11.5–15.5)
WBC: 13.4 10*3/uL — AB (ref 4.0–10.5)

## 2017-03-20 MED ORDER — OXYCODONE-ACETAMINOPHEN 5-325 MG PO TABS: 1.0000 | ORAL_TABLET | ORAL | 0 refills | Status: DC | PRN

## 2017-03-20 MED ORDER — SODIUM CHLORIDE 0.9 % IV SOLN
25.0000 mg | Freq: Once | INTRAVENOUS | Status: AC
  Administered 2017-03-20: 25 mg via INTRAVENOUS
  Filled 2017-03-20: qty 1

## 2017-03-20 MED ORDER — METHOCARBAMOL 500 MG PO TABS: 500.0000 mg | ORAL_TABLET | Freq: Four times a day (QID) | ORAL | 0 refills | Status: DC | PRN

## 2017-03-20 MED ORDER — ONDANSETRON HCL 4 MG PO TABS: 4.0000 mg | ORAL_TABLET | Freq: Four times a day (QID) | ORAL | 0 refills | Status: DC | PRN

## 2017-03-20 MED ORDER — DOCUSATE SODIUM 100 MG PO CAPS: 100.0000 mg | ORAL_CAPSULE | Freq: Two times a day (BID) | ORAL | 1 refills | Status: DC

## 2017-03-20 MED ORDER — SENNA 8.6 MG PO TABS: 2.0000 | ORAL_TABLET | Freq: Every day | ORAL | 0 refills | Status: DC

## 2017-03-20 MED ORDER — ASPIRIN 81 MG PO CHEW
81.0000 mg | CHEWABLE_TABLET | Freq: Two times a day (BID) | ORAL | 1 refills | Status: DC
Start: 2017-03-20 — End: 2024-05-11

## 2017-03-20 NOTE — Discharge Summary (Signed)
Physician Discharge Summary  Patient ID: Marvin Garcia MRN: [id number] DOB/AGE: September 25, 1955 62 y.o.  Admit date: 03/19/2017 Discharge date: 03/20/2017  Admission Diagnoses:  Osteoarthritis of left hip  Discharge Diagnoses:  Principal Problem:   Osteoarthritis of left hip   Past Medical History:  Diagnosis Date  . Arrhythmia 1991   patient  describes that he woke up surrounded by doctors saying that his "heart had stopped while sleeping"; denies having pain during this event; also denies any reccurrent issues after that episode; says that was "when they told me i had high blood pressure"   . Arthritis   . Chronic kidney disease    stage IV managed by Dr Margo Aye kidney   . Headache   . Hypertension   . Umbilical hernia     Surgeries: Procedure(s): LEFT TOTAL HIP ARTHROPLASTY ANTERIOR APPROACH on 03/19/2017   Consultants (if any):   Discharged Condition: Improved  Hospital Course: Marvin Garcia is an 61 y.o. male who was admitted 03/19/2017 with a diagnosis of Osteoarthritis of left hip and went to the operating room on 03/19/2017 and underwent the above named procedures.    He was given perioperative antibiotics:  Anti-infectives    Start     Dose/Rate Route Frequency Ordered Stop   03/19/17 2359  ceFAZolin (ANCEF) IVPB 2g/100 mL premix     2 g 200 mL/hr over 30 Minutes Intravenous Every 6 hours 03/19/17 2146 03/20/17 0605   03/19/17 1415  ceFAZolin (ANCEF) IVPB 2g/100 mL premix     2 g 200 mL/hr over 30 Minutes Intravenous On call to O.R. 03/19/17 1357 03/19/17 1753    .  He was given sequential compression devices, early ambulation, and ASA for DVT prophylaxis.  He benefited maximally from the hospital stay and there were no complications.    Recent vital signs:  Vitals:   03/20/17 0057 03/20/17 0530  BP: (!) 156/94 (!) 144/83  Pulse: 63 66  Resp: 16 15  Temp: 98.6 F (37 C) 98.4 F (36.9 C)  SpO2: 96% 98%    Recent laboratory studies:  Lab  Results  Component Value Date   HGB 10.5 (L) 03/20/2017   HGB 12.3 (L) 03/19/2017   HGB 12.4 (L) 03/10/2017   Lab Results  Component Value Date   WBC 13.4 (H) 03/20/2017   PLT 302 03/20/2017   Lab Results  Component Value Date   INR 0.91 05/31/2012   Lab Results  Component Value Date   NA 140 03/20/2017   K 3.8 03/20/2017   CL 107 03/20/2017   CO2 24 03/20/2017   BUN 38 (H) 03/20/2017   CREATININE 2.77 (H) 03/20/2017   GLUCOSE 139 (H) 03/20/2017    Discharge Medications:   Allergies as of 03/20/2017      Reactions   Hydrocodone Other (See Comments)   Severe hiccups      Medication List    STOP taking these medications   acetaminophen 500 MG tablet Commonly known as:  TYLENOL   aspirin EC 81 MG tablet Replaced by:  aspirin 81 MG chewable tablet     TAKE these medications   aspirin 81 MG chewable tablet Chew 1 tablet (81 mg total) by mouth 2 (two) times daily. Replaces:  aspirin EC 81 MG tablet   atenolol 100 MG tablet Commonly known as:  TENORMIN Take 100 mg by mouth daily. EVERY AM   atorvastatin 20 MG tablet Commonly known as:  LIPITOR Take 20 mg by mouth every evening.   calcitRIOL  0.25 MCG capsule Commonly known as:  ROCALTROL Take 0.25 mcg by mouth every other day.   CENTRUM SILVER 50+MEN PO Take 1 tablet by mouth daily.   cloNIDine 0.2 MG tablet Commonly known as:  CATAPRES Take 0.2 mg by mouth 3 (three) times daily.   CoQ10 100 MG Caps Take 1 capsule by mouth daily.   docusate sodium 100 MG capsule Commonly known as:  COLACE Take 1 capsule (100 mg total) by mouth 2 (two) times daily.   febuxostat 40 MG tablet Commonly known as:  ULORIC Take 40 mg by mouth daily.   furosemide 40 MG tablet Commonly known as:  LASIX Take 40-80 mg by mouth See admin instructions. 80 mg every morning, and 40 mg every evening   hydrALAZINE 100 MG tablet Commonly known as:  APRESOLINE Take 100 mg by mouth 3 (three) times daily.   irbesartan 300 MG  tablet Commonly known as:  AVAPRO Take 300 mg by mouth daily.   Krill Oil 350 MG Caps Take 350 mg by mouth daily.   methocarbamol 500 MG tablet Commonly known as:  ROBAXIN Take 1 tablet (500 mg total) by mouth every 6 (six) hours as needed for muscle spasms.   ondansetron 4 MG tablet Commonly known as:  ZOFRAN Take 1 tablet (4 mg total) by mouth every 6 (six) hours as needed for nausea.   oxyCODONE-acetaminophen 5-325 MG tablet Commonly known as:  ROXICET Take 1-2 tablets by mouth every 4 (four) hours as needed for severe pain. What changed:  how much to take  when to take this   senna 8.6 MG Tabs tablet Commonly known as:  SENOKOT Take 2 tablets (17.2 mg total) by mouth at bedtime.   vitamin C 1000 MG tablet Take 1,000 mg by mouth daily.            Durable Medical Equipment        Start     Ordered   03/19/17 2147  DME Walker rolling  Once    Question:  Patient needs a walker to treat with the following condition  Answer:  Status post total replacement of left hip   03/19/17 2146   03/19/17 2147  DME 3 n 1  Once     03/19/17 2146      Diagnostic Studies: Dg Pelvis Portable  Result Date: 03/19/2017 CLINICAL DATA:  Status post left total hip arthroplasty EXAM: PORTABLE PELVIS 1-2 VIEWS COMPARISON:  None. FINDINGS: The patient has undergone left total hip arthroplasty. The acetabular and femoral components are normal in appearance. No immediate complication is evident. There is soft tissue gas of the left thigh. IMPRESSION: Expected postoperative appearance of left total hip arthroplasty. Electronically Signed   By: Ulyses Jarred M.D.   On: 03/19/2017 21:06   Dg C-arm 1-60 Min-no Report  Result Date: 03/19/2017 Fluoroscopy was utilized by the requesting physician.  No radiographic interpretation.    Disposition: 01-Home or Self Care  Discharge Instructions    Call MD / Call 911    Complete by:  As directed    If you experience chest pain or shortness of  breath, CALL 911 and be transported to the hospital emergency room.  If you develope a fever above 101 F, pus (white drainage) or increased drainage or redness at the wound, or calf pain, call your surgeon's office.   Constipation Prevention    Complete by:  As directed    Drink plenty of fluids.  Prune juice may be helpful.  You may use a stool softener, such as Colace (over the counter) 100 mg twice a day.  Use MiraLax (over the counter) for constipation as needed.   Diet - low sodium heart healthy    Complete by:  As directed    Driving restrictions    Complete by:  As directed    No driving for 6 weeks   Increase activity slowly as tolerated    Complete by:  As directed    Lifting restrictions    Complete by:  As directed    No lifting for 6 weeks   TED hose    Complete by:  As directed    Use stockings (TED hose) for 2 weeks on both leg(s).  You may remove them at night for sleeping.      Follow-up Information    Marrie Chandra, Aaron Edelman, MD. Schedule an appointment as soon as possible for a visit in 2 weeks.   Specialty:  Orthopedic Surgery Why:  For wound re-check Contact information: [address] [address] [phone]            Signed: Elie Goody 03/20/2017, 7:49 AM

## 2017-03-20 NOTE — Progress Notes (Signed)
   Subjective:  Patient reports pain as mild to moderate.  Denies N/V/CP/SOB.  Objective:   VITALS:   Vitals:   03/19/17 2339 03/20/17 0005 03/20/17 0057 03/20/17 0530  BP: (!) 187/97 (!) 175/96 (!) 156/94 (!) 144/83  Pulse: 64  63 66  Resp: 15  16 15   Temp: 98.7 F (37.1 C)  98.6 F (37 C) 98.4 F (36.9 C)  TempSrc: Oral  Oral Oral  SpO2: 97%  96% 98%  Weight:      Height:        NAD ABD soft Sensation intact distally Intact pulses distally Dorsiflexion/Plantar flexion intact Incision: dressing C/D/I Compartment soft   Lab Results  Component Value Date   WBC 13.4 (H) 03/20/2017   HGB 10.5 (L) 03/20/2017   HCT 30.3 (L) 03/20/2017   MCV 71.8 (L) 03/20/2017   PLT 302 03/20/2017   BMET    Component Value Date/Time   NA 143 03/19/2017 1635   K 3.3 (L) 03/19/2017 1635   CL 105 03/19/2017 1635   CO2 26 03/19/2017 1635   GLUCOSE 103 (H) 03/19/2017 1635   BUN 40 (H) 03/19/2017 1635   CREATININE 2.68 (H) 03/19/2017 1635   CALCIUM 9.8 03/19/2017 1635   GFRNONAA 24 (L) 03/19/2017 1635   GFRAA 28 (L) 03/19/2017 1635     Assessment/Plan: 1 Day Post-Op   Principal Problem:   Osteoarthritis of left hip   WBAT with walker DVT ppx: ASA, SCDs, TEDS PO pain control PT/OT Dispo: am labs pending, D/C home today if Cr stable    Marvin Garcia 03/20/2017, 7:44 AM   Rod Can, MD Cell [phone]

## 2017-03-20 NOTE — Evaluation (Signed)
Physical Therapy Evaluation Patient Details Name: Marvin Garcia MRN: [id number] DOB: 29-Mar-1956 Today's Date: 03/20/2017   History of Present Illness  L THA , direct anterior  Clinical Impression  The patient is progressing well. Plans DC today after PT. Pt admitted with above diagnosis. Pt currently with functional limitations due to the deficits listed below (see PT Problem List).  Pt will benefit from skilled PT to increase their independence and safety with mobility to allow discharge to the venue listed below.       Follow Up Recommendations DC plan and follow up therapy as arranged by surgeon    Equipment Recommendations  None recommended by PT    Recommendations for Other Services       Precautions / Restrictions Precautions Precautions: Fall      Mobility  Bed Mobility Overal bed mobility: Needs Assistance Bed Mobility: Supine to Sit     Supine to sit: Min guard     General bed mobility comments: used rails, cues for technique  Transfers Overall transfer level: Needs assistance Equipment used: Rolling walker (2 wheeled) Transfers: Sit to/from Stand Sit to Stand: Min guard         General transfer comment: cues for hjand and left leg position  Ambulation/Gait Ambulation/Gait assistance: Min guard Ambulation Distance (Feet): 300 Feet Assistive device: Rolling walker (2 wheeled) Gait Pattern/deviations: Step-to pattern;Step-through pattern     General Gait Details: cues for sequence  Stairs            Wheelchair Mobility    Modified Rankin (Stroke Patients Only)       Balance                                             Pertinent Vitals/Pain Pain Assessment: 0-10 Pain Score: 8  Pain Location: left  thigh Pain Descriptors / Indicators: Tightness Pain Intervention(s): Premedicated before session;Repositioned;Ice applied    Home Living Family/patient expects to be discharged to:: Private residence Living  Arrangements: Spouse/significant other Available Help at Discharge: Family Type of Home: House Home Access: Stairs to enter Entrance Stairs-Rails: None Entrance Stairs-Number of Steps: 1 Home Layout: One level Home Equipment: Environmental consultant - 2 wheels;Bedside commode      Prior Function Level of Independence: Independent with assistive device(s)               Hand Dominance        Extremity/Trunk Assessment   Upper Extremity Assessment Upper Extremity Assessment: Overall WFL for tasks assessed    Lower Extremity Assessment Lower Extremity Assessment: LLE deficits/detail LLE Deficits / Details: advances the leg    Cervical / Trunk Assessment Cervical / Trunk Assessment: Normal  Communication   Communication: No difficulties  Cognition Arousal/Alertness: Awake/alert Behavior During Therapy: WFL for tasks assessed/performed Overall Cognitive Status: Within Functional Limits for tasks assessed                                        General Comments      Exercises Total Joint Exercises Ankle Circles/Pumps: AROM;Both;10 reps Quad Sets: AROM;Both;10 reps Short Arc Quad: AROM;Left;10 reps Heel Slides: AAROM;Left;10 reps Hip ABduction/ADduction: AAROM;Left;10 reps Long Arc Quad: AROM;Left;10 reps   Assessment/Plan    PT Assessment Patient needs continued PT services  PT Problem List Decreased strength;Decreased range  of motion;Decreased activity tolerance;Decreased mobility;Decreased knowledge of precautions;Decreased safety awareness;Decreased knowledge of use of DME;Pain       PT Treatment Interventions DME instruction;Gait training;Stair training;Functional mobility training;Therapeutic activities;Patient/family education    PT Goals (Current goals can be found in the Care Plan section)  Acute Rehab PT Goals Patient Stated Goal: walk with no pain PT Goal Formulation: With patient Time For Goal Achievement: 03/21/17 Potential to Achieve Goals:  Good    Frequency 7X/week   Barriers to discharge        Co-evaluation               AM-PAC PT "6 Clicks" Daily Activity  Outcome Measure Difficulty turning over in bed (including adjusting bedclothes, sheets and blankets)?: Total Difficulty moving from lying on back to sitting on the side of the bed? : Total Difficulty sitting down on and standing up from a chair with arms (e.g., wheelchair, bedside commode, etc,.)?: Total Help needed moving to and from a bed to chair (including a wheelchair)?: A Little Help needed walking in hospital room?: A Little Help needed climbing 3-5 steps with a railing? : A Lot 6 Click Score: 11    End of Session   Activity Tolerance: Patient tolerated treatment well Patient left: in chair;with call bell/phone within reach Nurse Communication: Mobility status PT Visit Diagnosis: Difficulty in walking, not elsewhere classified (R26.2)    Time: 5859-2924 PT Time Calculation (min) (ACUTE ONLY): 30 min   Charges:   PT Evaluation $PT Eval Low Complexity: 1 Low PT Treatments $Gait Training: 8-22 mins   PT G CodesTresa Endo PT [phone]   Claretha Cooper 03/20/2017, 10:53 AM

## 2017-03-20 NOTE — Progress Notes (Signed)
Discharge planning, no HH needs identified. Plan for home with HEP, has DME. [phone]

## 2017-03-20 NOTE — Progress Notes (Signed)
Physical Therapy Treatment Patient Details Name: Marvin Garcia MRN: [id number] DOB: [birth year] Today's Date: 03/20/2017    History of Present Illness L THA , direct anterior    PT Comments    The patient is progressing well. Ready for DC./   Follow Up Recommendations  DC plan and follow up therapy as arranged by surgeon     Equipment Recommendations  None recommended by PT    Recommendations for Other Services       Precautions / Restrictions Precautions Precautions: Fall    Mobility  Bed Mobility   Bed Mobility: Supine to Sit;Sit to Supine     Supine to sit: Modified independent (Device/Increase time)     General bed mobility comments: used rails, cues for technique, used sheet to self assist leg onto bed  Transfers Overall transfer level: Needs assistance Equipment used: Rolling walker (2 wheeled) Transfers: Sit to/from Stand Sit to Stand: Supervision         General transfer comment: cues for jand and left leg position  Ambulation/Gait                 Stairs Stairs: Yes   Stair Management: No rails;Backwards;With walker Number of Stairs: 1 General stair comments: practiced x 2  Wheelchair Mobility    Modified Rankin (Stroke Patients Only)       Balance                                            Cognition Arousal/Alertness: Awake/alert                                            Exercises Total Joint Exercises Ankle Circles/Pumps: AROM;Both;10 reps Quad Sets: AROM;Both;10 reps Short Arc Quad: AROM;Left;10 reps;Supine Heel Slides: AAROM;Left;10 reps;Supine Hip ABduction/ADduction: AAROM;Left;10 reps;Supine Long Arc Quad: AROM;Left;10 reps;Supine Knee Flexion: AROM;Standing Marching in Standing: AROM;Standing Standing Hip Extension: AROM;Standing    General Comments        Pertinent Vitals/Pain Pain Score: 5  Pain Location: left  thigh Pain Descriptors / Indicators:  Tightness Pain Intervention(s): Premedicated before session;Ice applied    Home Living                      Prior Function            PT Goals (current goals can now be found in the care plan section) Progress towards PT goals: Progressing toward goals    Frequency    7X/week      PT Plan Current plan remains appropriate    Co-evaluation              AM-PAC PT "6 Clicks" Daily Activity  Outcome Measure  Difficulty turning over in bed (including adjusting bedclothes, sheets and blankets)?: A Little Difficulty moving from lying on back to sitting on the side of the bed? : A Little Difficulty sitting down on and standing up from a chair with arms (e.g., wheelchair, bedside commode, etc,.)?: A Little Help needed moving to and from a bed to chair (including a wheelchair)?: A Little Help needed walking in hospital room?: A Little Help needed climbing 3-5 steps with a railing? : A Little 6 Click Score: 18    End of Session   Activity Tolerance: Patient  tolerated treatment well Patient left: in chair;with call bell/phone within reach Nurse Communication: Mobility status PT Visit Diagnosis: Difficulty in walking, not elsewhere classified (R26.2)     Time: 4707-6151 PT Time Calculation (min) (ACUTE ONLY): 26 min  Charges:  $Gait Training: 8-22 mins $Therapeutic Exercise: 8-22 mins                    G Codes:          Claretha Cooper 03/20/2017, 4:28 PM

## 2017-03-30 NOTE — Anesthesia Postprocedure Evaluation (Signed)
Anesthesia Post Note  Patient: Marvin Garcia  Procedure(s) Performed: Procedure(s) (LRB): LEFT TOTAL HIP ARTHROPLASTY ANTERIOR APPROACH (Left)     Patient location during evaluation: PACU Anesthesia Type: General Level of consciousness: awake Pain management: pain level controlled Respiratory status: spontaneous breathing Cardiovascular status: stable Postop Assessment: no signs of nausea or vomiting Anesthetic complications: no    Last Vitals:  Vitals:   03/20/17 1005 03/20/17 1422  BP: 140/80 (!) 142/87  Pulse: 72 (!) 58  Resp: 16 17  Temp: 37.1 C 36.8 C  SpO2: 99% 99%    Last Pain:  Vitals:   03/20/17 1747  TempSrc:   PainSc: 6                  Janace Decker

## 2017-04-02 DIAGNOSIS — Z96642 Presence of left artificial hip joint: Secondary | ICD-10-CM | POA: Diagnosis not present

## 2017-04-02 DIAGNOSIS — Z471 Aftercare following joint replacement surgery: Secondary | ICD-10-CM | POA: Diagnosis not present

## 2017-04-29 DIAGNOSIS — Z96642 Presence of left artificial hip joint: Secondary | ICD-10-CM | POA: Diagnosis not present

## 2017-04-29 DIAGNOSIS — Z471 Aftercare following joint replacement surgery: Secondary | ICD-10-CM | POA: Diagnosis not present

## 2017-05-13 DIAGNOSIS — N184 Chronic kidney disease, stage 4 (severe): Secondary | ICD-10-CM | POA: Diagnosis not present

## 2017-05-13 DIAGNOSIS — D649 Anemia, unspecified: Secondary | ICD-10-CM | POA: Diagnosis not present

## 2017-05-13 DIAGNOSIS — I129 Hypertensive chronic kidney disease with stage 1 through stage 4 chronic kidney disease, or unspecified chronic kidney disease: Secondary | ICD-10-CM | POA: Diagnosis not present

## 2017-05-29 DIAGNOSIS — Z0000 Encounter for general adult medical examination without abnormal findings: Secondary | ICD-10-CM | POA: Diagnosis not present

## 2017-05-29 DIAGNOSIS — Z23 Encounter for immunization: Secondary | ICD-10-CM | POA: Diagnosis not present

## 2017-05-29 DIAGNOSIS — R972 Elevated prostate specific antigen [PSA]: Secondary | ICD-10-CM | POA: Diagnosis not present

## 2017-05-29 DIAGNOSIS — E782 Mixed hyperlipidemia: Secondary | ICD-10-CM | POA: Diagnosis not present

## 2017-07-13 DIAGNOSIS — M25552 Pain in left hip: Secondary | ICD-10-CM | POA: Diagnosis not present

## 2017-07-13 DIAGNOSIS — Z96642 Presence of left artificial hip joint: Secondary | ICD-10-CM | POA: Diagnosis not present

## 2017-07-13 DIAGNOSIS — Z471 Aftercare following joint replacement surgery: Secondary | ICD-10-CM | POA: Diagnosis not present

## 2017-07-28 DIAGNOSIS — M25552 Pain in left hip: Secondary | ICD-10-CM | POA: Diagnosis not present

## 2017-08-07 DIAGNOSIS — M25552 Pain in left hip: Secondary | ICD-10-CM | POA: Diagnosis not present

## 2017-08-13 DIAGNOSIS — M25552 Pain in left hip: Secondary | ICD-10-CM | POA: Insufficient documentation

## 2017-08-18 DIAGNOSIS — M25552 Pain in left hip: Secondary | ICD-10-CM | POA: Diagnosis not present

## 2017-08-20 DIAGNOSIS — M25552 Pain in left hip: Secondary | ICD-10-CM | POA: Diagnosis not present

## 2017-09-23 DIAGNOSIS — N2581 Secondary hyperparathyroidism of renal origin: Secondary | ICD-10-CM | POA: Diagnosis not present

## 2017-09-23 DIAGNOSIS — D649 Anemia, unspecified: Secondary | ICD-10-CM | POA: Diagnosis not present

## 2017-09-23 DIAGNOSIS — N184 Chronic kidney disease, stage 4 (severe): Secondary | ICD-10-CM | POA: Diagnosis not present

## 2017-09-23 DIAGNOSIS — I129 Hypertensive chronic kidney disease with stage 1 through stage 4 chronic kidney disease, or unspecified chronic kidney disease: Secondary | ICD-10-CM | POA: Diagnosis not present

## 2017-09-30 DIAGNOSIS — Z96642 Presence of left artificial hip joint: Secondary | ICD-10-CM | POA: Diagnosis not present

## 2017-09-30 DIAGNOSIS — M1612 Unilateral primary osteoarthritis, left hip: Secondary | ICD-10-CM | POA: Diagnosis not present

## 2017-09-30 DIAGNOSIS — N289 Disorder of kidney and ureter, unspecified: Secondary | ICD-10-CM | POA: Insufficient documentation

## 2017-09-30 DIAGNOSIS — I10 Essential (primary) hypertension: Secondary | ICD-10-CM | POA: Insufficient documentation

## 2017-09-30 DIAGNOSIS — Z471 Aftercare following joint replacement surgery: Secondary | ICD-10-CM | POA: Diagnosis not present

## 2017-11-18 DIAGNOSIS — D124 Benign neoplasm of descending colon: Secondary | ICD-10-CM | POA: Diagnosis not present

## 2017-11-18 DIAGNOSIS — K640 First degree hemorrhoids: Secondary | ICD-10-CM | POA: Diagnosis not present

## 2017-11-18 DIAGNOSIS — Z1211 Encounter for screening for malignant neoplasm of colon: Secondary | ICD-10-CM | POA: Diagnosis not present

## 2017-11-25 DIAGNOSIS — R972 Elevated prostate specific antigen [PSA]: Secondary | ICD-10-CM | POA: Diagnosis not present

## 2018-01-20 DIAGNOSIS — N184 Chronic kidney disease, stage 4 (severe): Secondary | ICD-10-CM | POA: Diagnosis not present

## 2018-01-20 DIAGNOSIS — N2581 Secondary hyperparathyroidism of renal origin: Secondary | ICD-10-CM | POA: Diagnosis not present

## 2018-01-20 DIAGNOSIS — D649 Anemia, unspecified: Secondary | ICD-10-CM | POA: Diagnosis not present

## 2018-01-20 DIAGNOSIS — I129 Hypertensive chronic kidney disease with stage 1 through stage 4 chronic kidney disease, or unspecified chronic kidney disease: Secondary | ICD-10-CM | POA: Diagnosis not present

## 2018-03-15 DIAGNOSIS — M25561 Pain in right knee: Secondary | ICD-10-CM | POA: Diagnosis not present

## 2018-03-15 DIAGNOSIS — M1712 Unilateral primary osteoarthritis, left knee: Secondary | ICD-10-CM | POA: Diagnosis not present

## 2018-03-15 DIAGNOSIS — M1711 Unilateral primary osteoarthritis, right knee: Secondary | ICD-10-CM | POA: Diagnosis not present

## 2018-04-08 DIAGNOSIS — M1612 Unilateral primary osteoarthritis, left hip: Secondary | ICD-10-CM | POA: Diagnosis not present

## 2018-04-08 DIAGNOSIS — Z96649 Presence of unspecified artificial hip joint: Secondary | ICD-10-CM | POA: Insufficient documentation

## 2018-04-21 DIAGNOSIS — M1711 Unilateral primary osteoarthritis, right knee: Secondary | ICD-10-CM | POA: Diagnosis not present

## 2018-04-28 DIAGNOSIS — M1711 Unilateral primary osteoarthritis, right knee: Secondary | ICD-10-CM | POA: Diagnosis not present

## 2018-05-05 DIAGNOSIS — M1711 Unilateral primary osteoarthritis, right knee: Secondary | ICD-10-CM | POA: Diagnosis not present

## 2018-05-12 DIAGNOSIS — I129 Hypertensive chronic kidney disease with stage 1 through stage 4 chronic kidney disease, or unspecified chronic kidney disease: Secondary | ICD-10-CM | POA: Diagnosis not present

## 2018-05-12 DIAGNOSIS — N184 Chronic kidney disease, stage 4 (severe): Secondary | ICD-10-CM | POA: Diagnosis not present

## 2018-05-12 DIAGNOSIS — D649 Anemia, unspecified: Secondary | ICD-10-CM | POA: Diagnosis not present

## 2018-05-24 ENCOUNTER — Other Ambulatory Visit: Payer: Self-pay

## 2018-05-24 ENCOUNTER — Encounter (HOSPITAL_BASED_OUTPATIENT_CLINIC_OR_DEPARTMENT_OTHER): Payer: Self-pay

## 2018-05-24 ENCOUNTER — Ambulatory Visit (HOSPITAL_COMMUNITY)
Admission: EM | Admit: 2018-05-24 | Discharge: 2018-05-24 | Disposition: A | Discharge status: Home / Self Care | Payer: 59 | Source: Home / Self Care

## 2018-05-24 ENCOUNTER — Emergency Department (HOSPITAL_BASED_OUTPATIENT_CLINIC_OR_DEPARTMENT_OTHER): Payer: 59

## 2018-05-24 ENCOUNTER — Encounter (HOSPITAL_COMMUNITY): Payer: Self-pay

## 2018-05-24 ENCOUNTER — Emergency Department (HOSPITAL_BASED_OUTPATIENT_CLINIC_OR_DEPARTMENT_OTHER)
Admission: EM | Admit: 2018-05-24 | Discharge: 2018-05-24 | Disposition: A | Discharge status: Home / Self Care | Payer: 59 | Attending: Emergency Medicine | Admitting: Emergency Medicine

## 2018-05-24 DIAGNOSIS — Z96642 Presence of left artificial hip joint: Secondary | ICD-10-CM | POA: Diagnosis not present

## 2018-05-24 DIAGNOSIS — Z96652 Presence of left artificial knee joint: Secondary | ICD-10-CM | POA: Diagnosis not present

## 2018-05-24 DIAGNOSIS — I129 Hypertensive chronic kidney disease with stage 1 through stage 4 chronic kidney disease, or unspecified chronic kidney disease: Secondary | ICD-10-CM | POA: Diagnosis not present

## 2018-05-24 DIAGNOSIS — N184 Chronic kidney disease, stage 4 (severe): Secondary | ICD-10-CM | POA: Insufficient documentation

## 2018-05-24 DIAGNOSIS — Z7982 Long term (current) use of aspirin: Secondary | ICD-10-CM | POA: Insufficient documentation

## 2018-05-24 DIAGNOSIS — M10031 Idiopathic gout, right wrist: Secondary | ICD-10-CM | POA: Insufficient documentation

## 2018-05-24 DIAGNOSIS — M109 Gout, unspecified: Secondary | ICD-10-CM

## 2018-05-24 DIAGNOSIS — M25531 Pain in right wrist: Secondary | ICD-10-CM | POA: Diagnosis not present

## 2018-05-24 DIAGNOSIS — Z79899 Other long term (current) drug therapy: Secondary | ICD-10-CM | POA: Insufficient documentation

## 2018-05-24 MED ORDER — TRAMADOL HCL 50 MG PO TABS
ORAL_TABLET | ORAL | Status: AC
  Filled 2018-05-24: qty 1

## 2018-05-24 MED ORDER — TRAMADOL HCL 50 MG PO TABS
50.0000 mg | ORAL_TABLET | Freq: Once | ORAL | Status: AC
  Administered 2018-05-24: 50 mg via ORAL

## 2018-05-24 MED ORDER — TRAMADOL HCL 50 MG PO TABS: 50.0000 mg | ORAL_TABLET | Freq: Four times a day (QID) | ORAL | 0 refills | Status: DC | PRN

## 2018-05-24 MED ORDER — PREDNISONE 50 MG PO TABS: 50.0000 mg | ORAL_TABLET | Freq: Every day | ORAL | 0 refills | Status: DC

## 2018-05-24 NOTE — ED Triage Notes (Addendum)
Pt sent from UC due to elevated BP-reports hx of HTN-missed pm dose-pt would like to also be seen for c/o he presented to UC for-right hand/wrist pain swelling x 3 days-denies injury-NAD-steady gait

## 2018-05-24 NOTE — Discharge Instructions (Addendum)
Return here as needed. The x-rays showed arthritis but this is gout most likely. Follow up with your doctor.

## 2018-05-24 NOTE — ED Triage Notes (Signed)
Pt presents with swelling and pain in right hand not associated with any injury.

## 2018-05-24 NOTE — ED Notes (Signed)
Pt discharged to ER for elevated blood pressure per provider

## 2018-05-26 NOTE — ED Provider Notes (Signed)
[facility] Provider Note   CSN: [id number] Arrival date & time: 05/24/18  2056     History   Chief Complaint Chief Complaint  Patient presents with  . Hypertension  . Hand Pain    HPI Marvin Garcia is a 62 y.o. male.  HPI Patient presents to the emergency department with right wrist pain that has been ongoing over the last 2 days.  The patient states there is swelling redness and increased warmth to the area.  He states he was recently treated for gout in his left toe.  States that he is out of that medication.  The patient states nothing seems to make the condition better but certain movements palpation make the pain worse.  Patient denies fever, chest pain, shortness of breath, nausea, vomiting, fever, weakness, dizziness, numbness, near-syncope or syncope. Past Medical History:  Diagnosis Date  . Arrhythmia 1991   patient  describes that he woke up surrounded by doctors saying that his "heart had stopped while sleeping"; denies having pain during this event; also denies any reccurrent issues after that episode; says that was "when they told me i had high blood pressure"   . Arthritis   . Chronic kidney disease    stage IV managed by Dr Margo Aye kidney   . Headache   . Hypertension   . Umbilical hernia     Patient Active Problem List   Diagnosis Date Noted  . Osteoarthritis of left hip 03/19/2017    Past Surgical History:  Procedure Laterality Date  . HERNIA REPAIR     inguinal   . INSERTION OF MESH N/A 01/21/2017   Procedure: INSERTION OF MESH;  Surgeon: Clovis Riley, MD;  Location: [facility];  Service: General;  Laterality: N/A;  . JOINT REPLACEMENT    . KNEE ARTHROSCOPY    . TOTAL HIP ARTHROPLASTY Left 03/19/2017   Procedure: LEFT TOTAL HIP ARTHROPLASTY ANTERIOR APPROACH;  Surgeon: Rod Can, MD;  Location: [facility];  Service: Orthopedics;  Laterality: Left;  Needs RNFA  . TOTAL KNEE ARTHROPLASTY  06/04/2012   Procedure: TOTAL KNEE ARTHROPLASTY;  Surgeon: Sydnee Cabal, MD;  Location: [facility];  Service: Orthopedics;  Laterality: Left;  . UMBILICAL HERNIA REPAIR N/A 01/21/2017   Procedure: UMBILICAL HERNIA REPAIR WITH MESH;  Surgeon: Clovis Riley, MD;  Location: [facility];  Service: General;  Laterality: N/A;        Home Medications    Prior to Admission medications   Medication Sig Start Date End Date Taking? Authorizing Provider  Ascorbic Acid (VITAMIN C) 1000 MG tablet Take 1,000 mg by mouth daily.    [provider]  aspirin 81 MG chewable tablet Chew 1 tablet (81 mg total) by mouth 2 (two) times daily. 03/20/17   Swinteck, Aaron Edelman, MD  atenolol (TENORMIN) 100 MG tablet Take 100 mg by mouth daily. EVERY AM    [provider]  atorvastatin (LIPITOR) 20 MG tablet Take 20 mg by mouth every evening.     [provider]  calcitRIOL (ROCALTROL) 0.25 MCG capsule Take 0.25 mcg by mouth every other day.    [provider]  cloNIDine (CATAPRES) 0.2 MG tablet Take 0.2 mg by mouth 3 (three) times daily. 02/26/17   [provider]  Coenzyme Q10 (COQ10) 100 MG CAPS Take 1 capsule by mouth daily.    [provider]  docusate sodium (COLACE) 100 MG capsule Take 1 capsule (100 mg total) by mouth 2 (two) times daily. 03/20/17  Swinteck, Aaron Edelman, MD  febuxostat (ULORIC) 40 MG tablet Take 40 mg by mouth daily.    [provider]  furosemide (LASIX) 40 MG tablet Take 40-80 mg by mouth See admin instructions. 80 mg every morning, and 40 mg every evening    [provider]  hydrALAZINE (APRESOLINE) 100 MG tablet Take 100 mg by mouth 3 (three) times daily.    [provider]  irbesartan (AVAPRO) 300 MG tablet Take 300 mg by mouth daily.    [provider]  Javier Docker Oil 350 MG CAPS Take 350 mg by mouth daily.    [provider]  methocarbamol (ROBAXIN) 500 MG tablet Take 1 tablet (500 mg total) by mouth every 6 (six) hours as  needed for muscle spasms. 03/20/17   Swinteck, Aaron Edelman, MD  Multiple Vitamins-Minerals (CENTRUM SILVER 50+MEN PO) Take 1 tablet by mouth daily.    [provider]  ondansetron (ZOFRAN) 4 MG tablet Take 1 tablet (4 mg total) by mouth every 6 (six) hours as needed for nausea. 03/20/17   Swinteck, Aaron Edelman, MD  oxyCODONE-acetaminophen (ROXICET) 5-325 MG tablet Take 1-2 tablets by mouth every 4 (four) hours as needed for severe pain. 03/20/17   Swinteck, Aaron Edelman, MD  predniSONE (DELTASONE) 50 MG tablet Take 1 tablet (50 mg total) by mouth daily. 05/24/18   Kitrina Maurin, Harrell Gave, PA-C  senna (SENOKOT) 8.6 MG TABS tablet Take 2 tablets (17.2 mg total) by mouth at bedtime. 03/20/17   Swinteck, Aaron Edelman, MD  traMADol (ULTRAM) 50 MG tablet Take 1 tablet (50 mg total) by mouth every 6 (six) hours as needed for severe pain. 05/24/18   Dalia Heading, PA-C    Family History Family History  Problem Relation Age of Onset  . Diabetes Mother   . Heart disease Father   . Asthma Son   . Diabetes Maternal Grandmother     Social History Social History   Tobacco Use  . Smoking status: Never Smoker  . Smokeless tobacco: Never Used  Substance Use Topics  . Alcohol use: No  . Drug use: No     Allergies   Hydrocodone   Review of Systems Review of Systems All other systems negative except as documented in the HPI. All pertinent positives and negatives as reviewed in the HPI.  Physical Exam Updated Vital Signs BP (!) 211/124   Pulse 86   Temp 99.2 F (37.3 C) (Oral)   Resp 18   Ht 5\' 7"  (1.702 m)   Wt 120 kg   SpO2 96%   BMI 41.43 kg/m   Physical Exam  Constitutional: He is oriented to person, place, and time. He appears well-developed and well-nourished. No distress.  HENT:  Head: Normocephalic and atraumatic.  Eyes: Pupils are equal, round, and reactive to light.  Pulmonary/Chest: Effort normal.  Musculoskeletal:       Arms: Neurological: He is alert and oriented to person, place, and  time.  Skin: Skin is warm and dry.  Psychiatric: He has a normal mood and affect.  Nursing note and vitals reviewed.    ED Treatments / Results  Labs (all labs ordered are listed, but only abnormal results are displayed) Labs Reviewed - No data to display  EKG None  Radiology Dg Wrist Complete Right  Result Date: 05/24/2018 CLINICAL DATA:  Right wrist pain x3 days EXAM: RIGHT WRIST - COMPLETE 3+ VIEW COMPARISON:  None. FINDINGS: Osteoarthritis at the base of the thumb metacarpal and triscaphe joint. Subcortical cystic change of the distal ulna ulnar  styloid. Remodeled appearance of the distal radioulnar joint with erosive change. Carpal rows are maintained. No acute fracture nor bone destruction. Soft tissue swelling is identified about the wrist. IMPRESSION: Osteoarthritis of the first CMC and triscaphe joints. Erosive change of the distal radioulnar joint with sub cortical cystic change as above. No acute osseous abnormality. Electronically Signed   By: Ashley Royalty M.D.   On: 05/24/2018 23:11    Procedures Procedures (including critical care time)  Medications Ordered in ED Medications  traMADol (ULTRAM) tablet 50 mg (50 mg Oral Given 05/24/18 2339)     Initial Impression / Assessment and Plan / ED Course  I have reviewed the triage vital signs and the nursing notes.  Pertinent labs & imaging results that were available during my care of the patient were reviewed by me and considered in my medical decision making (see chart for details).     Patient will be treated for gout of the right wrist.  Advised patient follow-up with his primary doctor told return here as needed.  Patient does not have any signs that appear to be of a septic joint at this time.  Final Clinical Impressions(s) / ED Diagnoses   Final diagnoses:  Acute gout of right wrist, unspecified cause    ED Discharge Orders         Ordered    predniSONE (DELTASONE) 50 MG tablet  Daily     05/24/18 2329     traMADol (ULTRAM) 50 MG tablet  Every 6 hours PRN     05/24/18 2329           Dalia Heading, PA-C 05/26/18 0012    Blanchie Dessert, MD 05/26/18 1911

## 2018-05-31 DIAGNOSIS — M25531 Pain in right wrist: Secondary | ICD-10-CM | POA: Diagnosis not present

## 2018-09-16 NOTE — Progress Notes (Signed)
Please place orders in Epic for patient who has a Pre-op appointment on 09/22/18 for surgery on 10/01/18! Thank you!

## 2018-09-20 ENCOUNTER — Encounter: Payer: Self-pay | Admitting: Physician Assistant

## 2018-09-20 ENCOUNTER — Ambulatory Visit: Payer: Self-pay | Admitting: Orthopedic Surgery

## 2018-09-20 NOTE — Patient Instructions (Signed)
Marvin Garcia  09/20/2018   Your procedure is scheduled on: Friday 10/01/2018  Report to [facility] Main  Entrance              Report to admitting at   0530  AM    Call this number if you have problems the morning of surgery [phone]    Remember: Do not eat food or drink liquids :After Midnight.             BRUSH YOUR TEETH MORNING OF SURGERY AND RINSE YOUR MOUTH OUT, NO CHEWING GUM CANDY OR MINTS.     Take these medicines the morning of surgery with A SIP OF WATER: Carvedilol (Coreg), Clonidine (Catapress), Hydralazine (Apresoline)                                  You may not have any metal on your body including hair pins and              piercings  Do not wear jewelry, make-up, lotions, powders or perfumes, deodorant                          Men may shave face and neck.   Do not bring valuables to the hospital. [place].  Contacts, dentures or bridgework may not be worn into surgery.  Leave suitcase in the car. After surgery it may be brought to your room.                  Please read over the following fact sheets you were given: _____________________________________________________________________             [facility] - Preparing for Surgery Before surgery, you can play an important role.  Because skin is not sterile, your skin needs to be as free of germs as possible.  You can reduce the number of germs on your skin by washing with CHG (chlorahexidine gluconate) soap before surgery.  CHG is an antiseptic cleaner which kills germs and bonds with the skin to continue killing germs even after washing. Please DO NOT use if you have an allergy to CHG or antibacterial soaps.  If your skin becomes reddened/irritated stop using the CHG and inform your nurse when you arrive at Short Stay. Do not shave (including legs and underarms) for at least 48 hours prior to the first CHG shower.  You may  shave your face/neck. Please follow these instructions carefully:  1.  Shower with CHG Soap the night before surgery and the  morning of Surgery.  2.  If you choose to wash your hair, wash your hair first as usual with your  normal  shampoo.  3.  After you shampoo, rinse your hair and body thoroughly to remove the  shampoo.                           4.  Use CHG as you would any other liquid soap.  You can apply chg directly  to the skin and wash                       Gently with  a scrungie or clean washcloth.  5.  Apply the CHG Soap to your body ONLY FROM THE NECK DOWN.   Do not use on face/ open                           Wound or open sores. Avoid contact with eyes, ears mouth and genitals (private parts).                       Wash face,  Genitals (private parts) with your normal soap.             6.  Wash thoroughly, paying special attention to the area where your surgery  will be performed.  7.  Thoroughly rinse your body with warm water from the neck down.  8.  DO NOT shower/wash with your normal soap after using and rinsing off  the CHG Soap.                9.  Pat yourself dry with a clean towel.            10.  Wear clean pajamas.            11.  Place clean sheets on your bed the night of your first shower and do not  sleep with pets. Day of Surgery : Do not apply any lotions/deodorants the morning of surgery.  Please wear clean clothes to the hospital/surgery center.  FAILURE TO FOLLOW THESE INSTRUCTIONS MAY RESULT IN THE CANCELLATION OF YOUR SURGERY PATIENT SIGNATURE_________________________________  NURSE SIGNATURE__________________________________  ________________________________________________________________________   [name]  An incentive spirometer is a tool that can help keep your lungs clear and active. This tool measures how well you are filling your lungs with each breath. Taking long deep breaths may help reverse or decrease the chance of developing  breathing (pulmonary) problems (especially infection) following:  A long period of time when you are unable to move or be active. BEFORE THE PROCEDURE   If the spirometer includes an indicator to show your best effort, your nurse or respiratory therapist will set it to a desired goal.  If possible, sit up straight or lean slightly forward. Try not to slouch.  Hold the incentive spirometer in an upright position. INSTRUCTIONS FOR USE  1. Sit on the edge of your bed if possible, or sit up as far as you can in bed or on a chair. 2. Hold the incentive spirometer in an upright position. 3. Breathe out normally. 4. Place the mouthpiece in your mouth and seal your lips tightly around it. 5. Breathe in slowly and as deeply as possible, raising the piston or the ball toward the top of the column. 6. Hold your breath for 3-5 seconds or for as long as possible. Allow the piston or ball to fall to the bottom of the column. 7. Remove the mouthpiece from your mouth and breathe out normally. 8. Rest for a few seconds and repeat Steps 1 through 7 at least 10 times every 1-2 hours when you are awake. Take your time and take a few normal breaths between deep breaths. 9. The spirometer may include an indicator to show your best effort. Use the indicator as a goal to work toward during each repetition. 10. After each set of 10 deep breaths, practice coughing to be sure your lungs are clear. If you have an incision (the cut made at the time of surgery), support your  incision when coughing by placing a pillow or rolled up towels firmly against it. Once you are able to get out of bed, walk around indoors and cough well. You may stop using the incentive spirometer when instructed by your caregiver.  RISKS AND COMPLICATIONS  Take your time so you do not get dizzy or light-headed.  If you are in pain, you may need to take or ask for pain medication before doing incentive spirometry. It is harder to take a deep  breath if you are having pain. AFTER USE  Rest and breathe slowly and easily.  It can be helpful to keep track of a log of your progress. Your caregiver can provide you with a simple table to help with this. If you are using the spirometer at home, follow these instructions: [place] IF:   You are having difficultly using the spirometer.  You have trouble using the spirometer as often as instructed.  Your pain medication is not giving enough relief while using the spirometer.  You develop fever of 100.5 F (38.1 C) or higher. SEEK IMMEDIATE MEDICAL CARE IF:   You cough up bloody sputum that had not been present before.  You develop fever of 102 F (38.9 C) or greater.  You develop worsening pain at or near the incision site. MAKE SURE YOU:   Understand these instructions.  Will watch your condition.  Will get help right away if you are not doing well or get worse. Document Released: 12/08/2006 Document Revised: 10/20/2011 Document Reviewed: 02/08/2007 [facility] Patient Information 2014 [place], [place].   ________________________________________________________________________

## 2018-09-22 ENCOUNTER — Encounter (HOSPITAL_COMMUNITY): Payer: Self-pay

## 2018-09-22 ENCOUNTER — Encounter (HOSPITAL_COMMUNITY)
Admission: RE | Admit: 2018-09-22 | Discharge: 2018-09-22 | Disposition: A | Discharge status: Home / Self Care | Payer: 59 | Source: Ambulatory Visit | Attending: Specialist | Admitting: Specialist

## 2018-09-22 ENCOUNTER — Other Ambulatory Visit: Payer: Self-pay

## 2018-09-22 DIAGNOSIS — Z01818 Encounter for other preprocedural examination: Secondary | ICD-10-CM | POA: Insufficient documentation

## 2018-09-22 DIAGNOSIS — I10 Essential (primary) hypertension: Secondary | ICD-10-CM | POA: Insufficient documentation

## 2018-09-22 LAB — SURGICAL PCR SCREEN
MRSA, PCR: NEGATIVE
Staphylococcus aureus: NEGATIVE

## 2018-09-22 LAB — PROTIME-INR
INR: 0.93
Prothrombin Time: 12.4 seconds (ref 11.4–15.2)

## 2018-09-22 LAB — URINALYSIS, ROUTINE W REFLEX MICROSCOPIC
BACTERIA UA: NONE SEEN
Bilirubin Urine: NEGATIVE
Glucose, UA: NEGATIVE mg/dL
Hgb urine dipstick: NEGATIVE
KETONES UR: NEGATIVE mg/dL
Leukocytes,Ua: NEGATIVE
Nitrite: NEGATIVE
PROTEIN: 100 mg/dL — AB
Specific Gravity, Urine: 1.008 (ref 1.005–1.030)
pH: 6 (ref 5.0–8.0)

## 2018-09-22 LAB — BASIC METABOLIC PANEL
Anion gap: 11 (ref 5–15)
BUN: 47 mg/dL — ABNORMAL HIGH (ref 8–23)
CHLORIDE: 106 mmol/L (ref 98–111)
CO2: 25 mmol/L (ref 22–32)
Calcium: 9.7 mg/dL (ref 8.9–10.3)
Creatinine, Ser: 2.96 mg/dL — ABNORMAL HIGH (ref 0.61–1.24)
GFR calc Af Amer: 25 mL/min — ABNORMAL LOW (ref >60)
GFR calc non Af Amer: 22 mL/min — ABNORMAL LOW (ref >60)
GLUCOSE: 107 mg/dL — AB (ref 70–99)
Potassium: 3.5 mmol/L (ref 3.5–5.1)
Sodium: 142 mmol/L (ref 135–145)

## 2018-09-22 LAB — CBC
HEMATOCRIT: 38.9 % — AB (ref 39.0–52.0)
Hemoglobin: 12.7 g/dL — ABNORMAL LOW (ref 13.0–17.0)
MCH: 25.2 pg — ABNORMAL LOW (ref 26.0–34.0)
MCHC: 32.6 g/dL (ref 30.0–36.0)
MCV: 77.3 fL — ABNORMAL LOW (ref 80.0–100.0)
Platelets: 288 10*3/uL (ref 150–400)
RBC: 5.03 MIL/uL (ref 4.22–5.81)
RDW: 15.9 % — ABNORMAL HIGH (ref 11.5–15.5)
WBC: 9.9 10*3/uL (ref 4.0–10.5)
nRBC: 0 % (ref 0.0–0.2)

## 2018-09-22 LAB — APTT: aPTT: 29 seconds (ref 24–36)

## 2018-09-22 NOTE — Progress Notes (Signed)
   09/22/18 0953  OBSTRUCTIVE SLEEP APNEA  Have you ever been diagnosed with sleep apnea through a sleep study? No  Do you snore loudly (loud enough to be heard through closed doors)?  0  Do you often feel tired, fatigued, or sleepy during the daytime (such as falling asleep during driving or talking to someone)? 0  Has anyone observed you stop breathing during your sleep? 0  Do you have, or are you being treated for high blood pressure? 1  BMI more than 35 kg/m2? 1  Age > 50 (1-yes) 1  Neck circumference greater than:Male 16 inches or larger, Male 17inches or larger? 1  Male Gender (Yes=1) 1  Obstructive Sleep Apnea Score 5  Score 5 or greater  Results sent to PCP

## 2018-09-27 NOTE — Progress Notes (Signed)
09/22/2018- Clearance from Dr. Hollie Salk on chart and Office note on chart.

## 2018-10-01 ENCOUNTER — Ambulatory Visit (HOSPITAL_COMMUNITY): Admission: RE | Admit: 2018-10-01 | Payer: 59 | Source: Home / Self Care | Admitting: Specialist

## 2018-10-01 ENCOUNTER — Encounter (HOSPITAL_COMMUNITY): Admission: RE | Payer: Self-pay | Source: Home / Self Care

## 2018-10-01 LAB — TYPE AND SCREEN
ABO/RH(D): O POS
Antibody Screen: NEGATIVE

## 2018-10-01 SURGERY — ARTHROPLASTY, KNEE, TOTAL
Anesthesia: Spinal | Laterality: Right

## 2019-01-01 IMAGING — CR DG WRIST COMPLETE 3+V*R*
4 series · 4 of 4 positions shown · non-contrast
Comparison: None.

CLINICAL DATA: Right wrist pain x3 days

EXAM:
RIGHT WRIST - COMPLETE 3+ VIEW

[x wrist pa right]
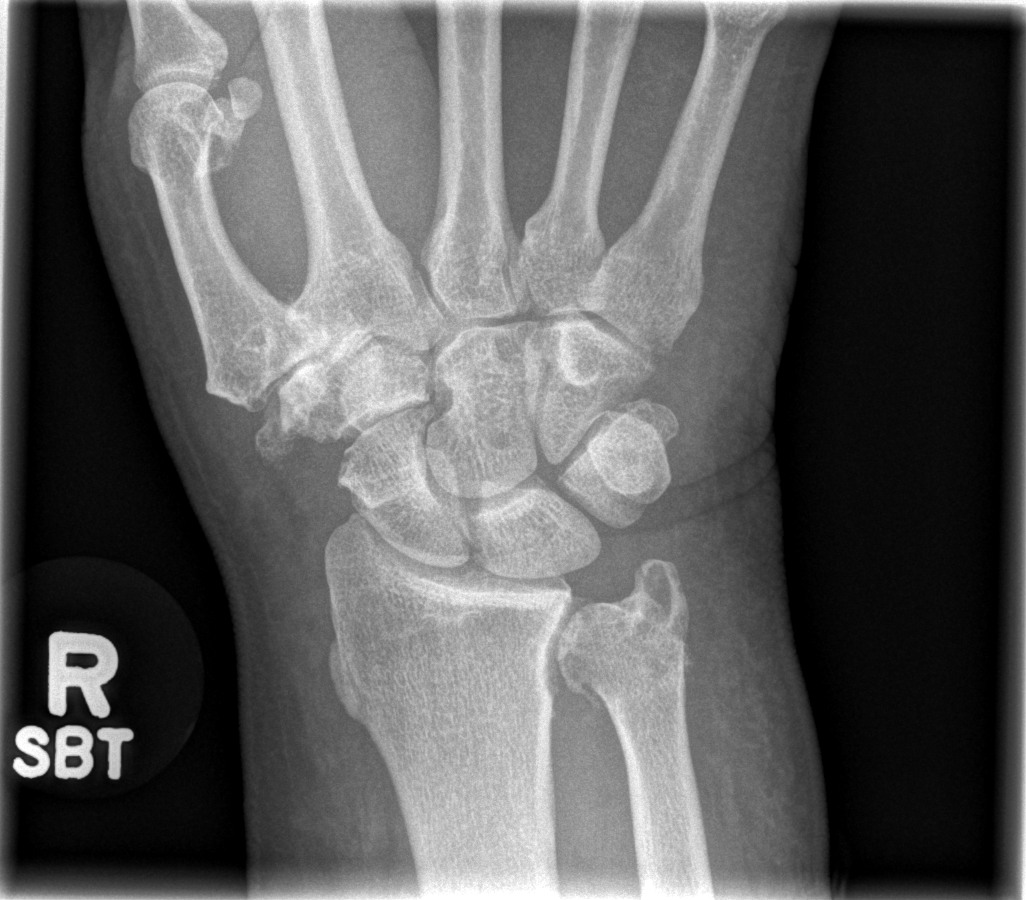

[x wrist obl right]
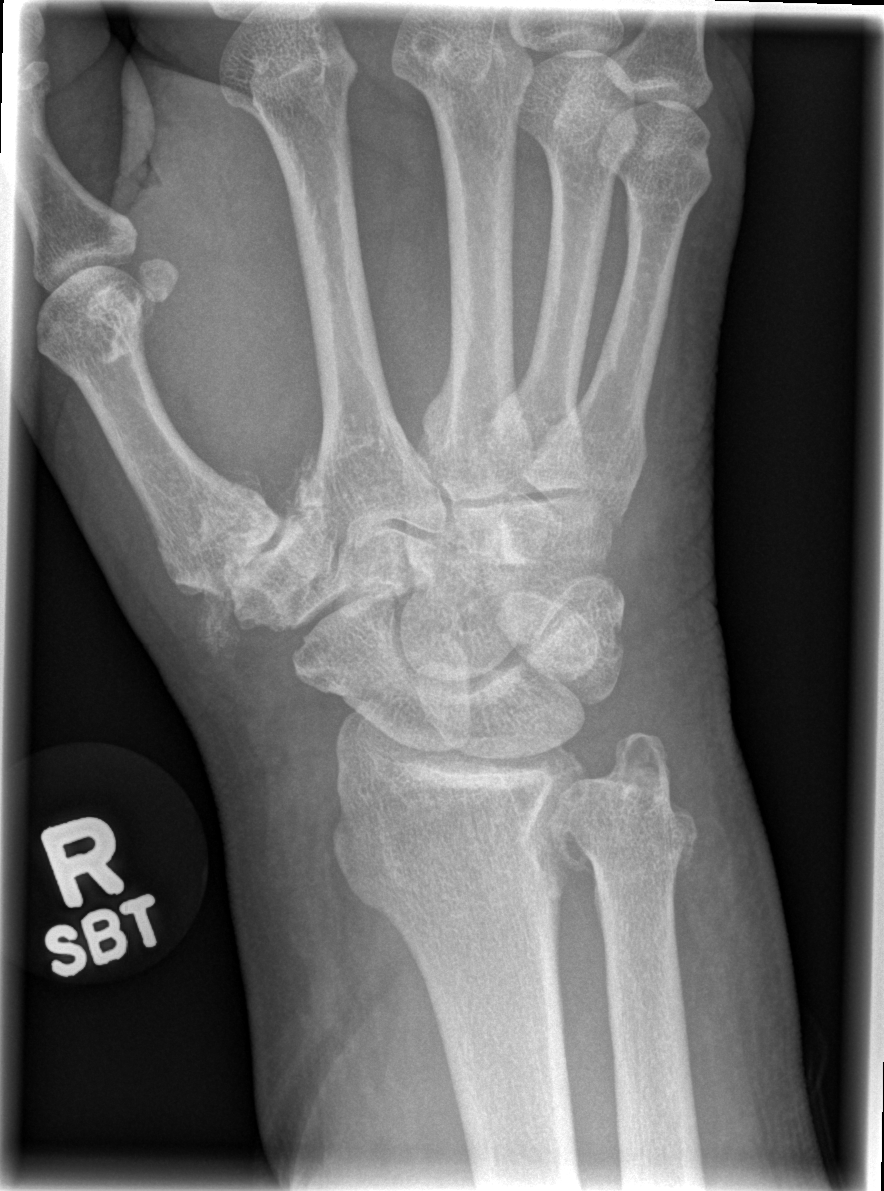

[x wrist lat right]
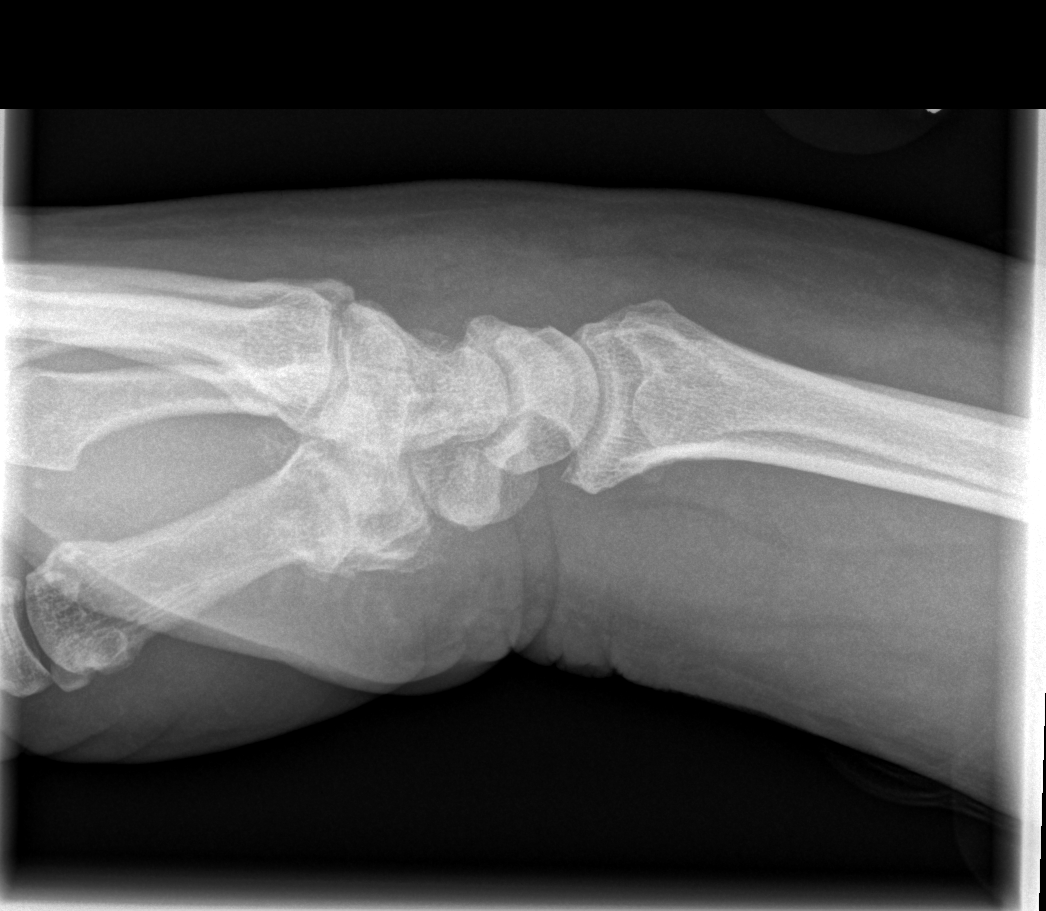

[x navicular]
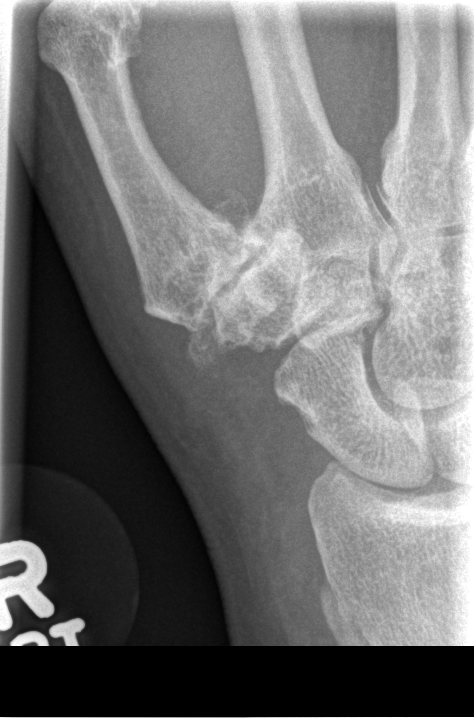

[4 of 4 positions shown; findings below may reference images not displayed]

FINDINGS: Osteoarthritis at the base of the thumb metacarpal and triscaphe
joint. Subcortical cystic change of the distal ulna ulnar styloid.
Remodeled appearance of the distal radioulnar joint with erosive
change. Carpal rows are maintained. No acute fracture nor bone
destruction. Soft tissue swelling is identified about the wrist.
IMPRESSION: Osteoarthritis of the first CMC and triscaphe joints. Erosive change
of the distal radioulnar joint with sub cortical cystic change as
above. No acute osseous abnormality.

## 2019-09-13 DIAGNOSIS — M25561 Pain in right knee: Secondary | ICD-10-CM | POA: Insufficient documentation

## 2019-10-07 NOTE — Progress Notes (Signed)
Can you please place orders for the upcoming surgery.Pt. PST appointment is 10/10/19.Thank you.

## 2019-10-07 NOTE — Patient Instructions (Addendum)
DUE TO COVID-19 ONLY ONE VISITOR IS ALLOWED TO COME WITH YOU AND STAY IN THE WAITING ROOM ONLY DURING PRE OP AND PROCEDURE DAY OF SURGERY. THE 1 VISITOR MAY VISIT WITH YOU AFTER SURGERY IN YOUR PRIVATE ROOM DURING VISITING HOURS ONLY!  YOU NEED TO HAVE A COVID 19 TEST ON: 10/11/19 @ 2:05       , THIS TEST MUST BE DONE BEFORE SURGERY, COME  [place], [place] , [address].  ([place]) ONCE YOUR COVID TEST IS COMPLETED, PLEASE BEGIN THE QUARANTINE INSTRUCTIONS AS OUTLINED IN YOUR HANDOUT.                Marvin Garcia     Your procedure is scheduled on: 10/14/19   Report to [facility] Main  Entrance   Report to admitting at: 8:00 AM     Call this number if you have problems the morning of surgery [phone]    Remember: Do not eat food or drink liquids :After Midnight.   BRUSH YOUR TEETH MORNING OF SURGERY AND RINSE YOUR MOUTH OUT, NO CHEWING GUM CANDY OR MINTS.    Take these medicines the morning of surgery with A SIP OF WATER: amlodipine,carvedilol,clonidine,colchicine,hydralazine,isosorbide,febuxostat.                                 You may not have any metal on your body including             piercings  Do not wear jewelry,lotions, powders or  deodorant             Men may shave face and neck.   Do not bring valuables to the hospital. [place].  Contacts, dentures or bridgework may not be worn into surgery.      Patients discharged the day of surgery will not be allowed to drive home. IF YOU ARE HAVING SURGERY AND GOING HOME THE SAME DAY, YOU MUST HAVE AN ADULT TO DRIVE YOU HOME AND BE WITH YOU FOR 24 HOURS. YOU MAY GO HOME BY TAXI OR UBER OR ORTHERWISE, BUT AN ADULT MUST ACCOMPANY YOU HOME AND STAY WITH YOU FOR 24 HOURS.  Name and phone number of your driver:  Special Instructions: N/A              Please read over the following fact sheets you were  given: _____________________________________________________________________             [facility] - Preparing for Surgery Before surgery, you can play an important role.  Because skin is not sterile, your skin needs to be as free of germs as possible.  You can reduce the number of germs on your skin by washing with CHG (chlorahexidine gluconate) soap before surgery.  CHG is an antiseptic cleaner which kills germs and bonds with the skin to continue killing germs even after washing. Please DO NOT use if you have an allergy to CHG or antibacterial soaps.  If your skin becomes reddened/irritated stop using the CHG and inform your nurse when you arrive at Short Stay. Do not shave (including legs and underarms) for at least 48 hours prior to the first CHG shower.  You may shave your face/neck. Please follow these instructions carefully:  1.  Shower with CHG Soap the night before surgery and the  morning of Surgery.  2.  If you choose to wash your hair, wash your hair first as usual with your  normal  shampoo.  3.  After you shampoo, rinse your hair and body thoroughly to remove the  shampoo.                                        4.  Use CHG as you would any other liquid soap.  You can apply chg directly  to the skin and wash                       Gently with a scrungie or clean washcloth.  5.  Apply the CHG Soap to your body ONLY FROM THE NECK DOWN.   Do not use on face/ open                           Wound or open sores. Avoid contact with eyes, ears mouth and genitals (private parts).                       Wash face,  Genitals (private parts) with your normal soap.             6.  Wash thoroughly, paying special attention to the area where your surgery  will be performed.  7.  Thoroughly rinse your body with warm water from the neck down.  8.  DO NOT shower/wash with your normal soap after using and rinsing off  the CHG Soap.             9  Pat yourself dry with a clean towel.            10.   Wear clean pajamas.            11.  Place clean sheets on your bed the night of your first shower and do not  sleep with pets. Day of Surgery : Do not apply any lotions/deodorants the morning of surgery.  Please wear clean clothes to the hospital/surgery center.  FAILURE TO FOLLOW THESE INSTRUCTIONS MAY RESULT IN THE CANCELLATION OF YOUR SURGERY PATIENT SIGNATURE_________________________________  NURSE SIGNATURE__________________________________  ________________________________________________________________________

## 2019-10-10 ENCOUNTER — Other Ambulatory Visit: Payer: Self-pay

## 2019-10-10 ENCOUNTER — Encounter (HOSPITAL_COMMUNITY)
Admission: RE | Admit: 2019-10-10 | Discharge: 2019-10-10 | Disposition: A | Discharge status: Home / Self Care | Payer: 59 | Source: Ambulatory Visit | Attending: Specialist | Admitting: Specialist

## 2019-10-10 ENCOUNTER — Encounter (HOSPITAL_COMMUNITY): Payer: Self-pay

## 2019-10-10 DIAGNOSIS — M1711 Unilateral primary osteoarthritis, right knee: Secondary | ICD-10-CM | POA: Diagnosis not present

## 2019-10-10 DIAGNOSIS — I129 Hypertensive chronic kidney disease with stage 1 through stage 4 chronic kidney disease, or unspecified chronic kidney disease: Secondary | ICD-10-CM | POA: Diagnosis not present

## 2019-10-10 DIAGNOSIS — Z7982 Long term (current) use of aspirin: Secondary | ICD-10-CM | POA: Diagnosis not present

## 2019-10-10 DIAGNOSIS — N184 Chronic kidney disease, stage 4 (severe): Secondary | ICD-10-CM | POA: Diagnosis not present

## 2019-10-10 DIAGNOSIS — Z79899 Other long term (current) drug therapy: Secondary | ICD-10-CM | POA: Insufficient documentation

## 2019-10-10 DIAGNOSIS — Z7901 Long term (current) use of anticoagulants: Secondary | ICD-10-CM | POA: Insufficient documentation

## 2019-10-10 DIAGNOSIS — Z01812 Encounter for preprocedural laboratory examination: Secondary | ICD-10-CM | POA: Diagnosis not present

## 2019-10-10 LAB — COMPREHENSIVE METABOLIC PANEL
ALT: 17 U/L (ref 0–44)
AST: 17 U/L (ref 15–41)
Albumin: 4.7 g/dL (ref 3.5–5.0)
Alkaline Phosphatase: 60 U/L (ref 38–126)
Anion gap: 11 (ref 5–15)
BUN: 75 mg/dL — ABNORMAL HIGH (ref 8–23)
CO2: 23 mmol/L (ref 22–32)
Calcium: 9.9 mg/dL (ref 8.9–10.3)
Chloride: 107 mmol/L (ref 98–111)
Creatinine, Ser: 4.09 mg/dL — ABNORMAL HIGH (ref 0.61–1.24)
GFR calc Af Amer: 17 mL/min — ABNORMAL LOW (ref >60)
GFR calc non Af Amer: 15 mL/min — ABNORMAL LOW (ref >60)
Glucose, Bld: 94 mg/dL (ref 70–99)
Potassium: 3.5 mmol/L (ref 3.5–5.1)
Sodium: 141 mmol/L (ref 135–145)
Total Bilirubin: 0.8 mg/dL (ref 0.3–1.2)
Total Protein: 8.5 g/dL — ABNORMAL HIGH (ref 6.5–8.1)

## 2019-10-10 LAB — SURGICAL PCR SCREEN
MRSA, PCR: NEGATIVE
Staphylococcus aureus: NEGATIVE

## 2019-10-10 LAB — CBC
HCT: 36.5 % — ABNORMAL LOW (ref 39.0–52.0)
Hemoglobin: 12.2 g/dL — ABNORMAL LOW (ref 13.0–17.0)
MCH: 24.2 pg — ABNORMAL LOW (ref 26.0–34.0)
MCHC: 33.4 g/dL (ref 30.0–36.0)
MCV: 72.4 fL — ABNORMAL LOW (ref 80.0–100.0)
Platelets: 307 10*3/uL (ref 150–400)
RBC: 5.04 MIL/uL (ref 4.22–5.81)
RDW: 16.8 % — ABNORMAL HIGH (ref 11.5–15.5)
WBC: 10 10*3/uL (ref 4.0–10.5)
nRBC: 0 % (ref 0.0–0.2)

## 2019-10-10 NOTE — H&P (Signed)
TOTAL KNEE ADMISSION H&P  Patient is being admitted for right total knee arthroplasty.  Subjective:  Chief Complaint:right knee pain.  HPI: Marvin Garcia, 64 y.o. male, has a history of pain and functional disability in the right knee due to arthritis and has failed non-surgical conservative treatments for greater than 12 weeks to includeNSAID's and/or analgesics, corticosteriod injections, viscosupplementation injections, flexibility and strengthening excercises, supervised PT with diminished ADL's post treatment, weight reduction as appropriate and activity modification.  Onset of symptoms was gradual, starting 3 years ago with gradually worsening course since that time. The patient noted prior procedures on the knee to include  arthroscopy and menisectomy on the right knee(s).  Patient currently rates pain in the right knee(s) at 6 out of 10 with activity. Patient has night pain, worsening of pain with activity and weight bearing, pain that interferes with activities of daily living, pain with passive range of motion, crepitus and joint swelling.  Patient has evidence of periarticular osteophytes and joint space narrowing by imaging studies. This patient has had No previous injury. There is no active infection.  Patient Active Problem List   Diagnosis Date Noted  . Osteoarthritis of left hip 03/19/2017   Past Medical History:  Diagnosis Date  . Arrhythmia 1991   patient  describes that he woke up surrounded by doctors saying that his "heart had stopped while sleeping"; denies having pain during this event; also denies any reccurrent issues after that episode; says that was "when they told me i had high blood pressure"   . Arthritis   . Chronic kidney disease    stage IV managed by Dr Margo Aye kidney   . Headache   . Hypertension   . Umbilical hernia     Past Surgical History:  Procedure Laterality Date  . HERNIA REPAIR     inguinal   . INSERTION OF MESH N/A 01/21/2017   Procedure: INSERTION OF MESH;  Surgeon: Clovis Riley, MD;  Location: [facility];  Service: General;  Laterality: N/A;  . JOINT REPLACEMENT    . KNEE ARTHROSCOPY    . TOTAL HIP ARTHROPLASTY Left 03/19/2017   Procedure: LEFT TOTAL HIP ARTHROPLASTY ANTERIOR APPROACH;  Surgeon: Rod Can, MD;  Location: [facility];  Service: Orthopedics;  Laterality: Left;  Needs RNFA  . TOTAL KNEE ARTHROPLASTY  06/04/2012   Procedure: TOTAL KNEE ARTHROPLASTY;  Surgeon: Sydnee Cabal, MD;  Location: [facility];  Service: Orthopedics;  Laterality: Left;  . UMBILICAL HERNIA REPAIR N/A 01/21/2017   Procedure: UMBILICAL HERNIA REPAIR WITH MESH;  Surgeon: Clovis Riley, MD;  [facility]: [facility];  Service: General;  Laterality: N/A;    No current facility-administered medications for this encounter.   Current Outpatient Medications  Medication Sig Dispense Refill Last Dose  . acetaminophen (TYLENOL) 650 MG CR tablet Take 650-1,300 mg by mouth every 8 (eight) hours as needed for pain.     Marland Kitchen amLODipine (NORVASC) 10 MG tablet Take 10 mg by mouth at bedtime.      . Ascorbic Acid (VITAMIN C) 1000 MG tablet Take 1,000 mg by mouth daily.     Marland Kitchen aspirin 81 MG chewable tablet Chew 1 tablet (81 mg total) by mouth 2 (two) times daily. (Patient taking differently: Chew 81 mg by mouth daily. ) 6 tablet 1   . atorvastatin (LIPITOR) 20 MG tablet Take 20 mg by mouth every evening.      . calcitRIOL (ROCALTROL) 0.25 MCG capsule Take 0.25 mcg by mouth every other day.     Marland Kitchen  carvedilol (COREG) 25 MG tablet Take 25 mg by mouth 2 (two) times daily with a meal.     . cloNIDine (CATAPRES) 0.3 MG tablet Take 0.3 mg by mouth in the morning, at noon, and at bedtime.     . Coenzyme Q10 (COQ-10) 100 MG CAPS Take 100 mg by mouth daily.     . colchicine 0.6 MG tablet Take 0.6 mg by mouth daily as needed (for gout flare up).     . Febuxostat 80 MG TABS Take 80 mg by mouth daily.      . ferrous sulfate 325 (65 FE) MG tablet Take 325 mg by mouth daily.      . furosemide (LASIX) 40 MG tablet Take 40-80 mg by mouth See admin instructions. Take 80 mg by mouth every morning and take 40 mg by mouth every evening     . hydrALAZINE (APRESOLINE) 100 MG tablet Take 100 mg by mouth 3 (three) times daily.     . irbesartan (AVAPRO) 150 MG tablet Take 150 mg by mouth daily.     . isosorbide mononitrate (IMDUR) 30 MG 24 hr tablet Take 30 mg by mouth daily.     . Multiple Vitamin (MULTIVITAMIN WITH MINERALS) TABS tablet Take 1 tablet by mouth daily.     . Omega-3 Fatty Acids (OMEGA 3 500) 500 MG CAPS Take 500 mg by mouth daily.      Allergies  Allergen Reactions  . Hydrocodone Other (See Comments)    Severe hiccups    Social History   Tobacco Use  . Smoking status: Never Smoker  . Smokeless tobacco: Never Used  Substance Use Topics  . Alcohol use: No    Family History  Problem Relation Age of Onset  . Diabetes Mother   . Heart disease Father   . Asthma Son   . Diabetes Maternal Grandmother      Review of Systems  Constitutional: Negative.   HENT: Negative.   Eyes: Negative.   Respiratory: Negative.   Cardiovascular: Negative.   Gastrointestinal: Negative.   Endocrine: Negative.   Genitourinary: Negative.   Musculoskeletal: Positive for arthralgias, joint swelling and myalgias.  Allergic/Immunologic: Negative.   Neurological: Negative.   Hematological: Negative.   Psychiatric/Behavioral: Negative.     Objective:  Physical Exam  Constitutional: He is oriented to person, place, and time. He appears well-developed and well-nourished.  HENT:  Head: Normocephalic and atraumatic.  Neck: No JVD present. No tracheal deviation present. No thyromegaly present.  Cardiovascular: Normal rate, regular rhythm and intact distal pulses. Exam reveals friction rub. Exam reveals no gallop.  No murmur heard. Respiratory: Effort normal and breath sounds normal. No stridor. No respiratory distress. He has no wheezes. He has no rales. He exhibits no  tenderness.  GI: Soft. He exhibits no distension and no mass. There is no abdominal tenderness. There is no rebound and no guarding.  Musculoskeletal:     Cervical back: Normal range of motion and neck supple.     Comments: Tenderness with palpation over medial joint line Pain with ROM of knee Crepitus with ROM NVI in right lower extremity  Lymphadenopathy:    He has no cervical adenopathy.  Neurological: He is oriented to person, place, and time.  Skin: Skin is warm and dry. No rash noted. No erythema. No pallor.  Psychiatric: He has a normal mood and affect. His behavior is normal. Judgment and thought content normal.    Vital signs in last 24 hours:    Labs:  Estimated body mass index is 39.25 kg/m as calculated from the following:   Height as of 09/22/18: 5\' 7"  (1.702 m).   Weight as of 09/22/18: 113.7 kg.   Imaging Review Plain radiographs demonstrate severe degenerative joint disease of the right knee(s). The overall alignment issignificant varus. The bone quality appears to be fair for age and reported activity level.      Assessment/Plan:  End stage arthritis, right knee   The patient history, physical examination, clinical judgment of the provider and imaging studies are consistent with end stage degenerative joint disease of the right knee(s) and total knee arthroplasty is deemed medically necessary. The treatment options including medical management, injection therapy arthroscopy and arthroplasty were discussed at length. The risks and benefits of total knee arthroplasty were presented and reviewed. The risks due to aseptic loosening, infection, stiffness, patella tracking problems, thromboembolic complications and other imponderables were discussed. The patient acknowledged the explanation, agreed to proceed with the plan and consent was signed. Patient is being admitted for inpatient treatment for surgery, pain control, PT, OT, prophylactic antibiotics, VTE  prophylaxis, progressive ambulation and ADL's and discharge planning. The patient is planning to be discharged home with home health services Patient would like to do home health PT immediately after surgery.     Patient's anticipated LOS is less than 2 midnights, meeting these requirements: - Younger than 45 - Lives within 1 hour of care - Has a competent adult at home to recover with post-op recover - NO history of  - Chronic pain requiring opiods  - Diabetes  - Coronary Artery Disease  - Heart failure  - Heart attack  - Stroke  - DVT/VTE  - Cardiac arrhythmia  - Respiratory Failure/COPD  - Renal failure  - Anemia  - Advanced Liver disease

## 2019-10-10 NOTE — Progress Notes (Signed)
PCP - Dr. Leonia Corona Cardiologist - none  Chest x-ray - no EKG - 10/10/19 Stress Test - no ECHO - no Cardiac Cath - no  Sleep Study - no CPAP -   Fasting Blood Sugar - NA Checks Blood Sugar _____ times a day  Blood Thinner Instructions:ASA- preventative Aspirin Instructions:Dr. [name] said to stop 5 days prior Last Dose:2/28  Anesthesia review:   Patient denies shortness of breath, fever, cough and chest pain at PAT appointment yes  Patient verbalized understanding of instructions that were given to them at the PAT appointment. Patient was also instructed that they will need to review over the PAT instructions again at home before surgery. yes

## 2019-10-11 ENCOUNTER — Other Ambulatory Visit (HOSPITAL_COMMUNITY)
Admission: RE | Admit: 2019-10-11 | Discharge: 2019-10-11 | Disposition: A | Discharge status: Home / Self Care | Payer: 59 | Source: Ambulatory Visit | Attending: Specialist | Admitting: Specialist

## 2019-10-11 DIAGNOSIS — Z20822 Contact with and (suspected) exposure to covid-19: Secondary | ICD-10-CM | POA: Insufficient documentation

## 2019-10-11 DIAGNOSIS — Z01812 Encounter for preprocedural laboratory examination: Secondary | ICD-10-CM | POA: Diagnosis not present

## 2019-10-11 LAB — SARS CORONAVIRUS 2 (TAT 6-24 HRS): SARS Coronavirus 2: NEGATIVE

## 2019-10-12 NOTE — Progress Notes (Signed)
Anesthesia Chart Review   Case: [id number] Date/Time: 10/14/19 1020   Procedure: TOTAL KNEE ARTHROPLASTY (Right Knee) - adductor canal   Anesthesia type: Spinal   Pre-op diagnosis: Right knee osteoarthritis   [facility]   Surgeons: Sydnee Cabal, MD      DISCUSSION:64 y.o. never smoker with h/o HTN, CKD Stage IV, right knee OA scheduled for above procedure 10/14/2019 with Dr. Sydnee Cabal.   Pt with Stage IV CKD follows with nephrologist.  Last seen 09/07/19 (OV note on chart), stable at this visit.  "Avoid NSAIDS, IV contrast, fleets enamas, mag citrate, PPIs".  Creatinine 4.0 05/04/2019, Creatinine 3.92 09/07/2019.  Overall stable.   Anticipate pt can proceed with planned procedure barring acute status change.   VS: BP (!) 176/91   Pulse (!) 58   Temp 36.8 C (Oral)   Resp 16   Ht 5\' 7"  (1.702 m)   Wt 110.7 kg   SpO2 98%   BMI 38.22 kg/m   PROVIDERS: Mitchell, L.Marlou Sa, MD is PCP   Madelon Lips, MD is Nephrologist  LABS: Labs reviewed: Acceptable for surgery. (all labs ordered are listed, but only abnormal results are displayed)  Labs Reviewed  CBC - Abnormal; Notable for the following components:      Result Value   Hemoglobin 12.2 (*)    HCT 36.5 (*)    MCV 72.4 (*)    MCH 24.2 (*)    RDW 16.8 (*)    All other components within normal limits  COMPREHENSIVE METABOLIC PANEL - Abnormal; Notable for the following components:   BUN 75 (*)    Creatinine, Ser 4.09 (*)    Total Protein 8.5 (*)    GFR calc non Af Amer 15 (*)    GFR calc Af Amer 17 (*)    All other components within normal limits  SURGICAL PCR SCREEN     IMAGES:   EKG: 09/22/2018 Rate 61 bpm  Normal sinus rhythm    CV:  Past Medical History:  Diagnosis Date  . Arrhythmia 1991   patient  describes that he woke up surrounded by doctors saying that his "heart had stopped while sleeping"; denies having pain during this event; also denies any reccurrent issues after that  episode; says that was "when they told me i had high blood pressure"   . Arthritis   . Chronic kidney disease    stage IV managed by Dr Margo Aye kidney   . Headache   . Hypertension   . Umbilical hernia     Past Surgical History:  Procedure Laterality Date  . HERNIA REPAIR     inguinal   . INSERTION OF MESH N/A 01/21/2017   Procedure: INSERTION OF MESH;  Surgeon: Clovis Riley, MD;  [facility];  Service: General;  Laterality: N/A;  . JOINT REPLACEMENT    . KNEE ARTHROSCOPY    . TOTAL HIP ARTHROPLASTY Left 03/19/2017   Procedure: LEFT TOTAL HIP ARTHROPLASTY ANTERIOR APPROACH;  Surgeon: Rod Can, MD;  [facility];  Service: Orthopedics;  Laterality: Left;  Needs RNFA  . TOTAL KNEE ARTHROPLASTY  06/04/2012   Procedure: TOTAL KNEE ARTHROPLASTY;  Surgeon: Sydnee Cabal, MD;  [facility];  Service: Orthopedics;  Laterality: Left;  . UMBILICAL HERNIA REPAIR N/A 01/21/2017   Procedure: UMBILICAL HERNIA REPAIR WITH MESH;  Surgeon: Clovis Riley, MD;  Location[facility];  Service: General;  Laterality: N/A;    MEDICATIONS: . acetaminophen (TYLENOL) 650 MG  CR tablet  . amLODipine (NORVASC) 10 MG tablet  . Ascorbic Acid (VITAMIN C) 1000 MG tablet  . aspirin 81 MG chewable tablet  . atorvastatin (LIPITOR) 20 MG tablet  . calcitRIOL (ROCALTROL) 0.25 MCG capsule  . carvedilol (COREG) 25 MG tablet  . cloNIDine (CATAPRES) 0.3 MG tablet  . Coenzyme Q10 (COQ-10) 100 MG CAPS  . colchicine 0.6 MG tablet  . Febuxostat 80 MG TABS  . ferrous sulfate 325 (65 FE) MG tablet  . furosemide (LASIX) 40 MG tablet  . hydrALAZINE (APRESOLINE) 100 MG tablet  . irbesartan (AVAPRO) 150 MG tablet  . isosorbide mononitrate (IMDUR) 30 MG 24 hr tablet  . Multiple Vitamin (MULTIVITAMIN WITH MINERALS) TABS tablet  . Omega-3 Fatty Acids (OMEGA 3 500) 500 MG CAPS   No current facility-administered medications for this encounter.    Maia Plan [facility] Pre-Surgical  Testing [phone] 10/12/19  11:44 AM

## 2019-10-13 MED ORDER — BUPIVACAINE LIPOSOME 1.3 % IJ SUSP
20.0000 mL | Freq: Once | INTRAMUSCULAR | Status: DC
  Filled 2019-10-13: qty 20

## 2019-10-14 ENCOUNTER — Observation Stay (HOSPITAL_COMMUNITY): Payer: 59 | Admitting: Certified Registered"

## 2019-10-14 ENCOUNTER — Encounter (HOSPITAL_COMMUNITY)
Admission: RE | Disposition: A | Discharge status: Home Health | Payer: Self-pay | Source: Home / Self Care | Attending: Specialist

## 2019-10-14 ENCOUNTER — Other Ambulatory Visit: Payer: Self-pay

## 2019-10-14 ENCOUNTER — Observation Stay (HOSPITAL_COMMUNITY): Payer: 59 | Admitting: Physician Assistant

## 2019-10-14 ENCOUNTER — Encounter (HOSPITAL_COMMUNITY): Payer: Self-pay | Admitting: Specialist

## 2019-10-14 ENCOUNTER — Observation Stay (HOSPITAL_COMMUNITY)
Admission: RE | Admit: 2019-10-14 | Discharge: 2019-10-16 | Disposition: A | Discharge status: Home Health | Payer: 59 | Attending: Specialist | Admitting: Specialist

## 2019-10-14 DIAGNOSIS — I130 Hypertensive heart and chronic kidney disease with heart failure and stage 1 through stage 4 chronic kidney disease, or unspecified chronic kidney disease: Secondary | ICD-10-CM | POA: Insufficient documentation

## 2019-10-14 DIAGNOSIS — Z7982 Long term (current) use of aspirin: Secondary | ICD-10-CM | POA: Diagnosis not present

## 2019-10-14 DIAGNOSIS — M1711 Unilateral primary osteoarthritis, right knee: Secondary | ICD-10-CM | POA: Diagnosis present

## 2019-10-14 DIAGNOSIS — Z885 Allergy status to narcotic agent status: Secondary | ICD-10-CM | POA: Insufficient documentation

## 2019-10-14 DIAGNOSIS — I129 Hypertensive chronic kidney disease with stage 1 through stage 4 chronic kidney disease, or unspecified chronic kidney disease: Secondary | ICD-10-CM | POA: Insufficient documentation

## 2019-10-14 DIAGNOSIS — Z8249 Family history of ischemic heart disease and other diseases of the circulatory system: Secondary | ICD-10-CM | POA: Insufficient documentation

## 2019-10-14 DIAGNOSIS — N184 Chronic kidney disease, stage 4 (severe): Secondary | ICD-10-CM | POA: Insufficient documentation

## 2019-10-14 DIAGNOSIS — M1612 Unilateral primary osteoarthritis, left hip: Secondary | ICD-10-CM | POA: Diagnosis not present

## 2019-10-14 DIAGNOSIS — Z79899 Other long term (current) drug therapy: Secondary | ICD-10-CM | POA: Insufficient documentation

## 2019-10-14 HISTORY — PX: TOTAL KNEE ARTHROPLASTY: SHX125

## 2019-10-14 LAB — CBC
HCT: 33.5 % — ABNORMAL LOW (ref 39.0–52.0)
Hemoglobin: 11 g/dL — ABNORMAL LOW (ref 13.0–17.0)
MCH: 23.9 pg — ABNORMAL LOW (ref 26.0–34.0)
MCHC: 32.8 g/dL (ref 30.0–36.0)
MCV: 72.8 fL — ABNORMAL LOW (ref 80.0–100.0)
Platelets: 286 10*3/uL (ref 150–400)
RBC: 4.6 MIL/uL (ref 4.22–5.81)
RDW: 16.3 % — ABNORMAL HIGH (ref 11.5–15.5)
WBC: 8.2 10*3/uL (ref 4.0–10.5)
nRBC: 0 % (ref 0.0–0.2)

## 2019-10-14 LAB — CREATININE, SERUM
Creatinine, Ser: 4.42 mg/dL — ABNORMAL HIGH (ref 0.61–1.24)
GFR calc Af Amer: 15 mL/min — ABNORMAL LOW (ref >60)
GFR calc non Af Amer: 13 mL/min — ABNORMAL LOW (ref >60)

## 2019-10-14 LAB — TYPE AND SCREEN
ABO/RH(D): O POS
Antibody Screen: NEGATIVE

## 2019-10-14 LAB — HIV ANTIBODY (ROUTINE TESTING W REFLEX): HIV Screen 4th Generation wRfx: NONREACTIVE

## 2019-10-14 LAB — APTT: aPTT: 23 seconds — ABNORMAL LOW (ref 24–36)

## 2019-10-14 LAB — PROTIME-INR
INR: 1 (ref 0.8–1.2)
Prothrombin Time: 13.3 seconds (ref 11.4–15.2)

## 2019-10-14 SURGERY — ARTHROPLASTY, KNEE, TOTAL
Anesthesia: Spinal | Site: Knee | Laterality: Right

## 2019-10-14 MED ORDER — CEFAZOLIN SODIUM-DEXTROSE 1-4 GM/50ML-% IV SOLN
1.0000 g | Freq: Three times a day (TID) | INTRAVENOUS | Status: AC
  Administered 2019-10-14 – 2019-10-15 (×3): 1 g via INTRAVENOUS
  Filled 2019-10-14 (×3): qty 50

## 2019-10-14 MED ORDER — FENTANYL CITRATE (PF) 100 MCG/2ML IJ SOLN
INTRAMUSCULAR | Status: AC
  Filled 2019-10-14: qty 4

## 2019-10-14 MED ORDER — GABAPENTIN 300 MG PO CAPS
300.0000 mg | ORAL_CAPSULE | Freq: Three times a day (TID) | ORAL | Status: DC
  Administered 2019-10-14: 300 mg via ORAL
  Filled 2019-10-14: qty 1

## 2019-10-14 MED ORDER — OXYCODONE HCL 5 MG/5ML PO SOLN: 5.0000 mg | Freq: Once | ORAL | Status: DC | PRN

## 2019-10-14 MED ORDER — METHOCARBAMOL 500 MG PO TABS: 500.0000 mg | ORAL_TABLET | Freq: Four times a day (QID) | ORAL | 0 refills | Status: DC

## 2019-10-14 MED ORDER — GABAPENTIN 300 MG PO CAPS
300.0000 mg | ORAL_CAPSULE | Freq: Every day | ORAL | Status: DC
  Administered 2019-10-14 – 2019-10-15 (×2): 300 mg via ORAL
  Filled 2019-10-14 (×2): qty 1

## 2019-10-14 MED ORDER — ONDANSETRON HCL 4 MG/2ML IJ SOLN
4.0000 mg | Freq: Four times a day (QID) | INTRAMUSCULAR | Status: DC | PRN
  Administered 2019-10-15: 4 mg via INTRAVENOUS
  Filled 2019-10-14: qty 2

## 2019-10-14 MED ORDER — SODIUM CHLORIDE 0.9 % IV SOLN: INTRAVENOUS | Status: DC

## 2019-10-14 MED ORDER — TRANEXAMIC ACID-NACL 1000-0.7 MG/100ML-% IV SOLN
1000.0000 mg | INTRAVENOUS | Status: AC
  Administered 2019-10-14: 1000 mg via INTRAVENOUS
  Filled 2019-10-14: qty 100

## 2019-10-14 MED ORDER — ONDANSETRON HCL 4 MG/2ML IJ SOLN
INTRAMUSCULAR | Status: DC | PRN
  Administered 2019-10-14: 4 mg via INTRAVENOUS

## 2019-10-14 MED ORDER — SODIUM CHLORIDE (PF) 0.9 % IJ SOLN
INTRAMUSCULAR | Status: AC
  Filled 2019-10-14: qty 10

## 2019-10-14 MED ORDER — DIPHENHYDRAMINE HCL 12.5 MG/5ML PO ELIX: 12.5000 mg | ORAL_SOLUTION | ORAL | Status: DC | PRN

## 2019-10-14 MED ORDER — OXYCODONE HCL 5 MG PO TABS: 5.0000 mg | ORAL_TABLET | ORAL | 0 refills | Status: AC | PRN

## 2019-10-14 MED ORDER — SODIUM CHLORIDE 0.9 % IR SOLN
Status: DC | PRN
  Administered 2019-10-14: 1000 mL

## 2019-10-14 MED ORDER — OXYCODONE HCL 5 MG PO TABS: 5.0000 mg | ORAL_TABLET | Freq: Once | ORAL | Status: DC | PRN

## 2019-10-14 MED ORDER — BISACODYL 5 MG PO TBEC: 5.0000 mg | DELAYED_RELEASE_TABLET | Freq: Every day | ORAL | Status: DC | PRN

## 2019-10-14 MED ORDER — SODIUM CHLORIDE (PF) 0.9 % IJ SOLN
INTRAMUSCULAR | Status: DC | PRN
  Administered 2019-10-14: 60 mL

## 2019-10-14 MED ORDER — MAGNESIUM CITRATE PO SOLN: 1.0000 | Freq: Once | ORAL | Status: DC | PRN

## 2019-10-14 MED ORDER — ROPIVACAINE HCL 5 MG/ML IJ SOLN
INTRAMUSCULAR | Status: DC | PRN
  Administered 2019-10-14: 30 mL via PERINEURAL

## 2019-10-14 MED ORDER — TRAMADOL HCL 50 MG PO TABS
50.0000 mg | ORAL_TABLET | Freq: Two times a day (BID) | ORAL | Status: DC
  Administered 2019-10-14 – 2019-10-16 (×4): 50 mg via ORAL
  Filled 2019-10-14 (×4): qty 1

## 2019-10-14 MED ORDER — FENTANYL CITRATE (PF) 100 MCG/2ML IJ SOLN
25.0000 ug | INTRAMUSCULAR | Status: DC | PRN
  Administered 2019-10-14 (×3): 50 ug via INTRAVENOUS

## 2019-10-14 MED ORDER — BUPIVACAINE LIPOSOME 1.3 % IJ SUSP
INTRAMUSCULAR | Status: DC | PRN
  Administered 2019-10-14: 20 mL

## 2019-10-14 MED ORDER — ONDANSETRON HCL 4 MG PO TABS: 4.0000 mg | ORAL_TABLET | Freq: Every day | ORAL | 1 refills | Status: AC | PRN

## 2019-10-14 MED ORDER — OXYCODONE HCL 5 MG PO TABS
10.0000 mg | ORAL_TABLET | ORAL | Status: DC | PRN
  Administered 2019-10-14 – 2019-10-15 (×3): 15 mg via ORAL
  Filled 2019-10-14 (×3): qty 3

## 2019-10-14 MED ORDER — PROPOFOL 1000 MG/100ML IV EMUL
INTRAVENOUS | Status: AC
  Filled 2019-10-14: qty 100

## 2019-10-14 MED ORDER — PROPOFOL 10 MG/ML IV BOLUS
INTRAVENOUS | Status: DC | PRN
  Administered 2019-10-14: 30 mg via INTRAVENOUS

## 2019-10-14 MED ORDER — ACETAMINOPHEN 325 MG PO TABS
325.0000 mg | ORAL_TABLET | Freq: Four times a day (QID) | ORAL | Status: DC | PRN
  Administered 2019-10-15 – 2019-10-16 (×2): 650 mg via ORAL
  Filled 2019-10-14 (×2): qty 2

## 2019-10-14 MED ORDER — FENTANYL CITRATE (PF) 100 MCG/2ML IJ SOLN
50.0000 ug | Freq: Once | INTRAMUSCULAR | Status: AC
  Administered 2019-10-14: 50 ug via INTRAVENOUS

## 2019-10-14 MED ORDER — ENOXAPARIN SODIUM 40 MG/0.4ML ~~LOC~~ SOLN
40.0000 mg | SUBCUTANEOUS | Status: DC
  Administered 2019-10-15 – 2019-10-16 (×2): 40 mg via SUBCUTANEOUS
  Filled 2019-10-14 (×2): qty 0.4

## 2019-10-14 MED ORDER — OXYCODONE HCL 5 MG PO TABS
5.0000 mg | ORAL_TABLET | ORAL | Status: DC | PRN
  Administered 2019-10-14: 5 mg via ORAL
  Administered 2019-10-15 – 2019-10-16 (×4): 10 mg via ORAL
  Filled 2019-10-14 (×3): qty 2
  Filled 2019-10-14: qty 1
  Filled 2019-10-14: qty 2

## 2019-10-14 MED ORDER — METHOCARBAMOL 500 MG PO TABS
500.0000 mg | ORAL_TABLET | Freq: Four times a day (QID) | ORAL | Status: DC | PRN
  Administered 2019-10-14 – 2019-10-15 (×3): 500 mg via ORAL
  Filled 2019-10-14 (×3): qty 1

## 2019-10-14 MED ORDER — METHOCARBAMOL 500 MG IVPB - SIMPLE MED
500.0000 mg | Freq: Four times a day (QID) | INTRAVENOUS | Status: DC | PRN
  Filled 2019-10-14: qty 50

## 2019-10-14 MED ORDER — PROPOFOL 500 MG/50ML IV EMUL
INTRAVENOUS | Status: DC | PRN
  Administered 2019-10-14: 75 ug/kg/min via INTRAVENOUS

## 2019-10-14 MED ORDER — FENTANYL CITRATE (PF) 100 MCG/2ML IJ SOLN
50.0000 ug | Freq: Once | INTRAMUSCULAR | Status: AC
  Administered 2019-10-14: 50 ug via INTRAVENOUS
  Filled 2019-10-14: qty 2

## 2019-10-14 MED ORDER — CHLORHEXIDINE GLUCONATE 4 % EX LIQD: 60.0000 mL | Freq: Once | CUTANEOUS | Status: DC

## 2019-10-14 MED ORDER — SENNOSIDES-DOCUSATE SODIUM 8.6-50 MG PO TABS: 1.0000 | ORAL_TABLET | Freq: Every evening | ORAL | Status: DC | PRN

## 2019-10-14 MED ORDER — ONDANSETRON HCL 4 MG/2ML IJ SOLN: 4.0000 mg | Freq: Once | INTRAMUSCULAR | Status: DC | PRN

## 2019-10-14 MED ORDER — SODIUM CHLORIDE (PF) 0.9 % IJ SOLN
INTRAMUSCULAR | Status: AC
  Filled 2019-10-14: qty 50

## 2019-10-14 MED ORDER — POVIDONE-IODINE 10 % EX SWAB
2.0000 "application " | Freq: Once | CUTANEOUS | Status: AC
  Administered 2019-10-14: 2 via TOPICAL

## 2019-10-14 MED ORDER — ONDANSETRON HCL 4 MG PO TABS: 4.0000 mg | ORAL_TABLET | Freq: Four times a day (QID) | ORAL | Status: DC | PRN

## 2019-10-14 MED ORDER — PHENYLEPHRINE 40 MCG/ML (10ML) SYRINGE FOR IV PUSH (FOR BLOOD PRESSURE SUPPORT)
PREFILLED_SYRINGE | INTRAVENOUS | Status: DC | PRN
  Administered 2019-10-14: 120 ug via INTRAVENOUS

## 2019-10-14 MED ORDER — LACTATED RINGERS IV SOLN: INTRAVENOUS | Status: DC

## 2019-10-14 MED ORDER — STERILE WATER FOR IRRIGATION IR SOLN
Status: DC | PRN
  Administered 2019-10-14: 2000 mL

## 2019-10-14 MED ORDER — LACTATED RINGERS IV SOLN: INTRAVENOUS | Status: DC | PRN

## 2019-10-14 MED ORDER — PROPOFOL 500 MG/50ML IV EMUL
INTRAVENOUS | Status: AC
  Filled 2019-10-14: qty 50

## 2019-10-14 MED ORDER — DEXAMETHASONE SODIUM PHOSPHATE 10 MG/ML IJ SOLN
8.0000 mg | Freq: Once | INTRAMUSCULAR | Status: AC
  Administered 2019-10-14: 8 mg via INTRAVENOUS

## 2019-10-14 MED ORDER — CHLORHEXIDINE GLUCONATE CLOTH 2 % EX PADS
6.0000 | MEDICATED_PAD | Freq: Every day | CUTANEOUS | Status: DC
  Administered 2019-10-15: 6 via TOPICAL

## 2019-10-14 MED ORDER — ACETAMINOPHEN 500 MG PO TABS
1000.0000 mg | ORAL_TABLET | Freq: Four times a day (QID) | ORAL | Status: AC
  Administered 2019-10-14 – 2019-10-15 (×4): 1000 mg via ORAL
  Filled 2019-10-14 (×4): qty 2

## 2019-10-14 MED ORDER — MIDAZOLAM HCL 2 MG/2ML IJ SOLN
1.0000 mg | Freq: Once | INTRAMUSCULAR | Status: AC
  Administered 2019-10-14: 2 mg via INTRAVENOUS
  Filled 2019-10-14: qty 2

## 2019-10-14 MED ORDER — IRRISEPT - 450ML BOTTLE WITH 0.05% CHG IN STERILE WATER, USP 99.95% OPTIME
TOPICAL | Status: DC | PRN
  Administered 2019-10-14: 13:00:00 200 mL

## 2019-10-14 MED ORDER — CEFAZOLIN SODIUM-DEXTROSE 2-4 GM/100ML-% IV SOLN
2.0000 g | INTRAVENOUS | Status: AC
  Administered 2019-10-14: 2 g via INTRAVENOUS
  Filled 2019-10-14: qty 100

## 2019-10-14 MED ORDER — HYDROMORPHONE HCL 1 MG/ML IJ SOLN
0.5000 mg | INTRAMUSCULAR | Status: DC | PRN
  Administered 2019-10-15: 1 mg via INTRAVENOUS
  Filled 2019-10-14: qty 1

## 2019-10-14 MED ORDER — 0.9 % SODIUM CHLORIDE (POUR BTL) OPTIME
TOPICAL | Status: DC | PRN
  Administered 2019-10-14: 1000 mL

## 2019-10-14 SURGICAL SUPPLY — 70 items
ADH SKN CLS APL DERMABOND .7 (GAUZE/BANDAGES/DRESSINGS) ×1
ATTUNE PS FEM RT SZ 6 CEM KNEE (Femur) ×1 IMPLANT
ATTUNE PSRP INSR SZ6 10 KNEE (Insert) ×1 IMPLANT
BAG SPEC THK2 15X12 ZIP CLS (MISCELLANEOUS) ×2
BAG ZIPLOCK 12X15 (MISCELLANEOUS) ×4 IMPLANT
BASE TIBIAL ROT PLAT SZ 7 KNEE (Knees) IMPLANT
BLADE SAG 18X100X1.27 (BLADE) ×2 IMPLANT
BLADE SAW SGTL 11.0X1.19X90.0M (BLADE) ×2 IMPLANT
BNDG ELASTIC 4X5.8 VLCR STR LF (GAUZE/BANDAGES/DRESSINGS) ×2 IMPLANT
BNDG ELASTIC 6X5.8 VLCR STR LF (GAUZE/BANDAGES/DRESSINGS) ×2 IMPLANT
BOWL SMART MIX CTS (DISPOSABLE) ×2 IMPLANT
BSPLAT TIB 7 CMNT ROT PLAT STR (Knees) ×1 IMPLANT
CEMENT HV SMART SET (Cement) ×2 IMPLANT
COVER SURGICAL LIGHT HANDLE (MISCELLANEOUS) ×2 IMPLANT
COVER WAND RF STERILE (DRAPES) IMPLANT
CUFF TOURN SGL QUICK 34 (TOURNIQUET CUFF) ×2
CUFF TRNQT CYL 34X4.125X (TOURNIQUET CUFF) ×1 IMPLANT
DECANTER SPIKE VIAL GLASS SM (MISCELLANEOUS) ×2 IMPLANT
DERMABOND ADVANCED (GAUZE/BANDAGES/DRESSINGS) ×1
DERMABOND ADVANCED .7 DNX12 (GAUZE/BANDAGES/DRESSINGS) ×1 IMPLANT
DRAPE U-SHAPE 47X51 STRL (DRAPES) ×2 IMPLANT
DRSG AQUACEL AG ADV 3.5X10 (GAUZE/BANDAGES/DRESSINGS) ×2 IMPLANT
DRSG TEGADERM 4X4.75 (GAUZE/BANDAGES/DRESSINGS) ×2 IMPLANT
DURAPREP 26ML APPLICATOR (WOUND CARE) ×4 IMPLANT
ELECT REM PT RETURN 15FT ADLT (MISCELLANEOUS) ×2 IMPLANT
EVACUATOR 1/8 PVC DRAIN (DRAIN) ×2 IMPLANT
GAUZE SPONGE 2X2 8PLY STRL LF (GAUZE/BANDAGES/DRESSINGS) ×1 IMPLANT
GLOVE BIOGEL PI IND STRL 7.5 (GLOVE) ×1 IMPLANT
GLOVE BIOGEL PI IND STRL 8 (GLOVE) ×1 IMPLANT
GLOVE BIOGEL PI INDICATOR 7.5 (GLOVE) ×1
GLOVE BIOGEL PI INDICATOR 8 (GLOVE) ×1
GLOVE ECLIPSE 8.0 STRL XLNG CF (GLOVE) ×2 IMPLANT
GLOVE SURG ORTHO 9.0 STRL STRW (GLOVE) ×2 IMPLANT
GLOVE SURG SS PI 7.0 STRL IVOR (GLOVE) ×2 IMPLANT
GOWN STRL REUS W/TWL XL LVL3 (GOWN DISPOSABLE) ×4 IMPLANT
HANDPIECE INTERPULSE COAX TIP (DISPOSABLE) ×2
HOLDER FOLEY CATH W/STRAP (MISCELLANEOUS) IMPLANT
JET LAVAGE IRRISEPT WOUND (IRRIGATION / IRRIGATOR) ×2
KIT TURNOVER KIT A (KITS) IMPLANT
LAVAGE JET IRRISEPT WOUND (IRRIGATION / IRRIGATOR) ×1 IMPLANT
NS IRRIG 1000ML POUR BTL (IV SOLUTION) ×2 IMPLANT
PACK TOTAL KNEE CUSTOM (KITS) ×2 IMPLANT
PATELLA MEDIAL ATTUN 35MM KNEE (Knees) ×1 IMPLANT
PENCIL SMOKE EVACUATOR (MISCELLANEOUS) IMPLANT
PIN DRILL FIX HALF THREAD (BIT) ×1 IMPLANT
PIN STEINMAN FIXATION KNEE (PIN) ×1 IMPLANT
PROTECTOR NERVE ULNAR (MISCELLANEOUS) ×2 IMPLANT
SET HNDPC FAN SPRY TIP SCT (DISPOSABLE) ×1 IMPLANT
SET PAD KNEE POSITIONER (MISCELLANEOUS) ×2 IMPLANT
SPONGE GAUZE 2X2 STER 10/PKG (GAUZE/BANDAGES/DRESSINGS) ×1
SPONGE LAP 18X18 RF (DISPOSABLE) IMPLANT
SPONGE SURGIFOAM ABS GEL 100 (HEMOSTASIS) ×2 IMPLANT
STOCKINETTE 6  STRL (DRAPES) ×2
STOCKINETTE 6 STRL (DRAPES) ×1 IMPLANT
SUT BONE WAX W31G (SUTURE) IMPLANT
SUT MNCRL AB 3-0 PS2 18 (SUTURE) ×2 IMPLANT
SUT VIC AB 1 CT1 27 (SUTURE) ×6
SUT VIC AB 1 CT1 27XBRD ANTBC (SUTURE) ×3 IMPLANT
SUT VIC AB 1 CT1 36 (SUTURE) ×1 IMPLANT
SUT VIC AB 2-0 CT1 27 (SUTURE) ×4
SUT VIC AB 2-0 CT1 TAPERPNT 27 (SUTURE) ×2 IMPLANT
SUT VLOC 180 0 24IN GS25 (SUTURE) ×2 IMPLANT
SYR 50ML LL SCALE MARK (SYRINGE) IMPLANT
TAPE STRIPS DRAPE STRL (GAUZE/BANDAGES/DRESSINGS) ×2 IMPLANT
TIBIAL BASE ROT PLAT SZ 7 KNEE (Knees) ×2 IMPLANT
TRAY CATH 16FR W/PLASTIC CATH (SET/KITS/TRAYS/PACK) ×2 IMPLANT
TRAY FOLEY MTR SLVR 16FR STAT (SET/KITS/TRAYS/PACK) ×1 IMPLANT
WATER STERILE IRR 1000ML POUR (IV SOLUTION) ×4 IMPLANT
WRAP KNEE MAXI GEL POST OP (GAUZE/BANDAGES/DRESSINGS) ×2 IMPLANT
YANKAUER SUCT BULB TIP 10FT TU (MISCELLANEOUS) ×2 IMPLANT

## 2019-10-14 NOTE — Evaluation (Signed)
Physical Therapy Evaluation Patient Details Name: Marvin Garcia MRN: [id number] DOB: [birth year] Today's Date: 10/14/2019   History of Present Illness  Patient is 64 y.o. male s/p Rt TKA on 10/14/19 with PMH significant for  HTN, CKD, OA, Lt THA, Lt TKA..  Clinical Impression  Marvin Garcia is a 64 y.o. male POD 0 s/p Rt TKA. Patient reports independence with mobility at baseline. Patient is now limited by functional impairments (see PT problem list below) and requires min assist for transfers and gait with RW. Patient was able to ambulate ~80 feet with RW and min assist. Patient instructed in exercise to facilitate ROM and circulation. Patient will benefit from continued skilled PT interventions to address impairments and progress towards PLOF. Acute PT will follow to progress mobility and stair training in preparation for safe discharge home.     Follow Up Recommendations Follow surgeon's recommendation for DC plan and follow-up therapies(pt getting HHPT before OPPT)    Equipment Recommendations       Recommendations for Other Services       Precautions / Restrictions Precautions Precautions: Fall Restrictions Weight Bearing Restrictions: No      Mobility  Bed Mobility Overal bed mobility: Needs Assistance Bed Mobility: Supine to Sit     Supine to sit: Min assist;HOB elevated     General bed mobility comments: cues required for use of bed rail and assist to bring Rt LE to EOB. pt taking extra time to scoot to EOB and place feet on floor.  Transfers Overall transfer level: Needs assistance Equipment used: Rolling walker (2 wheeled) Transfers: Sit to/from Stand Sit to Stand: Min assist;From elevated surface         General transfer comment: cues for hand placement and technique with RW, assist required to complete power up and steady with rising.  Ambulation/Gait Ambulation/Gait assistance: Min assist Gait Distance (Feet): 80 Feet Assistive device: Rolling walker  (2 wheeled) Gait Pattern/deviations: Step-to pattern;Decreased stride length Gait velocity: fair   General Gait Details: cues at start for safe proximity to RW and sequencing step pattern, pt maintained throughout. Cues intermittently to keep RW on ground and min assist to manage in turns. no overt LOB noted.  Stairs            Wheelchair Mobility    Modified Rankin (Stroke Patients Only)       Balance Overall balance assessment: Needs assistance Sitting-balance support: Feet supported Sitting balance-Leahy Scale: Good     Standing balance support: Bilateral upper extremity supported;During functional activity Standing balance-Leahy Scale: Poor                Pertinent Vitals/Pain Pain Assessment: 0-10 Pain Score: 2  Pain Location: Rt knee Pain Descriptors / Indicators: Aching Pain Intervention(s): Limited activity within patient's tolerance;Monitored during session;Repositioned    Home Living Family/patient expects to be discharged to:: Private residence Living Arrangements: Spouse/significant other Available Help at Discharge: Family;Available PRN/intermittently Type of Home: House Home Access: Stairs to enter Entrance Stairs-Rails: None Entrance Stairs-Number of Steps: 1+1 (short steps) Home Layout: One level Home Equipment: [place] - 2 wheels;Cane - single point;Bedside commode      Prior Function Level of Independence: Independent         Comments: pt works full time for [employer]   Dominant Hand: Right    Extremity/Trunk Assessment   Upper Extremity Assessment Upper Extremity Assessment: Overall WFL for tasks assessed    Lower Extremity Assessment Lower Extremity Assessment:  Overall WFL for tasks assessed;RLE deficits/detail RLE Deficits / Details: pt with good quad activation, 4/5 for quad strength with MMT RLE Sensation: WNL RLE Coordination: WNL    Cervical / Trunk Assessment Cervical / Trunk  Assessment: Normal  Communication   Communication: No difficulties  Cognition Arousal/Alertness: Awake/alert Behavior During Therapy: WFL for tasks assessed/performed Overall Cognitive Status: Within Functional Limits for tasks assessed             General Comments      Exercises Total Joint Exercises Ankle Circles/Pumps: AROM;10 reps;Seated;Both Quad Sets: AROM;5 reps;Right Heel Slides: AROM;5 reps;Seated;Right   Assessment/Plan    PT Assessment Patient needs continued PT services  PT Problem List Decreased strength;Decreased range of motion;Decreased activity tolerance;Decreased balance;Decreased mobility;Decreased knowledge of use of DME       PT Treatment Interventions DME instruction;Gait training;Stair training;Functional mobility training;Therapeutic activities;Therapeutic exercise;Balance training;Patient/family education    PT Goals (Current goals can be found in the Care Plan section)  Acute Rehab PT Goals Patient Stated Goal: pt didn't state specific goal, discussed walking mroe independent and returning to work PT Goal Formulation: With patient Time For Goal Achievement: 10/21/19 Potential to Achieve Goals: Good    Frequency 7X/week    AM-PAC PT "6 Clicks" Mobility  Outcome Measure Help needed turning from your back to your side while in a flat bed without using bedrails?: A Little Help needed moving from lying on your back to sitting on the side of a flat bed without using bedrails?: A Little Help needed moving to and from a bed to a chair (including a wheelchair)?: A Little Help needed standing up from a chair using your arms (e.g., wheelchair or bedside chair)?: A Little Help needed to walk in hospital room?: A Little Help needed climbing 3-5 steps with a railing? : A Little 6 Click Score: 18    End of Session Equipment Utilized During Treatment: Gait belt Activity Tolerance: Patient tolerated treatment well Patient left: in chair;with call  bell/phone within reach;with chair alarm set;with family/visitor present Nurse Communication: Mobility status PT Visit Diagnosis: Muscle weakness (generalized) (M62.81);Difficulty in walking, not elsewhere classified (R26.2)    Time: 5277-8242 PT Time Calculation (min) (ACUTE ONLY): 30 min   Charges:   PT Evaluation $PT Eval Low Complexity: 1 Low PT Treatments $Therapeutic Exercise: 8-22 mins        Verner Mould, DPT Physical Therapist with [facility] [phone]  10/14/2019 8:30 PM

## 2019-10-14 NOTE — Anesthesia Postprocedure Evaluation (Signed)
Anesthesia Post Note  Patient: Marvin Garcia  Procedure(s) Performed: TOTAL KNEE ARTHROPLASTY (Right Knee)     Patient location during evaluation: PACU Anesthesia Type: Spinal Level of consciousness: awake and sedated Pain management: pain level controlled Respiratory status: spontaneous breathing Cardiovascular status: stable Postop Assessment: no headache, no backache, spinal receding, patient able to bend at knees and no apparent nausea or vomiting Anesthetic complications: no    Last Vitals:  Vitals:   10/14/19 1400 10/14/19 1415  BP: 139/87 (!) 145/77  Pulse: (!) 52 (!) 57  Resp: 15 13  Temp:    SpO2: 96% 96%    Last Pain:  Vitals:   10/14/19 1415  TempSrc:   PainSc: 3    Pain Goal:    LLE Motor Response: Purposeful movement (10/14/19 1400)   RLE Motor Response: Purposeful movement (10/14/19 1400)   L Sensory Level: S1-Sole of foot, small toes (10/14/19 1400) R Sensory Level: S1-Sole of foot, small toes (10/14/19 1400) Epidural/Spinal Function Patient able to flex knees: Yes (10/14/19 1400), Patient able to lift hips off bed: No (10/14/19 1400), Back pain beyond tenderness at insertion site: No (10/14/19 1400), Progressively worsening motor and/or sensory loss: No (10/14/19 1400), Bowel and/or bladder incontinence post epidural: No (10/14/19 1400)  Huston Foley

## 2019-10-14 NOTE — Op Note (Signed)
DATE OF SURGERY:  10/14/2019  TIME: 1:37 PM  PATIENT NAME:  Marvin Garcia    AGE: 64 y.o.   PRE-OPERATIVE DIAGNOSIS:  Right knee osteoarthritis  POST-OPERATIVE DIAGNOSIS:  Right knee osteoarthritis  PROCEDURE:  Procedure(s): TOTAL KNEE ARTHROPLASTY  SURGEON:  Teandra Harlan ANDREW  ASSISTANT:  Leeanne Haus, PA-C, present and scrubbed throughout the case, critical for assistance with exposure, retraction, instrumentation, and closure.  OPERATIVE IMPLANTS: Depuy PFC Attune Rotating Platform.  Femur size 6, Tibia size 7, Patella size 35 3-peg oval button, with a 10 mm polyethylene insert.   PREOPERATIVE INDICATIONS:   Marvin Garcia is a 64 y.o. year old male with end stage bone on bone arthritis of the knee who failed conservative treatment and elected for Total Knee Arthroplasty.   The risks, benefits, and alternatives were discussed at length including but not limited to the risks of infection, bleeding, nerve injury, stiffness, blood clots, the need for revision surgery, cardiopulmonary complications, among others, and they were willing to proceed.  OPERATIVE DESCRIPTION:  The patient was brought to the operative room and placed in a supine position.  Spinal anesthesia was administered.  IV antibiotics were given.  The lower extremity was prepped and draped in the usual sterile fashion.  Time out was performed.  The leg was elevated and exsanguinated and the tourniquet was inflated.  Anterior quadriceps tendon splitting approach was performed.  The patella was retracted and osteophytes were removed.  The anterior horn of the medial and lateral meniscus was removed and cruciate ligaments resected.   The distal femur was opened with the drill and the intramedullary distal femoral cutting jig was utilized, set at 5 degrees resecting 10 mm off the distal femur.  Care was taken to protect the collateral ligaments.  The distal femoral sizing jig was applied, taking care to avoid  notching.  Then the 4-in-1 cutting jig was applied and the anterior and posterior femur was cut, along with the chamfer cuts.    Then the extramedullary tibial cutting jig was utilized making the appropriate cut using the anterior tibial crest as a reference building in appropriate posterior slope.  Care was taken during the cut to protect the medial and collateral ligaments.  The proximal tibia was removed along with the posterior horns of the menisci.   The posterior medial femoral osteophytes and posterior lateral femoral osteophytes were removed.    The flexion gap was then measured and was symmetric with the extension gap, measured at 10.  I completed the distal femoral preparation using the appropriate jig to prepare the box.  The patella was then measured, and cut with the saw.    The proximal tibia sized and prepared accordingly with the reamer and the punch, and then all components were trialed with the trial insert.  The knee was found to have excellent balance and full motion.    The above named components were then cemented into place and all excess cement was removed.  The trial polyethylene component was in place during cementation, and then was exchanged for the real polyethylene component.    The knee was easily taken through a range of motion and the patella tracked well and the knee irrigated copiously and the parapatellar and subcutaneous tissue closed with vicryl, and monocryl with steri strips for the skin.  The arthrotomy was closed at 90 of flexion. The wounds were dressed with sterile gauze and the tourniquet released and the patient was awakened and returned to the PACU in stable and  satisfactory condition.  There were no complications.  Total tourniquet time was 80 minutes.

## 2019-10-14 NOTE — Anesthesia Procedure Notes (Signed)
Procedure Name: MAC Date/Time: 10/14/2019 11:06 AM Performed by: Niel Hummer, CRNA Pre-anesthesia Checklist: Patient identified, Emergency Drugs available, Suction available, Patient being monitored and Timeout performed Patient Re-evaluated:Patient Re-evaluated prior to induction Oxygen Delivery Method: Simple face mask Dental Injury: Teeth and Oropharynx as per pre-operative assessment

## 2019-10-14 NOTE — H&P (View-Only) (Signed)
AssistedDr. Carolyn Witman with right, ultrasound guided, adductor canal block. Side rails up, monitors on throughout procedure. See vital signs in flow sheet. Tolerated Procedure well.  

## 2019-10-14 NOTE — Anesthesia Preprocedure Evaluation (Addendum)
Anesthesia Evaluation  Patient identified by MRN, date of birth, ID band Patient awake    Reviewed: Allergy & Precautions, NPO status , Patient's Chart, lab work & pertinent test results  History of Anesthesia Complications Negative for: history of anesthetic complications  Airway Mallampati: II  TM Distance: >3 FB Neck ROM: Full    Dental  (+) Teeth Intact   Pulmonary neg pulmonary ROS,    Pulmonary exam normal        Cardiovascular hypertension, Normal cardiovascular exam     Neuro/Psych negative neurological ROS  negative psych ROS   GI/Hepatic negative GI ROS, Neg liver ROS,   Endo/Other  negative endocrine ROS  Renal/GU Renal disease ([facility])  negative genitourinary   Musculoskeletal  (+) Arthritis , Osteoarthritis,    Abdominal   Peds  Hematology  (+) anemia ,   Anesthesia Other Findings   Reproductive/Obstetrics                           Anesthesia Physical Anesthesia Plan  ASA: III  Anesthesia Plan: Spinal   Post-op Pain Management:    Induction:   PONV Risk Score and Plan: 1 and Propofol infusion, Treatment may vary due to age or medical condition, Ondansetron and TIVA  Airway Management Planned: Nasal Cannula and Simple Face Mask  Additional Equipment: None  Intra-op Plan:   Post-operative Plan:   Informed Consent: I have reviewed the patients History and Physical, chart, labs and discussed the procedure including the risks, benefits and alternatives for the proposed anesthesia with the patient or authorized representative who has indicated his/her understanding and acceptance.       Plan Discussed with:   Anesthesia Plan Comments:        Anesthesia Quick Evaluation

## 2019-10-14 NOTE — Progress Notes (Signed)
Ambulated patient from chair to bed with assist x1 and wheeled walker, patient tolerated well, right knee ace CDI, Hemovac patent for bloody drainage,  +pp, +csm, no numbness/tingling, using IS, no N/V, foley patent for clear yellow urine, once in bed Ice bags placed on right knee, scd's applied, call bell in reach, medicated for pain per mar, in no acute distress, vss, will continue to monitor.

## 2019-10-14 NOTE — Interval H&P Note (Signed)
History and Physical Interval Note:  10/14/2019 10:23 AM  Marvin Garcia  has presented today for surgery, with the diagnosis of Right knee osteoarthritis.  The various methods of treatment have been discussed with the patient and family. After consideration of risks, benefits and other options for treatment, the patient has consented to  Procedure(s) with comments: TOTAL KNEE ARTHROPLASTY (Right) - adductor canal as a surgical intervention.  The patient's history has been reviewed, patient examined, no change in status, stable for surgery.  I have reviewed the patient's chart and labs.  Questions were answered to the patient's satisfaction.     Jarelis Ehlert ANDREW

## 2019-10-14 NOTE — Transfer of Care (Signed)
Immediate Anesthesia Transfer of Care Note  Patient: Rhodes Calvert  Procedure(s) Performed: TOTAL KNEE ARTHROPLASTY (Right Knee)  Patient Location: PACU  Anesthesia Type:Spinal  Level of Consciousness: awake  Airway & Oxygen Therapy: Patient Spontanous Breathing and Patient connected to face mask oxygen  Post-op Assessment: Report given to RN and Post -op Vital signs reviewed and stable  Post vital signs: Reviewed and stable  Last Vitals:  Vitals Value Taken Time  BP    Temp    Pulse 66 10/14/19 1332  Resp 18 10/14/19 1332  SpO2 94 % 10/14/19 1332  Vitals shown include unvalidated device data.  Last Pain:  Vitals:   10/14/19 1010  TempSrc:   PainSc: 0-No pain         Complications: No apparent anesthesia complications

## 2019-10-14 NOTE — Plan of Care (Signed)

## 2019-10-14 NOTE — Progress Notes (Signed)
AssistedDr. Carolyn Witman with right, ultrasound guided, adductor canal block. Side rails up, monitors on throughout procedure. See vital signs in flow sheet. Tolerated Procedure well.  

## 2019-10-14 NOTE — Anesthesia Procedure Notes (Signed)
Anesthesia Regional Block: Adductor canal block   Pre-Anesthetic Checklist: ,, timeout performed, Correct Patient, Correct Site, Correct Laterality, Correct Procedure, Correct Position, site marked, Risks and benefits discussed,  Surgical consent,  Pre-op evaluation,  At surgeon's request and post-op pain management  Laterality: Right  Prep: chloraprep       Needles:  Injection technique: Single-shot  Needle Type: Echogenic Stimulator Needle     Needle Length: 10cm  Needle Gauge: 21     Additional Needles:   Procedures:,,,, ultrasound used (permanent image in chart),,,,  Narrative:  Start time: 10/14/2019 10:03 AM End time: 10/14/2019 10:08 AM Injection made incrementally with aspirations every 5 mL.  Performed by: Personally  Anesthesiologist: Lidia Collum, MD  Additional Notes: Monitors applied. Injection made in 5cc increments. No resistance to injection. Good needle visualization. Patient tolerated procedure well.

## 2019-10-14 NOTE — Interval H&P Note (Signed)
History and Physical Interval Note:  10/14/2019 10:23 AM  Marvin Garcia  has presented today for surgery, with the diagnosis of Right knee osteoarthritis.  The various methods of treatment have been discussed with the patient and family. After consideration of risks, benefits and other options for treatment, the patient has consented to  Procedure(s) with comments: TOTAL KNEE ARTHROPLASTY (Right) - adductor canal as a surgical intervention.  The patient's history has been reviewed, patient examined, no change in status, stable for surgery.  I have reviewed the patient's chart and labs.  Questions were answered to the patient's satisfaction.     [name] ANDREW

## 2019-10-15 DIAGNOSIS — M1711 Unilateral primary osteoarthritis, right knee: Secondary | ICD-10-CM | POA: Diagnosis not present

## 2019-10-15 MED ORDER — FUROSEMIDE 40 MG PO TABS: 40.0000 mg | ORAL_TABLET | ORAL | Status: DC

## 2019-10-15 MED ORDER — ISOSORBIDE MONONITRATE ER 30 MG PO TB24
30.0000 mg | ORAL_TABLET | Freq: Every day | ORAL | Status: DC
  Administered 2019-10-15 – 2019-10-16 (×2): 30 mg via ORAL
  Filled 2019-10-15 (×2): qty 1

## 2019-10-15 MED ORDER — HYDRALAZINE HCL 25 MG PO TABS
100.0000 mg | ORAL_TABLET | Freq: Three times a day (TID) | ORAL | Status: DC
  Administered 2019-10-16: 100 mg via ORAL
  Filled 2019-10-15 (×3): qty 4

## 2019-10-15 MED ORDER — FUROSEMIDE 40 MG PO TABS
80.0000 mg | ORAL_TABLET | Freq: Every day | ORAL | Status: DC
  Administered 2019-10-16: 80 mg via ORAL
  Filled 2019-10-15: qty 2

## 2019-10-15 MED ORDER — IRBESARTAN 150 MG PO TABS: 150.0000 mg | ORAL_TABLET | Freq: Every day | ORAL | Status: DC

## 2019-10-15 MED ORDER — AMLODIPINE BESYLATE 10 MG PO TABS
10.0000 mg | ORAL_TABLET | Freq: Every day | ORAL | Status: DC
  Administered 2019-10-15: 10 mg via ORAL
  Filled 2019-10-15: qty 1

## 2019-10-15 MED ORDER — CLONIDINE HCL 0.1 MG PO TABS
0.3000 mg | ORAL_TABLET | Freq: Three times a day (TID) | ORAL | Status: DC
  Administered 2019-10-15 – 2019-10-16 (×3): 0.3 mg via ORAL
  Filled 2019-10-15 (×3): qty 3

## 2019-10-15 MED ORDER — CHLORPROMAZINE HCL 25 MG PO TABS
25.0000 mg | ORAL_TABLET | Freq: Four times a day (QID) | ORAL | Status: DC | PRN
  Administered 2019-10-15 (×2): 25 mg via ORAL
  Filled 2019-10-15 (×5): qty 1

## 2019-10-15 MED ORDER — FERROUS SULFATE 325 (65 FE) MG PO TABS
325.0000 mg | ORAL_TABLET | Freq: Every day | ORAL | Status: DC
  Administered 2019-10-15 – 2019-10-16 (×2): 325 mg via ORAL
  Filled 2019-10-15 (×2): qty 1

## 2019-10-15 MED ORDER — FUROSEMIDE 40 MG PO TABS: 40.0000 mg | ORAL_TABLET | Freq: Every evening | ORAL | Status: DC

## 2019-10-15 MED ORDER — CARVEDILOL 25 MG PO TABS
25.0000 mg | ORAL_TABLET | Freq: Two times a day (BID) | ORAL | Status: DC
  Administered 2019-10-15 – 2019-10-16 (×2): 25 mg via ORAL
  Filled 2019-10-15 (×2): qty 1

## 2019-10-15 NOTE — Discharge Summary (Signed)
Physician Discharge Summary  Patient ID: Marvin Garcia MRN: [id number] DOB/AGE: 63-19-1957 63 y.o.  Admit date: 10/14/2019 Discharge date: 10/15/2019  Admission Diagnoses: Right knee osteoarthritis  Discharge Diagnoses:  Active Problems:   Osteoarthritis of right knee   Discharged Condition: good  Hospital Course: Patient was admitted on March fifth for a right total knee arthroplasty due to right knee end-stage osteoarthritis.  Patient did well during surgery.  He was sent to PACU in stable condition.  He was sent up to the postop floor in stable condition.  He was able to start physical therapy that night.  He was able to get up and walk down the hall with no issues.  No events overnight.  Postop day 1 Hemovac drain was removed.  Patient got up with physical therapy with no issues.  He was able to be discharged home postop day 1.  Patient will start aspirin 325 mg twice a day starting Sunday morning.  He will follow-up in the office in 2 weeks.  He was discharged home with pain medication, oxycodone, muscle relaxer, Robaxin, nausea medication, Zofran.  Consults: None  Significant Diagnostic Studies: none  Treatments: IV hydration, antibiotics: Ancef, analgesia: acetaminophen w/ codeine, anticoagulation: Lovenox, therapies: PT and surgery: right TKA  Discharge Exam: Blood pressure (!) 169/92, pulse 70, temperature 98.6 F (37 C), temperature source Oral, resp. rate 18, height 5\' 7"  (1.702 m), weight 106.8 kg, SpO2 95 %. General appearance: alert, cooperative, appears stated age and no distress Extremities: extremities normal, atraumatic, no cyanosis or edema and Homans sign is negative, no sign of DVT Pulses: 2+ and symmetric Skin: Skin color, texture, turgor normal. No rashes or lesions Neurologic: Alert and oriented X 3, normal strength and tone. Normal symmetric reflexes. Normal coordination and gait Incision/Wound: Dressings clean dry and intact  Disposition: Discharge  disposition: 01-Home or Self Care       Discharge Instructions    Call MD / Call 911   Complete by: As directed    If you experience chest pain or shortness of breath, CALL 911 and be transported to the hospital emergency room.  If you develope a fever above 101 F, pus (white drainage) or increased drainage or redness at the wound, or calf pain, call your surgeon's office.   Constipation Prevention   Complete by: As directed    Drink plenty of fluids.  Prune juice may be helpful.  You may use a stool softener, such as Colace (over the counter) 100 mg twice a day.  Use MiraLax (over the counter) for constipation as needed.   Diet - low sodium heart healthy   Complete by: As directed    Discharge instructions   Complete by: As directed    Dr. Sydnee Cabal [facility] [address] [place], [place] [address] [phone]  TOTAL KNEE REPLACEMENT POSTOPERATIVE DIRECTIONS  Knee Rehabilitation, Guidelines Following Surgery  Results after knee surgery are often greatly improved when you follow the exercise, range of motion and muscle strengthening exercises prescribed by your doctor. Safety measures are also important to protect the knee from further injury. Any time any of these exercises cause you to have increased pain or swelling in your knee joint, decrease the amount until you are comfortable again and slowly increase them. If you have problems or questions, call your caregiver or physical therapist for advice.   HOME CARE INSTRUCTIONS  Remove items at home which could result in a fall. This includes throw rugs or furniture in walking  pathways.  ICE to the affected knee every three hours for 30 minutes at a time and then as needed for pain and swelling.  Continue to use ice on the knee for pain and swelling from surgery. You may notice swelling that will progress down to the foot and ankle.  This is normal after surgery.  Elevate the leg when you are not up walking on it.    Continue to use the breathing machine which will help keep your temperature down.  It is common for your temperature to cycle up and down following surgery, especially at night when you are not up moving around and exerting yourself.  The breathing machine keeps your lungs expanded and your temperature down. Do not place pillow under knee, focus on keeping the knee straight while resting   DIET You may resume your previous home diet once your are discharged from the hospital.  DRESSING / WOUND CARE / SHOWERING Keep the surgical dressing until follow up.  The dressing is water proof, but you need to put extra covering over it like plastic wrap.  IF THE DRESSING FALLS OFF or the wound gets wet inside, change the dressing with sterile gauze.  Please use good hand washing techniques before changing the dressing.  Do not use any lotions or creams on the incision until instructed by your surgeon.   You may start showering once you are discharged home but do not submerge the incision under water.  You are sent home with an ACE bandage on over the leg, this can be removed at 3 days from surgery. At this time you can start showering. Please place the white TED stocking on the surgical leg after. This needs to be worn on the surgical leg for 2 weeks after surgery.   ACTIVITY Walk with your walker as instructed. Use walker as long as suggested by your caregivers. Avoid periods of inactivity such as sitting longer than an hour when not asleep. This helps prevent blood clots.  You may resume a sexual relationship in one month or when given the OK by your doctor.  You may return to work once you are cleared by your doctor.  Do not drive a car for 6 weeks or until released by you surgeon.  Do not drive while taking narcotics.  WEIGHT BEARING Weight bearing as tolerated with assist device (walker, cane, etc) as directed, use it as long as suggested by your surgeon or therapist, typically at least 4-6  weeks.  POSTOPERATIVE CONSTIPATION PROTOCOL Constipation - defined medically as fewer than three stools per week and severe constipation as less than one stool per week.  One of the most common issues patients have following surgery is constipation.  Even if you have a regular bowel pattern at home, your normal regimen is likely to be disrupted due to multiple reasons following surgery.  Combination of anesthesia, postoperative narcotics, change in appetite and fluid intake all can affect your bowels.  In order to avoid complications following surgery, here are some recommendations in order to help you during your recovery period.  Colace (docusate) - Pick up an over-the-counter form of Colace or another stool softener and take twice a day as long as you are requiring postoperative pain medications.  Take with a full glass of water daily.  If you experience loose stools or diarrhea, hold the colace until you stool forms back up.  If your symptoms do not get better within 1 week or if they get worse, check  with your doctor.  Dulcolax (bisacodyl) - Pick up over-the-counter and take as directed by the product packaging as needed to assist with the movement of your bowels.  Take with a full glass of water.  Use this product as needed if not relieved by Colace only.   MiraLax (polyethylene glycol) - Pick up over-the-counter to have on hand.  MiraLax is a solution that will increase the amount of water in your bowels to assist with bowel movements.  Take as directed and can mix with a glass of water, juice, soda, coffee, or tea.  Take if you go more than two days without a movement. Do not use MiraLax more than once per day. Call your doctor if you are still constipated or irregular after using this medication for 7 days in a row.  If you continue to have problems with postoperative constipation, please contact the office for further assistance and recommendations.  If you experience "the worst abdominal  pain ever" or develop nausea or vomiting, please contact the office immediatly for further recommendations for treatment.  ITCHING  If you experience itching with your medications, try taking only a single pain pill, or even half a pain pill at a time.  You can also use Benadryl over the counter for itching or also to help with sleep.   TED HOSE STOCKINGS Wear the elastic stockings on both legs for two weeks following surgery during the day but you may remove then at night for sleeping.  Okay to remove ACE in 3 days, put TED on after  MEDICATIONS See your medication summary on the "After Visit Summary" that the nursing staff will review with you prior to discharge.  You may have some home medications which will be placed on hold until you complete the course of blood thinner medication.  It is important for you to complete the blood thinner medication as prescribed by your surgeon.  Continue your approved medications as instructed at time of discharge. If you were not previously taking any blood thinners prior to surgery please start taking Aspirin 325 mg tabs twice daily for 6 weeks. If you are unable to take Aspirin please let your doctor know.   PRECAUTIONS If you experience chest pain or shortness of breath - call 911 immediately for transfer to the hospital emergency department.  If you develop a fever greater that 101 F, purulent drainage from wound, increased redness or drainage from wound, foul odor from the wound/dressing, or calf pain - CONTACT YOUR SURGEON.                                                   FOLLOW-UP APPOINTMENTS Make sure you keep all of your appointments after your operation with your surgeon and caregivers. You should call the office at the above phone number and make an appointment for approximately two weeks after the date of your surgery or on the date instructed by your surgeon outlined in the "After Visit Summary".   RANGE OF MOTION AND STRENGTHENING EXERCISES   Rehabilitation of the knee is important following a knee injury or an operation. After just a few days of immobilization, the muscles of the thigh which control the knee become weakened and shrink (atrophy). Knee exercises are designed to build up the tone and strength of the thigh muscles and to improve knee motion.  Often times heat used for twenty to thirty minutes before working out will loosen up your tissues and help with improving the range of motion but do not use heat for the first two weeks following surgery. These exercises can be done on a training (exercise) mat, on the floor, on a table or on a bed. Use what ever works the best and is most comfortable for you Knee exercises include:  Leg Lifts - While your knee is still immobilized in a splint or cast, you can do straight leg raises. Lift the leg to 60 degrees, hold for 3 sec, and slowly lower the leg. Repeat 10-20 times 2-3 times daily. Perform this exercise against resistance later as your knee gets better.  Quad and Hamstring Sets - Tighten up the muscle on the front of the thigh (Quad) and hold for 5-10 sec. Repeat this 10-20 times hourly. Hamstring sets are done by pushing the foot backward against an object and holding for 5-10 sec. Repeat as with quad sets.  Leg Slides: Lying on your back, slowly slide your foot toward your buttocks, bending your knee up off the floor (only go as far as is comfortable). Then slowly slide your foot back down until your leg is flat on the floor again. Angel Wings: Lying on your back spread your legs to the side as far apart as you can without causing discomfort.  A rehabilitation program following serious knee injuries can speed recovery and prevent re-injury in the future due to weakened muscles. Contact your doctor or a physical therapist for more information on knee rehabilitation.   IF YOU ARE TRANSFERRED TO A SKILLED REHAB FACILITY If the patient is transferred to a skilled rehab facility following  release from the hospital, a list of the current medications will be sent to the facility for the patient to continue.  When discharged from the skilled rehab facility, please have the facility set up the patient's [place] prior to being released. Also, the skilled facility will be responsible for providing the patient with their medications at time of release from the facility to include their pain medication, the muscle relaxants, and their blood thinner medication. If the patient is still at the rehab facility at time of the two week follow up appointment, the skilled rehab facility will also need to assist the patient in arranging follow up appointment in our office and any transportation needs.  MAKE SURE YOU:  Understand these instructions.  Get help right away if you are not doing well or get worse.    Pick up stool softner and laxative for home use following surgery while on pain medications. May shower starting three days after surgery. Please use a clean towel to pat the leg dry following showers. Continue to use ice for pain and swelling after surgery. Do not use any lotions or creams on the incision until instructed by you Start Aspirin immediately following surgery.   Do not put a pillow under the knee. Place it under the heel.   Complete by: As directed    Driving restrictions   Complete by: As directed    No driving for two weeks   TED hose   Complete by: As directed    Use stockings (TED hose) for three weeks on both leg(s).  You may remove them at night for sleeping.   Weight bearing as tolerated   Complete by: As directed      Allergies as of 10/15/2019  Reactions   Hydrocodone Other (See Comments)   Severe hiccups      Medication List    TAKE these medications   acetaminophen 650 MG CR tablet Commonly known as: TYLENOL Take 650-1,300 mg by mouth every 8 (eight) hours as needed for pain.   amLODipine 10 MG tablet Commonly known as:  NORVASC Take 10 mg by mouth at bedtime.   aspirin 81 MG chewable tablet Chew 1 tablet (81 mg total) by mouth 2 (two) times daily. What changed: when to take this   atorvastatin 20 MG tablet Commonly known as: LIPITOR Take 20 mg by mouth every evening.   calcitRIOL 0.25 MCG capsule Commonly known as: ROCALTROL Take 0.25 mcg by mouth every other day.   carvedilol 25 MG tablet Commonly known as: COREG Take 25 mg by mouth 2 (two) times daily with a meal.   cloNIDine 0.3 MG tablet Commonly known as: CATAPRES Take 0.3 mg by mouth in the morning, at noon, and at bedtime.   colchicine 0.6 MG tablet Take 0.6 mg by mouth daily as needed (for gout flare up).   CoQ-10 100 MG Caps Take 100 mg by mouth daily.   Febuxostat 80 MG Tabs Take 80 mg by mouth daily.   ferrous sulfate 325 (65 FE) MG tablet Take 325 mg by mouth daily.   furosemide 40 MG tablet Commonly known as: LASIX Take 40-80 mg by mouth See admin instructions. Take 80 mg by mouth every morning and take 40 mg by mouth every evening   hydrALAZINE 100 MG tablet Commonly known as: APRESOLINE Take 100 mg by mouth 3 (three) times daily.   irbesartan 150 MG tablet Commonly known as: AVAPRO Take 150 mg by mouth daily.   isosorbide mononitrate 30 MG 24 hr tablet Commonly known as: IMDUR Take 30 mg by mouth daily.   methocarbamol 500 MG tablet Commonly known as: Robaxin Take 1 tablet (500 mg total) by mouth 4 (four) times daily.   multivitamin with minerals Tabs tablet Take 1 tablet by mouth daily.   Omega 3 500 500 MG Caps Take 500 mg by mouth daily.   ondansetron 4 MG tablet Commonly known as: Zofran Take 1 tablet (4 mg total) by mouth daily as needed for nausea or vomiting.   oxyCODONE 5 MG immediate release tablet Commonly known as: Roxicodone Take 1 tablet (5 mg total) by mouth every 4 (four) hours as needed.   vitamin C 1000 MG tablet Take 1,000 mg by mouth daily.            Discharge Care  Instructions  (From admission, onward)         Start     Ordered   10/14/19 0000  Weight bearing as tolerated     10/14/19 1038           Signed: Drue Novel, PA-C [facility] 10/15/2019, 7:33 AM

## 2019-10-15 NOTE — TOC Progression Note (Signed)
Transition of Care [facility]) - Progression Note    Patient Details  Name: Marvin Garcia MRN: [id number] Date of Birth: 1955/11/13  [facility] CM/SW Contact  Joaquin Courts, RN Phone Number: 10/15/2019, 12:25 PM  Clinical Narrative:    CM spoke with patient. Patient set up with [facility] for [place]. Patient reports he has rolling walker and 3in1.    Expected Discharge Plan: [place] Barriers to Discharge: No Barriers Identified  Expected Discharge Plan and Services Expected Discharge Plan: [place]   Discharge Planning Services: CM Consult Post Acute Care Choice: [place] arrangements for the past 2 months: Single Family Home Expected Discharge Date: 10/15/19               DME Arranged: N/A DME Agency: NA       HH Arranged: PT [place] Agency: [facility] (formerly [employer]) Date [place]: 10/15/19 Time [place]: 1225 Representative spoke with at [place]: [place] ([place]) Interventions    Readmission Risk Interventions No flowsheet data found.

## 2019-10-15 NOTE — Progress Notes (Signed)
Physical Therapy Treatment Patient Details Name: Marvin Garcia MRN: [id number] DOB: 12/26/1955 Today's Date: 10/15/2019    History of Present Illness Patient is 64 y.o. male s/p Rt TKA on 10/14/19 with PMH significant for  HTN, CKD, OA, Lt THA, Lt TKA..    PT Comments    Pt progressing well with mobility and eager for dc home.   Follow Up Recommendations  Follow surgeon's recommendation for DC plan and follow-up therapies     Equipment Recommendations  None recommended by PT    Recommendations for Other Services       Precautions / Restrictions Precautions Precautions: Fall Restrictions Weight Bearing Restrictions: No    Mobility  Bed Mobility Overal bed mobility: Needs Assistance Bed Mobility: Supine to Sit     Supine to sit: Min guard     General bed mobility comments: Increased time with assist of bed rail  Transfers Overall transfer level: Needs assistance Equipment used: Rolling walker (2 wheeled) Transfers: Sit to/from Stand Sit to Stand: From elevated surface;Min guard         General transfer comment: cues for hand placement and technique with RW, assist required to complete power up and steady with rising.  Ambulation/Gait Ambulation/Gait assistance: Min guard Gait Distance (Feet): 120 Feet Assistive device: Rolling walker (2 wheeled) Gait Pattern/deviations: Step-to pattern;Decreased stride length Gait velocity: fair   General Gait Details: cues for sequence, posture and position from [employer]             Wheelchair Mobility    Modified Rankin (Stroke Patients Only)       Balance Overall balance assessment: Needs assistance Sitting-balance support: Feet supported Sitting balance-Leahy Scale: Good     Standing balance support: Bilateral upper extremity supported;During functional activity Standing balance-Leahy Scale: Fair                              Cognition Arousal/Alertness: Awake/alert Behavior During  Therapy: WFL for tasks assessed/performed Overall Cognitive Status: Within Functional Limits for tasks assessed                                        Exercises Total Joint Exercises Ankle Circles/Pumps: AROM;Both;15 reps;Supine Quad Sets: AROM;Both;10 reps;Supine Heel Slides: Right;AAROM;15 reps;Supine Straight Leg Raises: AAROM;AROM;Right;15 reps;Supine    General Comments        Pertinent Vitals/Pain Pain Assessment: 0-10 Pain Score: 3  Pain Location: Rt knee Pain Descriptors / Indicators: Aching Pain Intervention(s): Limited activity within patient's tolerance;Monitored during session;Premedicated before session;Ice applied    Home Living                      Prior Function            PT Goals (current goals can now be found in the care plan section) Acute Rehab PT Goals Patient Stated Goal: Regain IND PT Goal Formulation: With patient Time For Goal Achievement: 10/21/19 Potential to Achieve Goals: Good Progress towards PT goals: Progressing toward goals    Frequency    7X/week      PT Plan Current plan remains appropriate    Co-evaluation              AM-PAC PT "6 Clicks" Mobility   Outcome Measure  Help needed turning from your back to your side while in a flat  bed without using bedrails?: A Little Help needed moving from lying on your back to sitting on the side of a flat bed without using bedrails?: A Little Help needed moving to and from a bed to a chair (including a wheelchair)?: A Little Help needed standing up from a chair using your arms (e.g., wheelchair or bedside chair)?: A Little Help needed to walk in hospital room?: A Little Help needed climbing 3-5 steps with a railing? : A Little 6 Click Score: 18    End of Session Equipment Utilized During Treatment: Gait belt Activity Tolerance: Patient tolerated treatment well Patient left: in chair;with call bell/phone within reach;with chair alarm set;with  family/visitor present Nurse Communication: Mobility status PT Visit Diagnosis: Muscle weakness (generalized) (M62.81);Difficulty in walking, not elsewhere classified (R26.2)     Time: 5852-7782 PT Time Calculation (min) (ACUTE ONLY): 32 min  Charges:  $Gait Training: 8-22 mins $Therapeutic Exercise: 8-22 mins                     Andrew Pager [phone] Office [phone]    Basim Bartnik 10/15/2019, 1:02 PM

## 2019-10-15 NOTE — Progress Notes (Addendum)
On-call MD paged regarding pt's c/o hiccups.   Dierdre Highman, PA gave verbal orders for Thorazine 25 mg PO q-6 PRN hiccups.

## 2019-10-15 NOTE — Progress Notes (Signed)
Physical Therapy Treatment Patient Details Name: Marvin Garcia MRN: [id number] DOB: 1956/06/14 Today's Date: 10/15/2019    History of Present Illness Patient is 64 y.o. male s/p Rt TKA on 10/14/19 with PMH significant for  HTN, CKD, OA, Lt THA, Lt TKA..    PT Comments    Pt very cooperative but limited this pm by increased pain.   Follow Up Recommendations  Follow surgeon's recommendation for DC plan and follow-up therapies     Equipment Recommendations  None recommended by PT    Recommendations for Other Services       Precautions / Restrictions Precautions Precautions: Fall Restrictions Weight Bearing Restrictions: No    Mobility  Bed Mobility Overal bed mobility: Needs Assistance Bed Mobility: Sit to Supine       Sit to supine: Min guard   General bed mobility comments: Increased time with assist of bed rail  Transfers Overall transfer level: Needs assistance Equipment used: Rolling walker (2 wheeled) Transfers: Sit to/from Stand Sit to Stand: Min guard         General transfer comment: cues for hand placement and technique with RW, assist required to complete power up and steady with rising.  Ambulation/Gait Ambulation/Gait assistance: Min guard Gait Distance (Feet): 40 Feet Assistive device: Rolling walker (2 wheeled) Gait Pattern/deviations: Step-to pattern;Decreased stride length Gait velocity: fair   General Gait Details: cues for sequence, posture and position from [employer]             Wheelchair Mobility    Modified Rankin (Stroke Patients Only)       Balance Overall balance assessment: Needs assistance Sitting-balance support: Feet supported Sitting balance-Leahy Scale: Good     Standing balance support: Bilateral upper extremity supported;During functional activity Standing balance-Leahy Scale: Fair                              Cognition Arousal/Alertness: Awake/alert Behavior During Therapy: WFL for  tasks assessed/performed Overall Cognitive Status: Within Functional Limits for tasks assessed                                        Exercises      General Comments        Pertinent Vitals/Pain Pain Assessment: 0-10 Pain Score: 6  Pain Location: Rt knee Pain Descriptors / Indicators: Aching Pain Intervention(s): Limited activity within patient's tolerance;Monitored during session;Premedicated before session;Ice applied    Home Living                      Prior Function            PT Goals (current goals can now be found in the care plan section) Acute Rehab PT Goals Patient Stated Goal: Regain IND PT Goal Formulation: With patient Time For Goal Achievement: 10/21/19 Potential to Achieve Goals: Good Progress towards PT goals: Progressing toward goals    Frequency    7X/week      PT Plan Current plan remains appropriate    Co-evaluation              AM-PAC PT "6 Clicks" Mobility   Outcome Measure  Help needed turning from your back to your side while in a flat bed without using bedrails?: A Little Help needed moving from lying on your back to sitting on the side of  a flat bed without using bedrails?: A Little Help needed moving to and from a bed to a chair (including a wheelchair)?: A Little Help needed standing up from a chair using your arms (e.g., wheelchair or bedside chair)?: A Little Help needed to walk in hospital room?: A Little Help needed climbing 3-5 steps with a railing? : A Little 6 Click Score: 18    End of Session Equipment Utilized During Treatment: Gait belt Activity Tolerance: Patient tolerated treatment well;Patient limited by pain Patient left: in bed;with call bell/phone within reach;with bed alarm set Nurse Communication: Mobility status PT Visit Diagnosis: Muscle weakness (generalized) (M62.81);Difficulty in walking, not elsewhere classified (R26.2)     Time: 2297-9892 PT Time Calculation (min)  (ACUTE ONLY): 13 min  Charges:  $Gait Training: 8-22 mins                     [facility] Pager [phone] Office [phone]    Toniya Rozar 10/15/2019, 4:32 PM

## 2019-10-15 NOTE — Progress Notes (Signed)
On-call PA paged regarding the pt's inability to complete stairs with PT d/t pain and the pt's BP meds aren't ordered. Pt's BP has been trending high since admission. Pt stated he hasn't received any BP meds he's on at home during his stay.

## 2019-10-15 NOTE — Progress Notes (Signed)
Pt's VS:T- 101.1*f,B/P-187/93,HR-80,R-20  At 2100 and V/S : T-99.9,B/P-194/98,HR-82,R-18, Notified to Barry Dienes NP. No new order received. Will continue to monitor.

## 2019-10-15 NOTE — Progress Notes (Signed)
Subjective: 1 Day Post-Op Procedure(s) (LRB): TOTAL KNEE ARTHROPLASTY (Right) Patient reports pain as mild.   Patient is doing well this morning.  He was able to get up with physical therapy last night with no complaints.  He is able to walk about 90 feet.  Minimal discomfort in the knee at this time. - void/ + flatus  Objective: Vital signs in last 24 hours: Temp:  [97.7 F (36.5 C)-99 F (37.2 C)] 98.6 F (37 C) (03/06 0617) Pulse Rate:  [52-73] 70 (03/06 0617) Resp:  [11-20] 18 (03/06 0617) BP: (114-174)/(67-95) 169/92 (03/06 0617) SpO2:  [93 %-100 %] 95 % (03/06 0617) Weight:  [106.8 kg-110 kg] 106.8 kg (03/05 0832)  Intake/Output from previous day: 03/05 0701 - 03/06 0700 In: 3578 [P.O.:1560; I.V.:1718; IV Piggyback:300] Out: 2170 [Urine:1750; Drains:355; Blood:50] Intake/Output this shift: No intake/output data recorded.  Recent Labs    10/14/19 1338  HGB 11.0*   Recent Labs    10/14/19 1338  WBC 8.2  RBC 4.60  HCT 33.5*  PLT 286   Recent Labs    10/14/19 1338  CREATININE 4.42*   Recent Labs    10/14/19 0900  INR 1.0    Neurologically intact Neurovascular intact Sensation intact distally Intact pulses distally Dorsiflexion/Plantar flexion intact Incision: dressing C/D/I No cellulitis present Compartment soft   Assessment/Plan: 1 Day Post-Op Procedure(s) (LRB): TOTAL KNEE ARTHROPLASTY (Right) Up with therapy Discharge home with home health Patient needs home health physical therapy set up for him prior to discharge Patient can be discharged home today as long as he is able to get up with therapy with no complaints and is able to void prior to discharge Patient will start aspirin 325 mg twice a day starting tomorrow morning Patient follow-up in the office in 2 weeks   Patient's anticipated LOS is less than 2 midnights, meeting these requirements: - Younger than 27 - Lives within 1 hour of care - Has a competent adult at home to recover  with post-op recover - NO history of  - Chronic pain requiring opiods  - Diabetes  - Coronary Artery Disease  - Heart failure  - Heart attack  - Stroke  - DVT/VTE  - Cardiac arrhythmia  - Respiratory Failure/COPD  - Renal failure  - Anemia  - Advanced Liver disease       Drue Novel, PA-C [facility] 10/15/2019, 7:29 AM

## 2019-10-16 DIAGNOSIS — M1711 Unilateral primary osteoarthritis, right knee: Secondary | ICD-10-CM | POA: Diagnosis not present

## 2019-10-16 NOTE — Progress Notes (Signed)
Physical Therapy Treatment Patient Details Name: Marvin Garcia MRN: [id number] DOB: 05-05-1956 Today's Date: 10/16/2019    History of Present Illness Patient is 64 y.o. male s/p Rt TKA on 10/14/19 with PMH significant for  HTN, CKD, OA, Lt THA, Lt TKA..    PT Comments    Pt in good spirits and reports much better pain control this date.  Pt performed HEP with assist, negotiated step up for home access and ambulated increased distance in hall.  Pt eager for dc home this date.   Follow Up Recommendations  Follow surgeon's recommendation for DC plan and follow-up therapies     Equipment Recommendations  None recommended by PT    Recommendations for Other Services       Precautions / Restrictions Precautions Precautions: Fall Restrictions Weight Bearing Restrictions: No    Mobility  Bed Mobility Overal bed mobility: Needs Assistance Bed Mobility: Supine to Sit     Supine to sit: Supervision     General bed mobility comments: Increased time with assist of bed rail  Transfers Overall transfer level: Needs assistance Equipment used: Rolling walker (2 wheeled) Transfers: Sit to/from Stand Sit to Stand: Supervision         General transfer comment: cues for hand placement and technique with RW  Ambulation/Gait Ambulation/Gait assistance: Min guard;Supervision Gait Distance (Feet): 100 Feet Assistive device: Rolling walker (2 wheeled) Gait Pattern/deviations: Step-to pattern;Decreased stride length     General Gait Details: cues for sequence, posture and position from RW   Stairs Stairs: Yes Stairs assistance: Min assist Stair Management: No rails;Step to pattern;Forwards;With walker Number of Stairs: 3 General stair comments: single step x 3 with RW and cues for sequence and foot/RW placement   Wheelchair Mobility    Modified Rankin (Stroke Patients Only)       Balance Overall balance assessment: Needs assistance Sitting-balance support: Feet  supported Sitting balance-Leahy Scale: Good     Standing balance support: Bilateral upper extremity supported;During functional activity Standing balance-Leahy Scale: Fair                              Cognition Arousal/Alertness: Awake/alert Behavior During Therapy: WFL for tasks assessed/performed Overall Cognitive Status: Within Functional Limits for tasks assessed                                        Exercises Total Joint Exercises Ankle Circles/Pumps: AROM;Both;15 reps;Supine Quad Sets: AROM;Both;10 reps;Supine Heel Slides: Right;AAROM;Supine;20 reps Straight Leg Raises: AAROM;AROM;Right;Supine;20 reps Goniometric ROM: AAROM R knee -4 - 40    General Comments        Pertinent Vitals/Pain Pain Assessment: 0-10 Pain Score: 3  Pain Location: Rt knee Pain Descriptors / Indicators: Aching Pain Intervention(s): Limited activity within patient's tolerance;Monitored during session;Premedicated before session;Ice applied    Home Living                      Prior Function            PT Goals (current goals can now be found in the care plan section) Acute Rehab PT Goals Patient Stated Goal: Regain IND PT Goal Formulation: With patient Time For Goal Achievement: 10/21/19 Potential to Achieve Goals: Good Progress towards PT goals: Progressing toward goals    Frequency    7X/week      PT Plan  Current plan remains appropriate    Co-evaluation              AM-PAC PT "6 Clicks" Mobility   Outcome Measure  Help needed turning from your back to your side while in a flat bed without using bedrails?: A Little Help needed moving from lying on your back to sitting on the side of a flat bed without using bedrails?: A Little Help needed moving to and from a bed to a chair (including a wheelchair)?: A Little Help needed standing up from a chair using your arms (e.g., wheelchair or bedside chair)?: A Little Help needed to walk  in hospital room?: A Little Help needed climbing 3-5 steps with a railing? : A Little 6 Click Score: 18    End of Session Equipment Utilized During Treatment: Gait belt Activity Tolerance: Patient tolerated treatment well Patient left: in chair;with call bell/phone within reach Nurse Communication: Mobility status PT Visit Diagnosis: Muscle weakness (generalized) (M62.81);Difficulty in walking, not elsewhere classified (R26.2)     Time: 1324-4010 PT Time Calculation (min) (ACUTE ONLY): 33 min  Charges:  $Gait Training: 8-22 mins $Therapeutic Exercise: 8-22 mins                     [place] Pager [phone] Office [phone]    Dinah Lupa 10/16/2019, 11:49 AM

## 2019-10-16 NOTE — Progress Notes (Signed)
Pt alert and oriented, not eating much, not much of an appetite.  D/C instructions were given and pt was d/cd home with spouse.

## 2019-10-16 NOTE — Progress Notes (Signed)
Patient ID: Marvin Garcia, male   DOB: February 21, 1956, 64 y.o.   MRN: [id number] Subjective: 2 Days Post-Op Procedure(s) (LRB): TOTAL KNEE ARTHROPLASTY (Right)    Patient reports pain as mild to moderate. Pain from yesterday improved throughout afternoon and night  Objective:   VITALS:   Vitals:   10/15/19 2050 10/15/19 2201  BP: (!) 187/93 (!) 194/98  Pulse: 80 82  Resp: 20 18  Temp: (!) 101.1 F (38.4 C) 99.9 F (37.7 C)  SpO2: 99% 99%    Neurovascular intact Incision: dressing C/D/I  LABS Recent Labs    10/14/19 1338  HGB 11.0*  HCT 33.5*  WBC 8.2  PLT 286    Recent Labs    10/14/19 1338  CREATININE 4.42*    Recent Labs    10/14/19 0900  INR 1.0     Assessment/Plan: 2 Days Post-Op Procedure(s) (LRB): TOTAL KNEE ARTHROPLASTY (Right)   Up with therapy  Home today after therapy RTC in 2 weeks

## 2019-10-16 NOTE — Discharge Instructions (Signed)

## 2019-10-17 ENCOUNTER — Encounter: Payer: Self-pay | Admitting: *Deleted

## 2019-10-18 MED ORDER — BUPIVACAINE IN DEXTROSE 0.75-8.25 % IT SOLN
INTRATHECAL | Status: DC | PRN
  Administered 2019-10-14: 2 mL via INTRATHECAL

## 2019-10-18 NOTE — Anesthesia Procedure Notes (Signed)
Spinal  Patient location during procedure: OR Staffing Performed: anesthesiologist  Anesthesiologist: Onnika Siebel E, MD Preanesthetic Checklist Completed: patient identified, IV checked, risks and benefits discussed, surgical consent, monitors and equipment checked, pre-op evaluation and timeout performed Spinal Block Patient position: sitting Prep: DuraPrep and site prepped and draped Patient monitoring: continuous pulse ox, blood pressure and heart rate Approach: midline Location: L3-4 Injection technique: single-shot Needle Needle type: Pencan  Needle gauge: 24 G Needle length: 9 cm Additional Notes Functioning IV was confirmed and monitors were applied. Sterile prep and drape, including hand hygiene and sterile gloves were used. The patient was positioned and the spine was prepped. The skin was anesthetized with lidocaine.  Free flow of clear CSF was obtained prior to injecting local anesthetic into the CSF. The needle was carefully withdrawn. The patient tolerated the procedure well.      

## 2019-10-18 NOTE — Addendum Note (Signed)
Addendum  created 10/18/19 1035 by Lidia Collum, MD   Child order released for a procedure order, Clinical Note Signed, Intraprocedure Blocks edited

## 2020-04-05 ENCOUNTER — Other Ambulatory Visit: Payer: Self-pay

## 2020-04-05 DIAGNOSIS — N289 Disorder of kidney and ureter, unspecified: Secondary | ICD-10-CM

## 2020-04-17 ENCOUNTER — Encounter: Payer: 59 | Admitting: Vascular Surgery

## 2020-04-17 ENCOUNTER — Inpatient Hospital Stay (HOSPITAL_COMMUNITY): Admission: RE | Admit: 2020-04-17 | Payer: 59 | Source: Ambulatory Visit

## 2020-04-17 ENCOUNTER — Other Ambulatory Visit (HOSPITAL_COMMUNITY): Payer: 59

## 2021-05-26 ENCOUNTER — Ambulatory Visit (HOSPITAL_COMMUNITY)
Admission: EM | Admit: 2021-05-26 | Discharge: 2021-05-26 | Disposition: A | Discharge status: Home / Self Care | Payer: 59 | Attending: Emergency Medicine | Admitting: Emergency Medicine

## 2021-05-26 ENCOUNTER — Other Ambulatory Visit: Payer: Self-pay

## 2021-05-26 ENCOUNTER — Encounter (HOSPITAL_COMMUNITY): Payer: Self-pay | Admitting: Emergency Medicine

## 2021-05-26 DIAGNOSIS — Z96642 Presence of left artificial hip joint: Secondary | ICD-10-CM | POA: Insufficient documentation

## 2021-05-26 DIAGNOSIS — Z20822 Contact with and (suspected) exposure to covid-19: Secondary | ICD-10-CM | POA: Insufficient documentation

## 2021-05-26 DIAGNOSIS — N189 Chronic kidney disease, unspecified: Secondary | ICD-10-CM | POA: Diagnosis not present

## 2021-05-26 DIAGNOSIS — J069 Acute upper respiratory infection, unspecified: Secondary | ICD-10-CM | POA: Diagnosis present

## 2021-05-26 DIAGNOSIS — Z885 Allergy status to narcotic agent status: Secondary | ICD-10-CM | POA: Diagnosis not present

## 2021-05-26 DIAGNOSIS — Z79899 Other long term (current) drug therapy: Secondary | ICD-10-CM | POA: Diagnosis not present

## 2021-05-26 DIAGNOSIS — Z7982 Long term (current) use of aspirin: Secondary | ICD-10-CM | POA: Diagnosis not present

## 2021-05-26 DIAGNOSIS — I129 Hypertensive chronic kidney disease with stage 1 through stage 4 chronic kidney disease, or unspecified chronic kidney disease: Secondary | ICD-10-CM | POA: Insufficient documentation

## 2021-05-26 DIAGNOSIS — Z8249 Family history of ischemic heart disease and other diseases of the circulatory system: Secondary | ICD-10-CM | POA: Insufficient documentation

## 2021-05-26 DIAGNOSIS — R197 Diarrhea, unspecified: Secondary | ICD-10-CM | POA: Diagnosis not present

## 2021-05-26 DIAGNOSIS — R1110 Vomiting, unspecified: Secondary | ICD-10-CM | POA: Insufficient documentation

## 2021-05-26 DIAGNOSIS — Z96653 Presence of artificial knee joint, bilateral: Secondary | ICD-10-CM | POA: Diagnosis not present

## 2021-05-26 NOTE — Discharge Instructions (Signed)

## 2021-05-26 NOTE — ED Triage Notes (Signed)
PT reports cough for 1 week.   Diarrhea and vomiting started yesterday.

## 2021-05-26 NOTE — ED Provider Notes (Signed)
[place]    CSN: [id number] Arrival date & time: 05/26/21  1638      History   Chief Complaint Chief Complaint  Patient presents with   Cough   Diarrhea    HPI Marvin Garcia is a 65 y.o. male.   Patient here for evaluation of cough, congestion, body aches, diarrhea, and vomiting that has been ongoing since Friday.  Patient reports having cold-like symptoms approximately 2 weeks ago but states the symptoms resolved prior to development of symptoms at this time.  Reports taking Mucinex with some symptom relief.  Denies any trauma, injury, or other precipitating event.  Denies any fevers, chest pain, shortness of breath, numbness, tingling, weakness, abdominal pain, or headaches.    The history is provided by the patient.  Cough Associated symptoms: myalgias   Diarrhea Associated symptoms: myalgias    Past Medical History:  Diagnosis Date   Arrhythmia 1991   patient  describes that he woke up surrounded by doctors saying that his "heart had stopped while sleeping"; denies having pain during this event; also denies any reccurrent issues after that episode; says that was "when they told me i had high blood pressure"    Arthritis    Chronic kidney disease    stage IV managed by Dr Margo Aye kidney    Headache    Hypertension    Umbilical hernia     Patient Active Problem List   Diagnosis Date Noted   Osteoarthritis of right knee 10/14/2019   Pain in right knee 09/13/2019   History of total hip arthroplasty 04/08/2018   Hypertensive disorder 09/30/2017   Kidney disease 09/30/2017   Pain of left hip joint 08/13/2017   Osteoarthritis of left hip 03/19/2017    Past Surgical History:  Procedure Laterality Date   HERNIA REPAIR     inguinal    INSERTION OF MESH N/A 01/21/2017   Procedure: INSERTION OF MESH;  Surgeon: Clovis Riley, MD;  Location: [facility];  Service: General;  Laterality: N/A;   JOINT REPLACEMENT     KNEE ARTHROSCOPY     TOTAL  HIP ARTHROPLASTY Left 03/19/2017   Procedure: LEFT TOTAL HIP ARTHROPLASTY ANTERIOR APPROACH;  Surgeon: Rod Can, MD;  Location: [facility];  Service: Orthopedics;  Laterality: Left;  Needs RNFA   TOTAL KNEE ARTHROPLASTY  06/04/2012   Procedure: TOTAL KNEE ARTHROPLASTY;  Surgeon: Sydnee Cabal, MD;  Location: [facility];  Service: Orthopedics;  Laterality: Left;   TOTAL KNEE ARTHROPLASTY Right 10/14/2019   Procedure: TOTAL KNEE ARTHROPLASTY;  Surgeon: Sydnee Cabal, MD;  Location: [facility];  Service: Orthopedics;  Laterality: Right;  adductor canal   UMBILICAL HERNIA REPAIR N/A 01/21/2017   Procedure: UMBILICAL HERNIA REPAIR WITH MESH;  Surgeon: Clovis Riley, MD;  Location: [facility];  Service: General;  Laterality: N/A;       Home Medications    Prior to Admission medications   Medication Sig Start Date End Date Taking? Authorizing Provider  amLODipine (NORVASC) 10 MG tablet Take 10 mg by mouth at bedtime.  10/03/19  Yes [provider]  Ascorbic Acid (VITAMIN C) 1000 MG tablet Take 1,000 mg by mouth daily.   Yes [provider]  aspirin 81 MG chewable tablet Chew 1 tablet (81 mg total) by mouth 2 (two) times daily. Patient taking differently: Chew 81 mg by mouth daily. 03/20/17  Yes Swinteck, Aaron Edelman, MD  atorvastatin (LIPITOR) 20 MG tablet Take 20 mg by mouth every evening.    Yes  [provider]  calcitRIOL (ROCALTROL) 0.25 MCG capsule Take 0.25 mcg by mouth every other day.   Yes [provider]  carvedilol (COREG) 25 MG tablet Take 25 mg by mouth 2 (two) times daily with a meal.   Yes [provider]  cloNIDine (CATAPRES) 0.3 MG tablet Take 0.3 mg by mouth in the morning, at noon, and at bedtime.   Yes [provider]  Febuxostat 80 MG TABS Take 80 mg by mouth daily.    Yes [provider]  furosemide (LASIX) 40 MG tablet Take 40-80 mg by mouth See admin instructions. Take 80 mg by mouth every morning and take 40 mg by mouth every  evening   Yes [provider]  hydrALAZINE (APRESOLINE) 100 MG tablet Take 100 mg by mouth 3 (three) times daily.   Yes [provider]  irbesartan (AVAPRO) 150 MG tablet Take 150 mg by mouth daily.   Yes [provider]  isosorbide mononitrate (IMDUR) 30 MG 24 hr tablet Take 30 mg by mouth daily. 09/08/19  Yes [provider]  Omega-3 Fatty Acids (OMEGA 3 500) 500 MG CAPS Take 500 mg by mouth daily.   Yes [provider]  acetaminophen (TYLENOL) 650 MG CR tablet Take 650-1,300 mg by mouth every 8 (eight) hours as needed for pain.    [provider]  Coenzyme Q10 (COQ-10) 100 MG CAPS Take 100 mg by mouth daily.    [provider]  colchicine 0.6 MG tablet Take 0.6 mg by mouth daily as needed (for gout flare up).    [provider]  ferrous sulfate 325 (65 FE) MG tablet Take 325 mg by mouth daily.    [provider]  methocarbamol (ROBAXIN) 500 MG tablet Take 1 tablet (500 mg total) by mouth 4 (four) times daily. 10/14/19   Drue Novel, PA  Multiple Vitamin (MULTIVITAMIN WITH MINERALS) TABS tablet Take 1 tablet by mouth daily.    [provider]    Family History Family History  Problem Relation Age of Onset   Diabetes Mother    Heart disease Father    Asthma Son    Diabetes Maternal Grandmother     Social History Social History   Tobacco Use   Smoking status: Never   Smokeless tobacco: Never  Vaping Use   Vaping Use: Never used  Substance Use Topics   Alcohol use: No   Drug use: No     Allergies   Hydrocodone   Review of Systems Review of Systems  Constitutional:  Positive for fatigue.  HENT:  Positive for congestion.   Respiratory:  Positive for cough.   Gastrointestinal:  Positive for diarrhea.  Musculoskeletal:  Positive for myalgias.  All other systems reviewed and are negative.   Physical Exam Triage Vital Signs ED Triage Vitals  Enc Vitals Group     BP 05/26/21 1748  (!) 171/98     Pulse Rate 05/26/21 1748 68     Resp 05/26/21 1748 16     Temp 05/26/21 1748 98.4 F (36.9 C)     Temp Source 05/26/21 1748 Oral     SpO2 05/26/21 1748 94 %     Weight --      Height --      Head Circumference --      Peak Flow --      Pain Score 05/26/21 1746 4     Pain Loc --      Pain Edu? --  Excl. in GC? --    No data found.  Updated Vital Signs BP (!) 171/98   Pulse 68   Temp 98.4 F (36.9 C) (Oral)   Resp 16   SpO2 94%   Visual Acuity Right Eye Distance:   Left Eye Distance:   Bilateral Distance:    Right Eye Near:   Left Eye Near:    Bilateral Near:     Physical Exam Vitals and nursing note reviewed.  Constitutional:      General: He is not in acute distress.    Appearance: Normal appearance. He is not ill-appearing, toxic-appearing or diaphoretic.  HENT:     Head: Normocephalic and atraumatic.     Nose: Congestion present. No rhinorrhea.     Mouth/Throat:     Mouth: Mucous membranes are moist.     Pharynx: Oropharynx is clear. Posterior oropharyngeal erythema present. No oropharyngeal exudate.  Eyes:     Conjunctiva/sclera: Conjunctivae normal.  Cardiovascular:     Rate and Rhythm: Normal rate and regular rhythm.     Pulses: Normal pulses.     Heart sounds: Normal heart sounds.  Pulmonary:     Effort: Pulmonary effort is normal.     Breath sounds: Normal breath sounds.  Abdominal:     General: Abdomen is flat. Bowel sounds are normal.     Palpations: Abdomen is soft.  Musculoskeletal:        General: Normal range of motion.     Cervical back: Normal range of motion.  Skin:    General: Skin is warm and dry.  Neurological:     General: No focal deficit present.     Mental Status: He is alert and oriented to person, place, and time.  Psychiatric:        Mood and Affect: Mood normal.     UC Treatments / Results  Labs (all labs ordered are listed, but only abnormal results are displayed) Labs Reviewed  SARS CORONAVIRUS  2 (TAT 6-24 HRS)    EKG   Radiology No results found.  Procedures Procedures (including critical care time)  Medications Ordered in UC Medications - No data to display  Initial Impression / Assessment and Plan / UC Course  I have reviewed the triage vital signs and the nursing notes.  Pertinent labs & imaging results that were available during my care of the patient were reviewed by me and considered in my medical decision making (see chart for details).    Assessment negative for red flags or concerns.  Likely viral URI with cough.  COVID test pending.  Discussed current CDC guidelines for quarantine and work note given to patient.  Tylenol and or ibuprofen as needed.  Encourage fluids and rest.  Discussed conservative symptom management as described in discharge instructions.  Follow-up with primary care for reevaluation of soon as possible. Final Clinical Impressions(s) / UC Diagnoses   Final diagnoses:  Viral URI with cough     Discharge Instructions      We will contact you if your COVID test is positive.  Please quarantine while you wait for the results.  If your test is negative you may resume normal activities.  If your test is positive please continue to quarantine for at least 5 days from your symptom onset or until you are without a fever for at least 24 hours after the medications.  You can take Tylenol and/or Ibuprofen as needed for fever reduction and pain relief.   For cough: honey 1/2  to 1 teaspoon (you can dilute the honey in water or another fluid).  You can also use guaifenesin and dextromethorphan for cough. You can use a humidifier for chest congestion and cough.  If you don't have a humidifier, you can sit in the bathroom with the hot shower running.     For sore throat: try warm salt water gargles, cepacol lozenges, throat spray, warm tea or water with lemon/honey, popsicles or ice, or OTC cold relief medicine for throat discomfort.    For congestion:  take a daily anti-histamine like Zyrtec, Claritin, and a oral decongestant, such as pseudoephedrine.  You can also use Flonase 1-2 sprays in each nostril daily.    It is important to stay hydrated: drink plenty of fluids (water, gatorade/powerade/pedialyte, juices, or teas) to keep your throat moisturized and help further relieve irritation/discomfort.   Return or go to the Emergency Department if symptoms worsen or do not improve in the next few days.      ED Prescriptions   None    PDMP not reviewed this encounter.   Pearson Forster, NP 05/26/21 [phone]

## 2021-05-27 LAB — SARS CORONAVIRUS 2 (TAT 6-24 HRS): SARS Coronavirus 2: NEGATIVE

## 2021-06-20 ENCOUNTER — Ambulatory Visit
Admission: RE | Admit: 2021-06-20 | Discharge: 2021-06-20 | Disposition: A | Discharge status: Home / Self Care | Payer: Self-pay | Source: Ambulatory Visit | Attending: Nurse Practitioner | Admitting: Nurse Practitioner

## 2021-06-20 ENCOUNTER — Other Ambulatory Visit: Payer: Self-pay

## 2021-06-20 ENCOUNTER — Other Ambulatory Visit: Payer: Self-pay | Admitting: Nurse Practitioner

## 2021-06-20 DIAGNOSIS — W19XXXA Unspecified fall, initial encounter: Secondary | ICD-10-CM

## 2021-07-09 ENCOUNTER — Other Ambulatory Visit (HOSPITAL_COMMUNITY): Payer: Self-pay | Admitting: *Deleted

## 2021-07-10 ENCOUNTER — Other Ambulatory Visit: Payer: Self-pay

## 2021-07-10 ENCOUNTER — Ambulatory Visit (HOSPITAL_COMMUNITY)
Admission: RE | Admit: 2021-07-10 | Discharge: 2021-07-10 | Disposition: A | Discharge status: Home / Self Care | Payer: 59 | Source: Ambulatory Visit | Attending: Nephrology | Admitting: Nephrology

## 2021-07-10 DIAGNOSIS — N189 Chronic kidney disease, unspecified: Secondary | ICD-10-CM | POA: Diagnosis not present

## 2021-07-10 DIAGNOSIS — D631 Anemia in chronic kidney disease: Secondary | ICD-10-CM | POA: Insufficient documentation

## 2021-07-10 MED ORDER — SODIUM CHLORIDE 0.9 % IV SOLN
510.0000 mg | INTRAVENOUS | Status: DC
  Administered 2021-07-10: 510 mg via INTRAVENOUS
  Filled 2021-07-10: qty 510

## 2021-07-17 ENCOUNTER — Other Ambulatory Visit: Payer: Self-pay

## 2021-07-17 ENCOUNTER — Ambulatory Visit (HOSPITAL_COMMUNITY)
Admission: RE | Admit: 2021-07-17 | Discharge: 2021-07-17 | Disposition: A | Discharge status: Home / Self Care | Payer: 59 | Source: Ambulatory Visit | Attending: Nephrology | Admitting: Nephrology

## 2021-07-17 DIAGNOSIS — N189 Chronic kidney disease, unspecified: Secondary | ICD-10-CM | POA: Insufficient documentation

## 2021-07-17 DIAGNOSIS — D631 Anemia in chronic kidney disease: Secondary | ICD-10-CM | POA: Diagnosis present

## 2021-07-17 MED ORDER — SODIUM CHLORIDE 0.9 % IV SOLN
510.0000 mg | INTRAVENOUS | Status: DC
  Administered 2021-07-17: 510 mg via INTRAVENOUS
  Filled 2021-07-17: qty 510

## 2022-09-10 DIAGNOSIS — N185 Chronic kidney disease, stage 5: Secondary | ICD-10-CM | POA: Diagnosis not present

## 2022-09-10 DIAGNOSIS — D649 Anemia, unspecified: Secondary | ICD-10-CM | POA: Diagnosis not present

## 2022-09-10 DIAGNOSIS — E669 Obesity, unspecified: Secondary | ICD-10-CM | POA: Diagnosis not present

## 2022-09-10 DIAGNOSIS — N2581 Secondary hyperparathyroidism of renal origin: Secondary | ICD-10-CM | POA: Diagnosis not present

## 2022-09-10 DIAGNOSIS — I120 Hypertensive chronic kidney disease with stage 5 chronic kidney disease or end stage renal disease: Secondary | ICD-10-CM | POA: Diagnosis not present

## 2022-11-12 DIAGNOSIS — I120 Hypertensive chronic kidney disease with stage 5 chronic kidney disease or end stage renal disease: Secondary | ICD-10-CM | POA: Diagnosis not present

## 2022-11-12 DIAGNOSIS — E669 Obesity, unspecified: Secondary | ICD-10-CM | POA: Diagnosis not present

## 2022-11-12 DIAGNOSIS — D649 Anemia, unspecified: Secondary | ICD-10-CM | POA: Diagnosis not present

## 2022-11-12 DIAGNOSIS — N185 Chronic kidney disease, stage 5: Secondary | ICD-10-CM | POA: Diagnosis not present

## 2022-11-12 DIAGNOSIS — N2581 Secondary hyperparathyroidism of renal origin: Secondary | ICD-10-CM | POA: Diagnosis not present

## 2023-02-18 DIAGNOSIS — N2581 Secondary hyperparathyroidism of renal origin: Secondary | ICD-10-CM | POA: Diagnosis not present

## 2023-02-18 DIAGNOSIS — D649 Anemia, unspecified: Secondary | ICD-10-CM | POA: Diagnosis not present

## 2023-02-18 DIAGNOSIS — E669 Obesity, unspecified: Secondary | ICD-10-CM | POA: Diagnosis not present

## 2023-02-18 DIAGNOSIS — I120 Hypertensive chronic kidney disease with stage 5 chronic kidney disease or end stage renal disease: Secondary | ICD-10-CM | POA: Diagnosis not present

## 2023-02-18 DIAGNOSIS — N185 Chronic kidney disease, stage 5: Secondary | ICD-10-CM | POA: Diagnosis not present

## 2023-03-06 ENCOUNTER — Other Ambulatory Visit (HOSPITAL_COMMUNITY): Payer: Self-pay | Admitting: *Deleted

## 2023-03-10 ENCOUNTER — Encounter (HOSPITAL_COMMUNITY): Payer: Self-pay

## 2023-03-10 ENCOUNTER — Ambulatory Visit (HOSPITAL_COMMUNITY)
Admission: RE | Admit: 2023-03-10 | Discharge: 2023-03-10 | Disposition: A | Discharge status: Home / Self Care | Payer: PPO | Source: Ambulatory Visit | Attending: Nephrology | Admitting: Nephrology

## 2023-03-10 VITALS — BP 120/76 | HR 66 | Temp 97.2°F | Resp 17

## 2023-03-10 DIAGNOSIS — N289 Disorder of kidney and ureter, unspecified: Secondary | ICD-10-CM | POA: Diagnosis not present

## 2023-03-10 MED ORDER — EPOETIN ALFA-EPBX 10000 UNIT/ML IJ SOLN: 20000.0000 [IU] | INTRAMUSCULAR | Status: DC

## 2023-03-10 MED ORDER — EPOETIN ALFA 20000 UNIT/ML IJ SOLN
INTRAMUSCULAR | Status: AC
  Administered 2023-03-10: 20000 [IU]
  Filled 2023-03-10: qty 1

## 2023-03-10 NOTE — Progress Notes (Signed)
Pt here for first retacrit injection.  HGB 7.4 via hemocue.  Pt denies bleeding, chest pain or shortness of breath.  Amber at Dr Beaulah Corin office notified.  Okay to proceed with med as ordered

## 2023-03-17 DIAGNOSIS — N184 Chronic kidney disease, stage 4 (severe): Secondary | ICD-10-CM | POA: Diagnosis not present

## 2023-03-17 DIAGNOSIS — R972 Elevated prostate specific antigen [PSA]: Secondary | ICD-10-CM | POA: Diagnosis not present

## 2023-03-17 DIAGNOSIS — E669 Obesity, unspecified: Secondary | ICD-10-CM | POA: Diagnosis not present

## 2023-03-17 DIAGNOSIS — M109 Gout, unspecified: Secondary | ICD-10-CM | POA: Diagnosis not present

## 2023-03-17 DIAGNOSIS — Z0000 Encounter for general adult medical examination without abnormal findings: Secondary | ICD-10-CM | POA: Diagnosis not present

## 2023-03-17 DIAGNOSIS — Z1159 Encounter for screening for other viral diseases: Secondary | ICD-10-CM | POA: Diagnosis not present

## 2023-03-17 DIAGNOSIS — I10 Essential (primary) hypertension: Secondary | ICD-10-CM | POA: Diagnosis not present

## 2023-03-17 DIAGNOSIS — E782 Mixed hyperlipidemia: Secondary | ICD-10-CM | POA: Diagnosis not present

## 2023-03-24 ENCOUNTER — Encounter (HOSPITAL_COMMUNITY)
Admission: RE | Admit: 2023-03-24 | Discharge: 2023-03-24 | Disposition: A | Discharge status: Home / Self Care | Payer: PPO | Source: Ambulatory Visit | Attending: Nephrology | Admitting: Nephrology

## 2023-03-24 VITALS — BP 131/77 | HR 54 | Temp 98.2°F | Resp 17 | Wt 192.0 lb

## 2023-03-24 DIAGNOSIS — N289 Disorder of kidney and ureter, unspecified: Secondary | ICD-10-CM | POA: Diagnosis not present

## 2023-03-24 LAB — POCT HEMOGLOBIN-HEMACUE: Hemoglobin: 7.8 g/dL — ABNORMAL LOW (ref 13.0–17.0)

## 2023-03-24 MED ORDER — EPOETIN ALFA-EPBX 10000 UNIT/ML IJ SOLN
INTRAMUSCULAR | Status: AC
  Filled 2023-03-24: qty 2

## 2023-03-24 MED ORDER — SODIUM CHLORIDE 0.9 % IV SOLN
510.0000 mg | INTRAVENOUS | Status: DC
  Administered 2023-03-24: 510 mg via INTRAVENOUS
  Filled 2023-03-24: qty 510

## 2023-03-24 MED ORDER — EPOETIN ALFA-EPBX 10000 UNIT/ML IJ SOLN
20000.0000 [IU] | INTRAMUSCULAR | Status: DC
  Administered 2023-03-24: 20000 [IU] via SUBCUTANEOUS

## 2023-03-24 NOTE — Progress Notes (Signed)
Pt here for retacrit injection and feraheme infusion.  HGB today via hemocue 7.8.  Pt denies bleeding, shortness of breath or chest pain.  Amber at Dr Beaulah Corin office notified.  No new orders received

## 2023-04-07 ENCOUNTER — Encounter (HOSPITAL_COMMUNITY)
Admission: RE | Admit: 2023-04-07 | Discharge: 2023-04-07 | Disposition: A | Discharge status: Home / Self Care | Payer: PPO | Source: Ambulatory Visit | Attending: Nephrology | Admitting: Nephrology

## 2023-04-07 VITALS — BP 119/74 | HR 58 | Temp 97.9°F | Resp 17 | Wt 192.0 lb

## 2023-04-07 DIAGNOSIS — N289 Disorder of kidney and ureter, unspecified: Secondary | ICD-10-CM | POA: Diagnosis not present

## 2023-04-07 LAB — POCT HEMOGLOBIN-HEMACUE: Hemoglobin: 8.4 g/dL — ABNORMAL LOW (ref 13.0–17.0)

## 2023-04-07 MED ORDER — SODIUM CHLORIDE 0.9 % IV SOLN
510.0000 mg | INTRAVENOUS | Status: DC
  Administered 2023-04-07: 510 mg via INTRAVENOUS
  Filled 2023-04-07: qty 510

## 2023-04-07 MED ORDER — EPOETIN ALFA-EPBX 10000 UNIT/ML IJ SOLN
INTRAMUSCULAR | Status: AC
  Administered 2023-04-07: 20000 [IU] via SUBCUTANEOUS
  Filled 2023-04-07: qty 2

## 2023-04-07 MED ORDER — EPOETIN ALFA-EPBX 10000 UNIT/ML IJ SOLN: 20000.0000 [IU] | INTRAMUSCULAR | Status: DC

## 2023-04-21 ENCOUNTER — Ambulatory Visit (HOSPITAL_COMMUNITY)
Admission: RE | Admit: 2023-04-21 | Discharge: 2023-04-21 | Disposition: A | Discharge status: Home / Self Care | Payer: PPO | Source: Ambulatory Visit | Attending: Nephrology | Admitting: Nephrology

## 2023-04-21 VITALS — BP 122/72 | HR 75 | Temp 97.3°F | Resp 18

## 2023-04-21 DIAGNOSIS — N189 Chronic kidney disease, unspecified: Secondary | ICD-10-CM | POA: Diagnosis not present

## 2023-04-21 DIAGNOSIS — N289 Disorder of kidney and ureter, unspecified: Secondary | ICD-10-CM | POA: Diagnosis present

## 2023-04-21 DIAGNOSIS — D631 Anemia in chronic kidney disease: Secondary | ICD-10-CM | POA: Diagnosis not present

## 2023-04-21 DIAGNOSIS — Z79890 Hormone replacement therapy (postmenopausal): Secondary | ICD-10-CM | POA: Diagnosis not present

## 2023-04-21 LAB — IRON AND TIBC
Iron: 41 ug/dL — ABNORMAL LOW (ref 45–182)
Saturation Ratios: 15 % — ABNORMAL LOW (ref 17.9–39.5)
TIBC: 272 ug/dL (ref 250–450)
UIBC: 231 ug/dL

## 2023-04-21 LAB — POCT HEMOGLOBIN-HEMACUE: Hemoglobin: 8.8 g/dL — ABNORMAL LOW (ref 13.0–17.0)

## 2023-04-21 LAB — FERRITIN: Ferritin: 621 ng/mL — ABNORMAL HIGH (ref 24–336)

## 2023-04-21 MED ORDER — EPOETIN ALFA-EPBX 10000 UNIT/ML IJ SOLN
20000.0000 [IU] | INTRAMUSCULAR | Status: DC
  Administered 2023-04-21: 20000 [IU] via SUBCUTANEOUS

## 2023-04-21 MED ORDER — EPOETIN ALFA-EPBX 10000 UNIT/ML IJ SOLN
INTRAMUSCULAR | Status: AC
  Filled 2023-04-21: qty 2

## 2023-05-05 ENCOUNTER — Encounter (HOSPITAL_COMMUNITY)
Admission: RE | Admit: 2023-05-05 | Discharge: 2023-05-05 | Disposition: A | Discharge status: Home / Self Care | Payer: PPO | Source: Ambulatory Visit | Attending: Nephrology | Admitting: Nephrology

## 2023-05-05 VITALS — BP 131/75 | HR 62 | Temp 97.2°F | Resp 17

## 2023-05-05 DIAGNOSIS — N289 Disorder of kidney and ureter, unspecified: Secondary | ICD-10-CM | POA: Diagnosis not present

## 2023-05-05 LAB — POCT HEMOGLOBIN-HEMACUE: Hemoglobin: 9.6 g/dL — ABNORMAL LOW (ref 13.0–17.0)

## 2023-05-05 MED ORDER — EPOETIN ALFA-EPBX 10000 UNIT/ML IJ SOLN
20000.0000 [IU] | INTRAMUSCULAR | Status: DC
  Administered 2023-05-05: 20000 [IU] via SUBCUTANEOUS

## 2023-05-05 MED ORDER — EPOETIN ALFA-EPBX 10000 UNIT/ML IJ SOLN
INTRAMUSCULAR | Status: AC
  Filled 2023-05-05: qty 2

## 2023-05-19 ENCOUNTER — Encounter (HOSPITAL_COMMUNITY)
Admission: RE | Admit: 2023-05-19 | Discharge: 2023-05-19 | Disposition: A | Discharge status: Home / Self Care | Payer: PPO | Source: Ambulatory Visit | Attending: Nephrology | Admitting: Nephrology

## 2023-05-19 VITALS — BP 133/74 | HR 59 | Temp 97.8°F | Resp 16

## 2023-05-19 DIAGNOSIS — R809 Proteinuria, unspecified: Secondary | ICD-10-CM | POA: Diagnosis not present

## 2023-05-19 DIAGNOSIS — D649 Anemia, unspecified: Secondary | ICD-10-CM | POA: Diagnosis not present

## 2023-05-19 DIAGNOSIS — N185 Chronic kidney disease, stage 5: Secondary | ICD-10-CM | POA: Diagnosis not present

## 2023-05-19 DIAGNOSIS — N2581 Secondary hyperparathyroidism of renal origin: Secondary | ICD-10-CM | POA: Diagnosis not present

## 2023-05-19 DIAGNOSIS — I120 Hypertensive chronic kidney disease with stage 5 chronic kidney disease or end stage renal disease: Secondary | ICD-10-CM | POA: Diagnosis not present

## 2023-05-19 DIAGNOSIS — N289 Disorder of kidney and ureter, unspecified: Secondary | ICD-10-CM | POA: Insufficient documentation

## 2023-05-19 LAB — POCT HEMOGLOBIN-HEMACUE: Hemoglobin: 10.6 g/dL — ABNORMAL LOW (ref 13.0–17.0)

## 2023-05-19 MED ORDER — EPOETIN ALFA-EPBX 10000 UNIT/ML IJ SOLN
INTRAMUSCULAR | Status: AC
  Filled 2023-05-19: qty 2

## 2023-05-19 MED ORDER — EPOETIN ALFA-EPBX 10000 UNIT/ML IJ SOLN
20000.0000 [IU] | INTRAMUSCULAR | Status: DC
  Administered 2023-05-19: 20000 [IU] via SUBCUTANEOUS

## 2023-06-02 ENCOUNTER — Ambulatory Visit (HOSPITAL_COMMUNITY)
Admission: RE | Admit: 2023-06-02 | Discharge: 2023-06-02 | Disposition: A | Discharge status: Home / Self Care | Payer: PPO | Source: Ambulatory Visit | Attending: Nephrology | Admitting: Nephrology

## 2023-06-02 VITALS — BP 128/78 | HR 60 | Temp 97.2°F | Resp 16

## 2023-06-02 DIAGNOSIS — N289 Disorder of kidney and ureter, unspecified: Secondary | ICD-10-CM | POA: Insufficient documentation

## 2023-06-02 LAB — POCT HEMOGLOBIN-HEMACUE: Hemoglobin: 10.3 g/dL — ABNORMAL LOW (ref 13.0–17.0)

## 2023-06-02 MED ORDER — EPOETIN ALFA-EPBX 10000 UNIT/ML IJ SOLN
20000.0000 [IU] | INTRAMUSCULAR | Status: DC
  Administered 2023-06-02: 20000 [IU] via SUBCUTANEOUS

## 2023-06-02 MED ORDER — EPOETIN ALFA-EPBX 10000 UNIT/ML IJ SOLN
INTRAMUSCULAR | Status: AC
  Filled 2023-06-02: qty 2

## 2023-06-12 DIAGNOSIS — E669 Obesity, unspecified: Secondary | ICD-10-CM | POA: Diagnosis not present

## 2023-06-12 DIAGNOSIS — N184 Chronic kidney disease, stage 4 (severe): Secondary | ICD-10-CM | POA: Diagnosis not present

## 2023-06-12 DIAGNOSIS — Z6830 Body mass index (BMI) 30.0-30.9, adult: Secondary | ICD-10-CM | POA: Diagnosis not present

## 2023-06-12 DIAGNOSIS — M1A09X0 Idiopathic chronic gout, multiple sites, without tophus (tophi): Secondary | ICD-10-CM | POA: Diagnosis not present

## 2023-06-16 ENCOUNTER — Encounter (HOSPITAL_COMMUNITY)
Admission: RE | Admit: 2023-06-16 | Discharge: 2023-06-16 | Disposition: A | Discharge status: Home / Self Care | Payer: PPO | Source: Ambulatory Visit | Attending: Nephrology | Admitting: Nephrology

## 2023-06-16 VITALS — BP 132/82 | HR 53 | Temp 97.2°F | Resp 16

## 2023-06-16 DIAGNOSIS — D631 Anemia in chronic kidney disease: Secondary | ICD-10-CM | POA: Diagnosis not present

## 2023-06-16 DIAGNOSIS — N289 Disorder of kidney and ureter, unspecified: Secondary | ICD-10-CM

## 2023-06-16 DIAGNOSIS — N185 Chronic kidney disease, stage 5: Secondary | ICD-10-CM | POA: Insufficient documentation

## 2023-06-16 LAB — FERRITIN: Ferritin: 718 ng/mL — ABNORMAL HIGH (ref 24–336)

## 2023-06-16 LAB — IRON AND TIBC
Iron: 36 ug/dL — ABNORMAL LOW (ref 45–182)
Saturation Ratios: 13 % — ABNORMAL LOW (ref 17.9–39.5)
TIBC: 272 ug/dL (ref 250–450)
UIBC: 236 ug/dL

## 2023-06-16 LAB — POCT HEMOGLOBIN-HEMACUE: Hemoglobin: 10.1 g/dL — ABNORMAL LOW (ref 13.0–17.0)

## 2023-06-16 MED ORDER — EPOETIN ALFA-EPBX 10000 UNIT/ML IJ SOLN
INTRAMUSCULAR | Status: AC
  Filled 2023-06-16: qty 2

## 2023-06-16 MED ORDER — EPOETIN ALFA-EPBX 10000 UNIT/ML IJ SOLN
20000.0000 [IU] | INTRAMUSCULAR | Status: DC
Start: 2023-06-16 — End: 2037-06-12
  Administered 2023-06-16: 20000 [IU] via SUBCUTANEOUS

## 2023-06-24 DIAGNOSIS — D649 Anemia, unspecified: Secondary | ICD-10-CM | POA: Diagnosis not present

## 2023-06-24 DIAGNOSIS — N185 Chronic kidney disease, stage 5: Secondary | ICD-10-CM | POA: Diagnosis not present

## 2023-06-24 DIAGNOSIS — N2581 Secondary hyperparathyroidism of renal origin: Secondary | ICD-10-CM | POA: Diagnosis not present

## 2023-06-24 DIAGNOSIS — I120 Hypertensive chronic kidney disease with stage 5 chronic kidney disease or end stage renal disease: Secondary | ICD-10-CM | POA: Diagnosis not present

## 2023-06-30 ENCOUNTER — Encounter (HOSPITAL_COMMUNITY): Payer: PPO

## 2023-06-30 ENCOUNTER — Encounter (HOSPITAL_COMMUNITY)
Admission: RE | Admit: 2023-06-30 | Discharge: 2023-06-30 | Disposition: A | Discharge status: Home / Self Care | Payer: PPO | Source: Ambulatory Visit | Attending: Nephrology | Admitting: Nephrology

## 2023-06-30 VITALS — BP 131/85 | HR 64 | Temp 97.3°F | Resp 16

## 2023-06-30 DIAGNOSIS — N289 Disorder of kidney and ureter, unspecified: Secondary | ICD-10-CM

## 2023-06-30 DIAGNOSIS — N185 Chronic kidney disease, stage 5: Secondary | ICD-10-CM | POA: Diagnosis not present

## 2023-06-30 LAB — POCT HEMOGLOBIN-HEMACUE: Hemoglobin: 10 g/dL — ABNORMAL LOW (ref 13.0–17.0)

## 2023-06-30 MED ORDER — EPOETIN ALFA-EPBX 10000 UNIT/ML IJ SOLN
20000.0000 [IU] | INTRAMUSCULAR | Status: DC
Start: 2023-06-30 — End: 2023-07-01
  Administered 2023-06-30: 20000 [IU] via SUBCUTANEOUS

## 2023-06-30 MED ORDER — EPOETIN ALFA-EPBX 10000 UNIT/ML IJ SOLN
INTRAMUSCULAR | Status: AC
  Filled 2023-06-30: qty 2

## 2023-07-14 ENCOUNTER — Encounter (HOSPITAL_COMMUNITY)
Admission: RE | Admit: 2023-07-14 | Discharge: 2023-07-14 | Disposition: A | Discharge status: Home / Self Care | Payer: PPO | Source: Ambulatory Visit | Attending: Nephrology | Admitting: Nephrology

## 2023-07-14 VITALS — BP 136/81 | HR 64 | Temp 97.1°F | Resp 16

## 2023-07-14 DIAGNOSIS — D631 Anemia in chronic kidney disease: Secondary | ICD-10-CM | POA: Diagnosis not present

## 2023-07-14 DIAGNOSIS — N289 Disorder of kidney and ureter, unspecified: Secondary | ICD-10-CM | POA: Diagnosis present

## 2023-07-14 DIAGNOSIS — Z79890 Hormone replacement therapy (postmenopausal): Secondary | ICD-10-CM | POA: Diagnosis not present

## 2023-07-14 DIAGNOSIS — N185 Chronic kidney disease, stage 5: Secondary | ICD-10-CM | POA: Insufficient documentation

## 2023-07-14 LAB — FERRITIN: Ferritin: 675 ng/mL — ABNORMAL HIGH (ref 24–336)

## 2023-07-14 LAB — IRON AND TIBC
Iron: 47 ug/dL (ref 45–182)
Saturation Ratios: 16 % — ABNORMAL LOW (ref 17.9–39.5)
TIBC: 304 ug/dL (ref 250–450)
UIBC: 257 ug/dL

## 2023-07-14 LAB — POCT HEMOGLOBIN-HEMACUE: Hemoglobin: 10.3 g/dL — ABNORMAL LOW (ref 13.0–17.0)

## 2023-07-14 MED ORDER — EPOETIN ALFA-EPBX 10000 UNIT/ML IJ SOLN
20000.0000 [IU] | INTRAMUSCULAR | Status: DC
Start: 2023-07-14 — End: 2037-07-10
  Administered 2023-07-14: 20000 [IU] via SUBCUTANEOUS

## 2023-07-14 MED ORDER — EPOETIN ALFA-EPBX 10000 UNIT/ML IJ SOLN
INTRAMUSCULAR | Status: AC
  Filled 2023-07-14: qty 2

## 2023-07-28 ENCOUNTER — Encounter (HOSPITAL_COMMUNITY)
Admission: RE | Admit: 2023-07-28 | Discharge: 2023-07-28 | Disposition: A | Discharge status: Home / Self Care | Payer: PPO | Source: Ambulatory Visit | Attending: Nephrology | Admitting: Nephrology

## 2023-07-28 VITALS — BP 134/81 | HR 61

## 2023-07-28 DIAGNOSIS — N185 Chronic kidney disease, stage 5: Secondary | ICD-10-CM | POA: Diagnosis not present

## 2023-07-28 DIAGNOSIS — N289 Disorder of kidney and ureter, unspecified: Secondary | ICD-10-CM

## 2023-07-28 LAB — POCT HEMOGLOBIN-HEMACUE: Hemoglobin: 9.7 g/dL — ABNORMAL LOW (ref 13.0–17.0)

## 2023-07-28 MED ORDER — EPOETIN ALFA-EPBX 10000 UNIT/ML IJ SOLN
20000.0000 [IU] | INTRAMUSCULAR | Status: DC
  Administered 2023-07-28: 20000 [IU] via SUBCUTANEOUS

## 2023-07-28 MED ORDER — EPOETIN ALFA-EPBX 10000 UNIT/ML IJ SOLN
INTRAMUSCULAR | Status: AC
  Filled 2023-07-28: qty 2

## 2023-08-11 ENCOUNTER — Encounter (HOSPITAL_COMMUNITY)
Admission: RE | Admit: 2023-08-11 | Discharge: 2023-08-11 | Disposition: A | Discharge status: Home / Self Care | Payer: PPO | Source: Ambulatory Visit | Attending: Nephrology | Admitting: Nephrology

## 2023-08-11 VITALS — BP 140/78 | HR 62 | Temp 97.4°F | Resp 17

## 2023-08-11 DIAGNOSIS — N185 Chronic kidney disease, stage 5: Secondary | ICD-10-CM | POA: Diagnosis not present

## 2023-08-11 DIAGNOSIS — N289 Disorder of kidney and ureter, unspecified: Secondary | ICD-10-CM

## 2023-08-11 LAB — POCT HEMOGLOBIN-HEMACUE: Hemoglobin: 9.4 g/dL — ABNORMAL LOW (ref 13.0–17.0)

## 2023-08-11 MED ORDER — EPOETIN ALFA-EPBX 10000 UNIT/ML IJ SOLN
20000.0000 [IU] | INTRAMUSCULAR | Status: DC
Start: 2023-08-11 — End: 2037-08-07
  Administered 2023-08-11: 20000 [IU] via SUBCUTANEOUS

## 2023-08-11 MED ORDER — EPOETIN ALFA-EPBX 10000 UNIT/ML IJ SOLN
INTRAMUSCULAR | Status: AC
  Filled 2023-08-11: qty 2

## 2023-08-25 ENCOUNTER — Encounter (HOSPITAL_COMMUNITY)
Admission: RE | Admit: 2023-08-25 | Discharge: 2023-08-25 | Disposition: A | Discharge status: Home / Self Care | Payer: PPO | Source: Ambulatory Visit | Attending: Nephrology | Admitting: Nephrology

## 2023-08-25 VITALS — BP 131/79 | HR 63 | Temp 97.3°F | Resp 17

## 2023-08-25 DIAGNOSIS — N185 Chronic kidney disease, stage 5: Secondary | ICD-10-CM | POA: Diagnosis present

## 2023-08-25 DIAGNOSIS — N289 Disorder of kidney and ureter, unspecified: Secondary | ICD-10-CM | POA: Insufficient documentation

## 2023-08-25 DIAGNOSIS — D631 Anemia in chronic kidney disease: Secondary | ICD-10-CM | POA: Insufficient documentation

## 2023-08-25 LAB — IRON AND TIBC
Iron: 37 ug/dL — ABNORMAL LOW (ref 45–182)
Saturation Ratios: 14 % — ABNORMAL LOW (ref 17.9–39.5)
TIBC: 272 ug/dL (ref 250–450)
UIBC: 235 ug/dL

## 2023-08-25 LAB — FERRITIN: Ferritin: 528 ng/mL — ABNORMAL HIGH (ref 24–336)

## 2023-08-25 LAB — POCT HEMOGLOBIN-HEMACUE: Hemoglobin: 9.4 g/dL — ABNORMAL LOW (ref 13.0–17.0)

## 2023-08-25 MED ORDER — EPOETIN ALFA-EPBX 10000 UNIT/ML IJ SOLN
20000.0000 [IU] | INTRAMUSCULAR | Status: DC
  Administered 2023-08-25: 20000 [IU] via SUBCUTANEOUS

## 2023-08-25 MED ORDER — EPOETIN ALFA-EPBX 10000 UNIT/ML IJ SOLN
INTRAMUSCULAR | Status: AC
  Filled 2023-08-25: qty 2

## 2023-09-08 ENCOUNTER — Encounter (HOSPITAL_COMMUNITY)
Admission: RE | Admit: 2023-09-08 | Discharge: 2023-09-08 | Disposition: A | Discharge status: Home / Self Care | Payer: PPO | Source: Ambulatory Visit | Attending: Nephrology | Admitting: Nephrology

## 2023-09-08 VITALS — BP 137/74 | HR 66 | Temp 97.2°F | Resp 17

## 2023-09-08 DIAGNOSIS — N185 Chronic kidney disease, stage 5: Secondary | ICD-10-CM | POA: Diagnosis not present

## 2023-09-08 DIAGNOSIS — N289 Disorder of kidney and ureter, unspecified: Secondary | ICD-10-CM

## 2023-09-08 LAB — COMPREHENSIVE METABOLIC PANEL
ALT: 8 U/L (ref 0–44)
AST: 10 U/L — ABNORMAL LOW (ref 15–41)
Albumin: 3.7 g/dL (ref 3.5–5.0)
Alkaline Phosphatase: 57 U/L (ref 38–126)
Anion gap: 16 — ABNORMAL HIGH (ref 5–15)
BUN: 122 mg/dL — ABNORMAL HIGH (ref 8–23)
CO2: 14 mmol/L — ABNORMAL LOW (ref 22–32)
Calcium: 9.4 mg/dL (ref 8.9–10.3)
Chloride: 115 mmol/L — ABNORMAL HIGH (ref 98–111)
Creatinine, Ser: 12.04 mg/dL — ABNORMAL HIGH (ref 0.61–1.24)
GFR, Estimated: 4 mL/min — ABNORMAL LOW (ref >60)
Glucose, Bld: 105 mg/dL — ABNORMAL HIGH (ref 70–99)
Potassium: 3.7 mmol/L (ref 3.5–5.1)
Sodium: 145 mmol/L (ref 135–145)
Total Bilirubin: 0.6 mg/dL (ref 0.0–1.2)
Total Protein: 7.1 g/dL (ref 6.5–8.1)

## 2023-09-08 LAB — VITAMIN D 25 HYDROXY (VIT D DEFICIENCY, FRACTURES): Vit D, 25-Hydroxy: 47.28 ng/mL (ref 30–100)

## 2023-09-08 LAB — POCT HEMOGLOBIN-HEMACUE: Hemoglobin: 8.9 g/dL — ABNORMAL LOW (ref 13.0–17.0)

## 2023-09-08 MED ORDER — EPOETIN ALFA-EPBX 40000 UNIT/ML IJ SOLN
INTRAMUSCULAR | Status: AC
  Filled 2023-09-08: qty 1

## 2023-09-08 MED ORDER — EPOETIN ALFA-EPBX 40000 UNIT/ML IJ SOLN
30000.0000 [IU] | INTRAMUSCULAR | Status: DC
Start: 2023-09-08 — End: 2037-09-04
  Administered 2023-09-08: 30000 [IU] via SUBCUTANEOUS

## 2023-09-09 LAB — PTH, INTACT AND CALCIUM
Calcium, Total (PTH): 9.1 mg/dL (ref 8.6–10.2)
PTH: 453 pg/mL — ABNORMAL HIGH (ref 15–65)

## 2023-09-22 ENCOUNTER — Encounter (HOSPITAL_COMMUNITY)
Admission: RE | Admit: 2023-09-22 | Discharge: 2023-09-22 | Disposition: A | Discharge status: Home / Self Care | Payer: PPO | Source: Ambulatory Visit | Attending: Nephrology | Admitting: Nephrology

## 2023-09-22 DIAGNOSIS — N289 Disorder of kidney and ureter, unspecified: Secondary | ICD-10-CM

## 2023-09-22 DIAGNOSIS — N185 Chronic kidney disease, stage 5: Secondary | ICD-10-CM | POA: Insufficient documentation

## 2023-09-22 DIAGNOSIS — D631 Anemia in chronic kidney disease: Secondary | ICD-10-CM | POA: Insufficient documentation

## 2023-09-22 DIAGNOSIS — Z79890 Hormone replacement therapy (postmenopausal): Secondary | ICD-10-CM | POA: Insufficient documentation

## 2023-09-22 LAB — IRON AND TIBC
Iron: 57 ug/dL (ref 45–182)
Saturation Ratios: 22 % (ref 17.9–39.5)
TIBC: 259 ug/dL (ref 250–450)
UIBC: 202 ug/dL

## 2023-09-22 LAB — FERRITIN: Ferritin: 648 ng/mL — ABNORMAL HIGH (ref 24–336)

## 2023-09-22 LAB — POCT HEMOGLOBIN-HEMACUE: Hemoglobin: 9.4 g/dL — ABNORMAL LOW (ref 13.0–17.0)

## 2023-09-22 MED ORDER — EPOETIN ALFA-EPBX 40000 UNIT/ML IJ SOLN
30000.0000 [IU] | INTRAMUSCULAR | Status: DC
  Administered 2023-09-22: 30000 [IU] via SUBCUTANEOUS

## 2023-09-22 MED ORDER — EPOETIN ALFA-EPBX 40000 UNIT/ML IJ SOLN
INTRAMUSCULAR | Status: AC
  Filled 2023-09-22: qty 1

## 2023-10-06 ENCOUNTER — Encounter (HOSPITAL_COMMUNITY)
Admission: RE | Admit: 2023-10-06 | Discharge: 2023-10-06 | Disposition: A | Discharge status: Home / Self Care | Payer: PPO | Source: Ambulatory Visit | Attending: Nephrology | Admitting: Nephrology

## 2023-10-06 VITALS — BP 133/68 | HR 57 | Temp 97.3°F | Resp 17

## 2023-10-06 DIAGNOSIS — N289 Disorder of kidney and ureter, unspecified: Secondary | ICD-10-CM

## 2023-10-06 DIAGNOSIS — N185 Chronic kidney disease, stage 5: Secondary | ICD-10-CM | POA: Diagnosis not present

## 2023-10-06 LAB — POCT HEMOGLOBIN-HEMACUE: Hemoglobin: 9.2 g/dL — ABNORMAL LOW (ref 13.0–17.0)

## 2023-10-06 MED ORDER — EPOETIN ALFA-EPBX 40000 UNIT/ML IJ SOLN
30000.0000 [IU] | INTRAMUSCULAR | Status: DC
  Administered 2023-10-06: 30000 [IU] via SUBCUTANEOUS

## 2023-10-06 MED ORDER — EPOETIN ALFA-EPBX 40000 UNIT/ML IJ SOLN
INTRAMUSCULAR | Status: AC
  Filled 2023-10-06: qty 1

## 2023-10-20 ENCOUNTER — Encounter (HOSPITAL_COMMUNITY)
Admission: RE | Admit: 2023-10-20 | Discharge: 2023-10-20 | Disposition: A | Discharge status: Home / Self Care | Payer: PPO | Source: Ambulatory Visit | Attending: Nephrology | Admitting: Nephrology

## 2023-10-20 VITALS — BP 122/78 | HR 59 | Temp 97.1°F | Resp 17

## 2023-10-20 DIAGNOSIS — Z79890 Hormone replacement therapy (postmenopausal): Secondary | ICD-10-CM | POA: Insufficient documentation

## 2023-10-20 DIAGNOSIS — N185 Chronic kidney disease, stage 5: Secondary | ICD-10-CM | POA: Diagnosis not present

## 2023-10-20 DIAGNOSIS — N289 Disorder of kidney and ureter, unspecified: Secondary | ICD-10-CM | POA: Diagnosis present

## 2023-10-20 DIAGNOSIS — D631 Anemia in chronic kidney disease: Secondary | ICD-10-CM | POA: Diagnosis not present

## 2023-10-20 LAB — FERRITIN: Ferritin: 689 ng/mL — ABNORMAL HIGH (ref 24–336)

## 2023-10-20 LAB — IRON AND TIBC
Iron: 30 ug/dL — ABNORMAL LOW (ref 45–182)
Saturation Ratios: 14 % — ABNORMAL LOW (ref 17.9–39.5)
TIBC: 220 ug/dL — ABNORMAL LOW (ref 250–450)
UIBC: 190 ug/dL

## 2023-10-20 LAB — POCT HEMOGLOBIN-HEMACUE: Hemoglobin: 8.8 g/dL — ABNORMAL LOW (ref 13.0–17.0)

## 2023-10-20 MED ORDER — EPOETIN ALFA-EPBX 40000 UNIT/ML IJ SOLN
30000.0000 [IU] | INTRAMUSCULAR | Status: DC
  Administered 2023-10-20: 30000 [IU] via SUBCUTANEOUS

## 2023-10-20 MED ORDER — EPOETIN ALFA-EPBX 40000 UNIT/ML IJ SOLN
INTRAMUSCULAR | Status: AC
  Filled 2023-10-20: qty 1

## 2023-11-03 ENCOUNTER — Encounter (HOSPITAL_COMMUNITY)
Admission: RE | Admit: 2023-11-03 | Discharge: 2023-11-03 | Disposition: A | Discharge status: Home / Self Care | Source: Ambulatory Visit | Attending: Nephrology | Admitting: Nephrology

## 2023-11-03 VITALS — BP 130/77 | HR 66 | Temp 97.3°F | Resp 16

## 2023-11-03 DIAGNOSIS — N185 Chronic kidney disease, stage 5: Secondary | ICD-10-CM | POA: Diagnosis not present

## 2023-11-03 DIAGNOSIS — N289 Disorder of kidney and ureter, unspecified: Secondary | ICD-10-CM

## 2023-11-03 LAB — POCT HEMOGLOBIN-HEMACUE: Hemoglobin: 9.1 g/dL — ABNORMAL LOW (ref 13.0–17.0)

## 2023-11-03 MED ORDER — EPOETIN ALFA-EPBX 40000 UNIT/ML IJ SOLN
30000.0000 [IU] | INTRAMUSCULAR | Status: DC
  Administered 2023-11-03: 30000 [IU] via SUBCUTANEOUS

## 2023-11-03 MED ORDER — EPOETIN ALFA-EPBX 40000 UNIT/ML IJ SOLN
INTRAMUSCULAR | Status: AC
  Filled 2023-11-03: qty 1

## 2023-11-17 ENCOUNTER — Encounter (HOSPITAL_COMMUNITY)
Admission: RE | Admit: 2023-11-17 | Discharge: 2023-11-17 | Disposition: A | Discharge status: Home / Self Care | Source: Ambulatory Visit | Attending: Nephrology | Admitting: Nephrology

## 2023-11-17 VITALS — BP 133/79 | HR 63 | Temp 97.4°F | Resp 16

## 2023-11-17 DIAGNOSIS — Z79620 Long term (current) use of immunosuppressive biologic: Secondary | ICD-10-CM | POA: Diagnosis not present

## 2023-11-17 DIAGNOSIS — D631 Anemia in chronic kidney disease: Secondary | ICD-10-CM | POA: Insufficient documentation

## 2023-11-17 DIAGNOSIS — N185 Chronic kidney disease, stage 5: Secondary | ICD-10-CM | POA: Diagnosis not present

## 2023-11-17 DIAGNOSIS — N289 Disorder of kidney and ureter, unspecified: Secondary | ICD-10-CM | POA: Diagnosis present

## 2023-11-17 LAB — IRON AND TIBC
Iron: 33 ug/dL — ABNORMAL LOW (ref 45–182)
Saturation Ratios: 14 % — ABNORMAL LOW (ref 17.9–39.5)
TIBC: 241 ug/dL — ABNORMAL LOW (ref 250–450)
UIBC: 208 ug/dL

## 2023-11-17 LAB — FERRITIN: Ferritin: 676 ng/mL — ABNORMAL HIGH (ref 24–336)

## 2023-11-17 LAB — POCT HEMOGLOBIN-HEMACUE: Hemoglobin: 8.6 g/dL — ABNORMAL LOW (ref 13.0–17.0)

## 2023-11-17 MED ORDER — EPOETIN ALFA-EPBX 40000 UNIT/ML IJ SOLN
30000.0000 [IU] | INTRAMUSCULAR | Status: DC
  Administered 2023-11-17: 30000 [IU] via SUBCUTANEOUS

## 2023-11-17 MED ORDER — EPOETIN ALFA-EPBX 40000 UNIT/ML IJ SOLN
INTRAMUSCULAR | Status: AC
  Filled 2023-11-17: qty 1

## 2023-12-01 ENCOUNTER — Encounter (HOSPITAL_COMMUNITY)
Admission: RE | Admit: 2023-12-01 | Discharge: 2023-12-01 | Disposition: A | Discharge status: Home / Self Care | Source: Ambulatory Visit | Attending: Nephrology | Admitting: Nephrology

## 2023-12-01 VITALS — BP 169/79 | HR 65 | Temp 98.5°F | Resp 16

## 2023-12-01 DIAGNOSIS — N185 Chronic kidney disease, stage 5: Secondary | ICD-10-CM | POA: Diagnosis not present

## 2023-12-01 DIAGNOSIS — N289 Disorder of kidney and ureter, unspecified: Secondary | ICD-10-CM

## 2023-12-01 LAB — POCT HEMOGLOBIN-HEMACUE: Hemoglobin: 8.9 g/dL — ABNORMAL LOW (ref 13.0–17.0)

## 2023-12-01 MED ORDER — EPOETIN ALFA 20000 UNIT/ML IJ SOLN
INTRAMUSCULAR | Status: AC
  Administered 2023-12-01: 30000 [IU] via SUBCUTANEOUS
  Filled 2023-12-01: qty 1

## 2023-12-01 MED ORDER — EPOETIN ALFA 20000 UNIT/ML IJ SOLN: 30000.0000 [IU] | Freq: Once | INTRAMUSCULAR | Status: AC

## 2023-12-01 MED ORDER — EPOETIN ALFA 10000 UNIT/ML IJ SOLN
INTRAMUSCULAR | Status: AC
Start: 2023-12-01 — End: 2023-12-01
  Filled 2023-12-01: qty 1

## 2023-12-02 MED FILL — Epoetin Alfa Inj 20000 Unit/ML: INTRAMUSCULAR | Qty: 1 | Status: AC

## 2023-12-02 MED FILL — Epoetin Alfa Inj 10000 Unit/ML: INTRAMUSCULAR | Qty: 1 | Status: AC

## 2023-12-15 ENCOUNTER — Encounter (HOSPITAL_COMMUNITY)
Admission: RE | Admit: 2023-12-15 | Discharge: 2023-12-15 | Disposition: A | Discharge status: Home / Self Care | Source: Ambulatory Visit | Attending: Nephrology | Admitting: Nephrology

## 2023-12-15 VITALS — BP 146/94 | HR 65 | Temp 97.4°F | Resp 16

## 2023-12-15 DIAGNOSIS — D631 Anemia in chronic kidney disease: Secondary | ICD-10-CM | POA: Insufficient documentation

## 2023-12-15 DIAGNOSIS — N289 Disorder of kidney and ureter, unspecified: Secondary | ICD-10-CM

## 2023-12-15 DIAGNOSIS — N185 Chronic kidney disease, stage 5: Secondary | ICD-10-CM | POA: Insufficient documentation

## 2023-12-15 LAB — IRON AND TIBC
Iron: 40 ug/dL — ABNORMAL LOW (ref 45–182)
Saturation Ratios: 15 % — ABNORMAL LOW (ref 17.9–39.5)
TIBC: 267 ug/dL (ref 250–450)
UIBC: 227 ug/dL

## 2023-12-15 LAB — FERRITIN: Ferritin: 651 ng/mL — ABNORMAL HIGH (ref 24–336)

## 2023-12-15 LAB — POCT HEMOGLOBIN-HEMACUE: Hemoglobin: 8.5 g/dL — ABNORMAL LOW (ref 13.0–17.0)

## 2023-12-15 MED ORDER — EPOETIN ALFA-EPBX 40000 UNIT/ML IJ SOLN
INTRAMUSCULAR | Status: AC
  Administered 2023-12-15: 30000 [IU]
  Filled 2023-12-15: qty 1

## 2023-12-15 MED ORDER — EPOETIN ALFA 20000 UNIT/ML IJ SOLN
30000.0000 [IU] | Freq: Once | INTRAMUSCULAR | Status: DC
Start: 2023-12-15 — End: 2023-12-16

## 2023-12-29 ENCOUNTER — Encounter (HOSPITAL_COMMUNITY)

## 2023-12-30 ENCOUNTER — Encounter (HOSPITAL_COMMUNITY)
Admission: RE | Admit: 2023-12-30 | Discharge: 2023-12-30 | Disposition: A | Discharge status: Home / Self Care | Source: Ambulatory Visit | Attending: Nephrology | Admitting: Nephrology

## 2023-12-30 VITALS — BP 141/87 | HR 71 | Temp 97.3°F | Resp 16

## 2023-12-30 DIAGNOSIS — N185 Chronic kidney disease, stage 5: Secondary | ICD-10-CM | POA: Diagnosis not present

## 2023-12-30 DIAGNOSIS — N289 Disorder of kidney and ureter, unspecified: Secondary | ICD-10-CM

## 2023-12-30 LAB — POCT HEMOGLOBIN-HEMACUE: Hemoglobin: 8.6 g/dL — ABNORMAL LOW (ref 13.0–17.0)

## 2023-12-30 MED ORDER — EPOETIN ALFA-EPBX 10000 UNIT/ML IJ SOLN: 10000.0000 [IU] | Freq: Once | INTRAMUSCULAR | Status: DC

## 2023-12-30 MED ORDER — EPOETIN ALFA-EPBX 40000 UNIT/ML IJ SOLN
30000.0000 [IU] | Freq: Once | INTRAMUSCULAR | Status: AC
  Administered 2023-12-30: 30000 [IU] via SUBCUTANEOUS

## 2023-12-30 MED ORDER — EPOETIN ALFA-EPBX 40000 UNIT/ML IJ SOLN
INTRAMUSCULAR | Status: AC
  Filled 2023-12-30: qty 1

## 2024-01-12 ENCOUNTER — Encounter (HOSPITAL_COMMUNITY)
Admission: RE | Admit: 2024-01-12 | Discharge: 2024-01-12 | Disposition: A | Discharge status: Home / Self Care | Source: Ambulatory Visit | Attending: Nephrology | Admitting: Nephrology

## 2024-01-12 VITALS — BP 120/76 | HR 67 | Temp 97.5°F | Resp 18

## 2024-01-12 DIAGNOSIS — D631 Anemia in chronic kidney disease: Secondary | ICD-10-CM | POA: Diagnosis not present

## 2024-01-12 DIAGNOSIS — N289 Disorder of kidney and ureter, unspecified: Secondary | ICD-10-CM | POA: Insufficient documentation

## 2024-01-12 DIAGNOSIS — N189 Chronic kidney disease, unspecified: Secondary | ICD-10-CM | POA: Diagnosis not present

## 2024-01-12 LAB — IRON AND TIBC
Iron: 24 ug/dL — ABNORMAL LOW (ref 45–182)
Saturation Ratios: 10 % — ABNORMAL LOW (ref 17.9–39.5)
TIBC: 238 ug/dL — ABNORMAL LOW (ref 250–450)
UIBC: 214 ug/dL

## 2024-01-12 LAB — POCT HEMOGLOBIN-HEMACUE: Hemoglobin: 7.6 g/dL — ABNORMAL LOW (ref 13.0–17.0)

## 2024-01-12 LAB — FERRITIN: Ferritin: 636 ng/mL — ABNORMAL HIGH (ref 24–336)

## 2024-01-12 MED ORDER — EPOETIN ALFA-EPBX 40000 UNIT/ML IJ SOLN
30000.0000 [IU] | INTRAMUSCULAR | Status: DC
  Administered 2024-01-12: 30000 [IU] via SUBCUTANEOUS

## 2024-01-12 MED ORDER — EPOETIN ALFA-EPBX 40000 UNIT/ML IJ SOLN
INTRAMUSCULAR | Status: AC
  Filled 2024-01-12: qty 1

## 2024-01-12 NOTE — Progress Notes (Signed)
 Hemocue today 7.6, 8.6 two weeks ago. Pt denies seeing any bleeing in his urine or stool, no shortness of breath, no chest pain.  Reported the above to [place] at [facility]. No new orders received.

## 2024-01-26 ENCOUNTER — Encounter (HOSPITAL_COMMUNITY)
Admission: RE | Admit: 2024-01-26 | Discharge: 2024-01-26 | Disposition: A | Discharge status: Home / Self Care | Source: Ambulatory Visit | Attending: Nephrology | Admitting: Nephrology

## 2024-01-26 VITALS — BP 115/71 | HR 60 | Temp 97.8°F | Resp 18

## 2024-01-26 DIAGNOSIS — N189 Chronic kidney disease, unspecified: Secondary | ICD-10-CM | POA: Diagnosis not present

## 2024-01-26 DIAGNOSIS — N289 Disorder of kidney and ureter, unspecified: Secondary | ICD-10-CM

## 2024-01-26 LAB — POCT HEMOGLOBIN-HEMACUE: Hemoglobin: 7.4 g/dL — ABNORMAL LOW (ref 13.0–17.0)

## 2024-01-26 MED ORDER — EPOETIN ALFA-EPBX 40000 UNIT/ML IJ SOLN
INTRAMUSCULAR | Status: AC
  Filled 2024-01-26: qty 1

## 2024-01-26 MED ORDER — SODIUM CHLORIDE 0.9 % IV SOLN
510.0000 mg | Freq: Once | INTRAVENOUS | Status: AC
  Administered 2024-01-26: 510 mg via INTRAVENOUS
  Filled 2024-01-26: qty 510

## 2024-01-26 MED ORDER — EPOETIN ALFA-EPBX 40000 UNIT/ML IJ SOLN
30000.0000 [IU] | INTRAMUSCULAR | Status: DC
  Administered 2024-01-26: 30000 [IU] via SUBCUTANEOUS
  Filled 2024-01-26: qty 1

## 2024-02-09 ENCOUNTER — Encounter (HOSPITAL_COMMUNITY)
Admission: RE | Admit: 2024-02-09 | Discharge: 2024-02-09 | Disposition: A | Discharge status: Home / Self Care | Source: Ambulatory Visit | Attending: Nephrology | Admitting: Nephrology

## 2024-02-09 VITALS — BP 121/73 | HR 66 | Temp 98.3°F | Resp 17

## 2024-02-09 DIAGNOSIS — N185 Chronic kidney disease, stage 5: Secondary | ICD-10-CM | POA: Diagnosis not present

## 2024-02-09 DIAGNOSIS — D631 Anemia in chronic kidney disease: Secondary | ICD-10-CM | POA: Diagnosis present

## 2024-02-09 DIAGNOSIS — N289 Disorder of kidney and ureter, unspecified: Secondary | ICD-10-CM

## 2024-02-09 LAB — POCT HEMOGLOBIN-HEMACUE: Hemoglobin: 8.2 g/dL — ABNORMAL LOW (ref 13.0–17.0)

## 2024-02-09 MED ORDER — EPOETIN ALFA-EPBX 40000 UNIT/ML IJ SOLN
30000.0000 [IU] | INTRAMUSCULAR | Status: DC
  Administered 2024-02-09: 30000 [IU] via SUBCUTANEOUS

## 2024-02-09 MED ORDER — SODIUM CHLORIDE 0.9 % IV SOLN
510.0000 mg | Freq: Once | INTRAVENOUS | Status: AC
  Administered 2024-02-09: 510 mg via INTRAVENOUS
  Filled 2024-02-09: qty 510

## 2024-02-09 MED ORDER — EPOETIN ALFA-EPBX 40000 UNIT/ML IJ SOLN
INTRAMUSCULAR | Status: AC
Start: 2024-02-09 — End: 2024-02-09
  Filled 2024-02-09: qty 1

## 2024-02-23 ENCOUNTER — Encounter (HOSPITAL_COMMUNITY)
Admission: RE | Admit: 2024-02-23 | Discharge: 2024-02-23 | Disposition: A | Discharge status: Home / Self Care | Source: Ambulatory Visit | Attending: Nephrology | Admitting: Nephrology

## 2024-02-23 VITALS — BP 120/69 | HR 66 | Temp 97.3°F | Resp 16

## 2024-02-23 DIAGNOSIS — N289 Disorder of kidney and ureter, unspecified: Secondary | ICD-10-CM

## 2024-02-23 DIAGNOSIS — N185 Chronic kidney disease, stage 5: Secondary | ICD-10-CM | POA: Diagnosis not present

## 2024-02-23 LAB — POCT HEMOGLOBIN-HEMACUE: Hemoglobin: 8.3 g/dL — ABNORMAL LOW (ref 13.0–17.0)

## 2024-02-23 LAB — IRON AND TIBC
Iron: 62 ug/dL (ref 45–182)
Saturation Ratios: 27 % (ref 17.9–39.5)
TIBC: 230 ug/dL — ABNORMAL LOW (ref 250–450)
UIBC: 168 ug/dL

## 2024-02-23 LAB — FERRITIN: Ferritin: 637 ng/mL — ABNORMAL HIGH (ref 24–336)

## 2024-02-23 MED ORDER — EPOETIN ALFA-EPBX 40000 UNIT/ML IJ SOLN
30000.0000 [IU] | INTRAMUSCULAR | Status: DC
  Administered 2024-02-23: 30000 [IU] via SUBCUTANEOUS

## 2024-02-23 MED ORDER — EPOETIN ALFA-EPBX 40000 UNIT/ML IJ SOLN
INTRAMUSCULAR | Status: AC
  Filled 2024-02-23: qty 1

## 2024-03-08 ENCOUNTER — Encounter (HOSPITAL_COMMUNITY)
Admission: RE | Admit: 2024-03-08 | Discharge: 2024-03-08 | Disposition: A | Discharge status: Home / Self Care | Source: Ambulatory Visit | Attending: Nephrology | Admitting: Nephrology

## 2024-03-08 VITALS — BP 133/84 | HR 65 | Temp 97.6°F | Resp 16

## 2024-03-08 DIAGNOSIS — N185 Chronic kidney disease, stage 5: Secondary | ICD-10-CM | POA: Diagnosis not present

## 2024-03-08 DIAGNOSIS — N289 Disorder of kidney and ureter, unspecified: Secondary | ICD-10-CM

## 2024-03-08 LAB — POCT HEMOGLOBIN-HEMACUE: Hemoglobin: 9 g/dL — ABNORMAL LOW (ref 13.0–17.0)

## 2024-03-08 MED ORDER — EPOETIN ALFA-EPBX 40000 UNIT/ML IJ SOLN
40000.0000 [IU] | INTRAMUSCULAR | Status: DC
  Administered 2024-03-08: 40000 [IU] via SUBCUTANEOUS
  Filled 2024-03-08: qty 1

## 2024-03-08 MED ORDER — EPOETIN ALFA-EPBX 40000 UNIT/ML IJ SOLN
INTRAMUSCULAR | Status: AC
  Filled 2024-03-08: qty 1

## 2024-03-21 ENCOUNTER — Encounter (HOSPITAL_COMMUNITY): Payer: Self-pay

## 2024-03-21 ENCOUNTER — Ambulatory Visit (HOSPITAL_COMMUNITY)
Admission: EM | Admit: 2024-03-21 | Discharge: 2024-03-21 | Disposition: A | Discharge status: Home / Self Care | Attending: Internal Medicine | Admitting: Internal Medicine

## 2024-03-21 DIAGNOSIS — I10 Essential (primary) hypertension: Secondary | ICD-10-CM | POA: Diagnosis not present

## 2024-03-21 DIAGNOSIS — R051 Acute cough: Secondary | ICD-10-CM

## 2024-03-21 DIAGNOSIS — J0100 Acute maxillary sinusitis, unspecified: Secondary | ICD-10-CM

## 2024-03-21 DIAGNOSIS — R0981 Nasal congestion: Secondary | ICD-10-CM | POA: Diagnosis not present

## 2024-03-21 LAB — POC SARS CORONAVIRUS 2 AG -  ED: SARS Coronavirus 2 Ag: NEGATIVE

## 2024-03-21 MED ORDER — CEFDINIR 300 MG PO CAPS: 300.0000 mg | ORAL_CAPSULE | Freq: Every day | ORAL | 0 refills | Status: AC

## 2024-03-21 NOTE — Discharge Instructions (Addendum)
 Your COVID test was negative. A prescription was sent for Cefdinir . This is an antibiotic used to treat upper respiratory symptoms. Take as directed.   It is very important for you to pay attention to any new symptoms or worsening of your current condition.  Please go directly to the Emergency Department immediately should you begin to have any of the following symptoms: shortness of breath, chest pain or difficulty breathing.

## 2024-03-21 NOTE — ED Triage Notes (Signed)
 Pt c/o productive cough with green sputum since Friday with congestion. States since yesterday when he blows his nose it has blood in it. Took OTC meds with no relief.

## 2024-03-21 NOTE — ED Provider Notes (Signed)
 [facility]   Note:  This document was prepared using Dragon voice recognition software and may include unintentional dictation errors.  MRN: [id number] DOB: [birth year] DATE: 03/21/24   Subjective:  Chief Complaint:  Chief Complaint  Patient presents with   Cough     HPI: Marvin Garcia is a 68 y.o. male presenting for productive cough and nasal congestion for the past 4 days. Reports that he started with some congestion on Friday, but has since developed a productive cough. He reports increased congestion as well with a bloody discharge. He has been taking OTC corcidine for HBP with no relief.  Patient report his son has been sick as well.  Denies fever, nausea/vomiting, sore throat, otalgia, shortness of breath, difficulty. Endorses cough, nasal congestion. Presents NAD.  Prior to Admission medications   Medication Sig Start Date End Date Taking? Authorizing Provider  cefdinir  (OMNICEF ) 300 MG capsule Take 1 capsule (300 mg total) by mouth daily for 7 days. 03/21/24 03/28/24 Yes Kelan Pritt P, PA-C  acetaminophen  (TYLENOL ) 650 MG CR tablet Take 650-1,300 mg by mouth every 8 (eight) hours as needed for pain.    [provider]  amLODipine  (NORVASC ) 10 MG tablet Take 10 mg by mouth at bedtime.  10/03/19   [provider]  Ascorbic Acid  (VITAMIN C ) 1000 MG tablet Take 1,000 mg by mouth daily.    [provider]  aspirin  81 MG chewable tablet Chew 1 tablet (81 mg total) by mouth 2 (two) times daily. Patient taking differently: Chew 81 mg by mouth daily. 03/20/17   Swinteck, Redell, MD  atorvastatin  (LIPITOR) 20 MG tablet Take 20 mg by mouth every evening.     [provider]  calcitRIOL  (ROCALTROL ) 0.25 MCG capsule Take 0.25 mcg by mouth every other day.    [provider]  carvedilol  (COREG ) 25 MG tablet Take 25 mg by mouth 2 (two) times daily with a meal.    [provider]  cloNIDine  (CATAPRES ) 0.3 MG tablet Take 0.3 mg by  mouth in the morning, at noon, and at bedtime.    [provider]  Coenzyme Q10 (COQ-10) 100 MG CAPS Take 100 mg by mouth daily.    [provider]  colchicine 0.6 MG tablet Take 0.6 mg by mouth daily as needed (for gout flare up).    [provider]  Febuxostat  80 MG TABS Take 80 mg by mouth daily.     [provider]  ferrous sulfate  325 (65 FE) MG tablet Take 325 mg by mouth daily.    [provider]  furosemide  (LASIX ) 40 MG tablet Take 40-80 mg by mouth See admin instructions. Take 80 mg by mouth every morning and take 40 mg by mouth every evening    [provider]  hydrALAZINE  (APRESOLINE ) 100 MG tablet Take 100 mg by mouth 3 (three) times daily.    [provider]  irbesartan  (AVAPRO ) 150 MG tablet Take 150 mg by mouth daily.    [provider]  isosorbide  mononitrate (IMDUR ) 30 MG 24 hr tablet Take 30 mg by mouth daily. 09/08/19   [provider]  methocarbamol  (ROBAXIN ) 500 MG tablet Take 1 tablet (500 mg total) by mouth 4 (four) times daily. 10/14/19   Marvin Olympia SAUNDERS, PA  Multiple Vitamin (MULTIVITAMIN WITH MINERALS) TABS tablet Take 1 tablet by mouth daily.    [provider]  Omega-3 Fatty Acids (OMEGA 3 500) 500 MG CAPS Take 500 mg by mouth daily.  [provider]     Allergies  Allergen Reactions   Hydrocodone Other (See Comments)    Severe hiccups    History:   Past Medical History:  Diagnosis Date   Arrhythmia 1991   patient  describes that he woke up surrounded by doctors saying that his heart had stopped while sleeping; denies having pain during this event; also denies any reccurrent issues after that episode; says that was when they told me i had high blood pressure    Arthritis    Chronic kidney disease    stage IV managed by [facility]    Headache    Hypertension    Umbilical hernia      Past Surgical History:  Procedure Laterality Date    HERNIA REPAIR     inguinal    INSERTION OF MESH N/A 01/21/2017   Procedure: INSERTION OF MESH;  Surgeon: Signe Mitzie LABOR, MD;  Location: [facility];  Service: General;  Laterality: N/A;   JOINT REPLACEMENT     KNEE ARTHROSCOPY     TOTAL HIP ARTHROPLASTY Left 03/19/2017   Procedure: LEFT TOTAL HIP ARTHROPLASTY ANTERIOR APPROACH;  Surgeon: Fidel Rogue, MD;  Location: [facility];  Service: Orthopedics;  Laterality: Left;  Needs RNFA   TOTAL KNEE ARTHROPLASTY  06/04/2012   Procedure: TOTAL KNEE ARTHROPLASTY;  Surgeon: Lamar Collet, MD;  Location: [facility];  Service: Orthopedics;  Laterality: Left;   TOTAL KNEE ARTHROPLASTY Right 10/14/2019   Procedure: TOTAL KNEE ARTHROPLASTY;  Surgeon: Collet Lamar, MD;  Location: [facility];  Service: Orthopedics;  Laterality: Right;  adductor canal   UMBILICAL HERNIA REPAIR N/A 01/21/2017   Procedure: UMBILICAL HERNIA REPAIR WITH MESH;  Surgeon: Signe Mitzie LABOR, MD;  Location: [facility];  Service: General;  Laterality: N/A;    Family History  Problem Relation Age of Onset   Diabetes Mother    Heart disease Father    Asthma Son    Diabetes Maternal Grandmother     Social History   Tobacco Use   Smoking status: Never   Smokeless tobacco: Never  Vaping Use   Vaping status: Never Used  Substance Use Topics   Alcohol use: No   Drug use: No    Review of Systems  Constitutional:  Negative for fever.  HENT:  Positive for congestion, postnasal drip, rhinorrhea and sinus pressure. Negative for ear pain and sore throat.   Respiratory:  Positive for cough. Negative for shortness of breath.   Gastrointestinal:  Negative for abdominal pain, nausea and vomiting.     Objective:   Vitals: BP (!) 178/95 (BP Location: Left Arm)   Pulse 68   Temp (!) 97.5 F (36.4 C) (Oral)   Resp 18   SpO2 93%   Physical Exam Constitutional:      General: He is not in acute distress.    Appearance: Normal appearance. He is well-developed and overweight. He is not  ill-appearing or toxic-appearing.  HENT:     Head: Normocephalic and atraumatic.     Right Ear: Ear canal normal. A middle ear effusion is present.     Left Ear: Ear canal normal. A middle ear effusion is present.     Nose: Rhinorrhea present. Rhinorrhea is clear.     Right Turbinates: Swollen.     Left Turbinates: Swollen.     Right Sinus: Maxillary sinus tenderness present.     Left Sinus: Maxillary sinus tenderness present.     Mouth/Throat:     Pharynx:  Oropharynx is clear. Uvula midline. No pharyngeal swelling, oropharyngeal exudate or posterior oropharyngeal erythema.     Tonsils: No tonsillar exudate or tonsillar abscesses.  Cardiovascular:     Rate and Rhythm: Normal rate and regular rhythm.     Heart sounds: Normal heart sounds.  Pulmonary:     Effort: Pulmonary effort is normal.     Breath sounds: Normal breath sounds.     Comments: Clear to auscultation bilaterally  Abdominal:     General: Bowel sounds are normal.     Palpations: Abdomen is soft.     Tenderness: There is no abdominal tenderness.  Skin:    General: Skin is warm and dry.  Neurological:     General: No focal deficit present.     Mental Status: He is alert.  Psychiatric:        Mood and Affect: Mood and affect normal.     Results:  Labs: Results for orders placed or performed during the hospital encounter of 03/21/24 (from the past 24 hours)  POC SARS Coronavirus 2 Ag-ED - Nasal Swab     Status: None   Collection Time: 03/21/24  5:12 PM  Result Value Ref Range   SARS Coronavirus 2 Ag Negative Negative    Radiology: No results found.   UC Course/Treatments:  Procedures: Procedures   Medications Ordered in UC: Medications - No data to display   Assessment and Plan :     ICD-10-CM   1. Acute non-recurrent maxillary sinusitis  J01.00     2. Acute cough  R05.1 POC SARS Coronavirus 2 Ag-ED - Nasal Swab    POC SARS Coronavirus 2 Ag-ED - Nasal Swab    3. Nasal congestion  R09.81     4.  Essential hypertension  I10       Acute non-recurrent maxillary sinusitis Nasal congestion Acute cough Afebrile, nontoxic-appearing, NAD. VSS. DDX includes but not limited to: COVID, flu, viral URI, sinusitis, allergic rhinitis COVID was negative today in office.  Given worsening symptoms and no improvement with over-the-counter treatments and patient's comorbidities, cefdinir  30 mg daily was prescribed for sinusitis.  Of note, patient states he has a history of chronic kidney disease.  Per EMR, appears to be stage V, but patient is not currently on dialysis.  Given creatinine clearance, Cefdinir  300mg  daily was prescribed based on CMP from 08/2023.  Patient declined repeat CMP today since he is seeing [facility] tomorrow for blood work. Strict ED precautions were given and patient verbalized understanding.  Essential hypertension Afebrile, nontoxic-appearing, NAD. VSS. Previously diagnosed.  Currently asymptomatic.  Patient reports that he has not taken his blood pressure medications that are due tonight. Patient advised to take as directed and follow up with PCP as directed. Strict ED precautions were given and patient verbalized understanding.  ED Discharge Orders          Ordered    cefdinir  (OMNICEF ) 300 MG capsule  Daily        03/21/24 1726             PDMP not reviewed this encounter.     Basilia Ulanda SQUIBB, PA-C 03/21/24 1804

## 2024-03-22 ENCOUNTER — Encounter (HOSPITAL_COMMUNITY)
Admission: RE | Admit: 2024-03-22 | Discharge: 2024-03-22 | Disposition: A | Discharge status: Home / Self Care | Source: Ambulatory Visit | Attending: Nephrology | Admitting: Nephrology

## 2024-03-22 VITALS — BP 147/91 | HR 63 | Temp 97.2°F | Resp 17

## 2024-03-22 DIAGNOSIS — N185 Chronic kidney disease, stage 5: Secondary | ICD-10-CM | POA: Diagnosis present

## 2024-03-22 DIAGNOSIS — D631 Anemia in chronic kidney disease: Secondary | ICD-10-CM | POA: Insufficient documentation

## 2024-03-22 DIAGNOSIS — N289 Disorder of kidney and ureter, unspecified: Secondary | ICD-10-CM

## 2024-03-22 LAB — FERRITIN: Ferritin: 789 ng/mL — ABNORMAL HIGH (ref 24–336)

## 2024-03-22 LAB — IRON AND TIBC
Iron: 33 ug/dL — ABNORMAL LOW (ref 45–182)
Saturation Ratios: 12 % — ABNORMAL LOW (ref 17.9–39.5)
TIBC: 269 ug/dL (ref 250–450)
UIBC: 236 ug/dL

## 2024-03-22 LAB — POCT HEMOGLOBIN-HEMACUE: Hemoglobin: 9.1 g/dL — ABNORMAL LOW (ref 13.0–17.0)

## 2024-03-22 MED ORDER — EPOETIN ALFA-EPBX 40000 UNIT/ML IJ SOLN
40000.0000 [IU] | INTRAMUSCULAR | Status: DC
  Administered 2024-03-22 (×2): 40000 [IU] via SUBCUTANEOUS

## 2024-03-22 MED ORDER — EPOETIN ALFA-EPBX 40000 UNIT/ML IJ SOLN
INTRAMUSCULAR | Status: AC
  Filled 2024-03-22: qty 1

## 2024-04-05 ENCOUNTER — Encounter (HOSPITAL_COMMUNITY)
Admission: RE | Admit: 2024-04-05 | Discharge: 2024-04-05 | Disposition: A | Discharge status: Home / Self Care | Source: Ambulatory Visit | Attending: Nephrology | Admitting: Nephrology

## 2024-04-05 VITALS — BP 140/79 | HR 108 | Temp 97.3°F | Resp 16

## 2024-04-05 DIAGNOSIS — N185 Chronic kidney disease, stage 5: Secondary | ICD-10-CM | POA: Diagnosis not present

## 2024-04-05 DIAGNOSIS — N289 Disorder of kidney and ureter, unspecified: Secondary | ICD-10-CM

## 2024-04-05 LAB — POCT HEMOGLOBIN-HEMACUE: Hemoglobin: 9.4 g/dL — ABNORMAL LOW (ref 13.0–17.0)

## 2024-04-05 MED ORDER — EPOETIN ALFA-EPBX 40000 UNIT/ML IJ SOLN
40000.0000 [IU] | INTRAMUSCULAR | Status: DC
  Administered 2024-04-05: 40000 [IU] via SUBCUTANEOUS

## 2024-04-05 MED ORDER — EPOETIN ALFA-EPBX 40000 UNIT/ML IJ SOLN
INTRAMUSCULAR | Status: AC
  Filled 2024-04-05: qty 1

## 2024-04-15 ENCOUNTER — Telehealth: Payer: Self-pay | Admitting: Pharmacy Technician

## 2024-04-15 ENCOUNTER — Other Ambulatory Visit (HOSPITAL_COMMUNITY): Payer: Self-pay | Admitting: Nephrology

## 2024-04-15 DIAGNOSIS — D631 Anemia in chronic kidney disease: Secondary | ICD-10-CM | POA: Insufficient documentation

## 2024-04-15 NOTE — Telephone Encounter (Signed)
 Auth Submission: NO AUTH NEEDED Site of care: Site of care: [facility] Payer: [employer] ADVT Medication & CPT/J Code(s) submitted: RETACRIT  Q5106 Diagnosis Code: N28.9 Route of submission (phone, fax, portal):  Phone # Fax # Auth type: Buy/Bill HB Units/visits requested: 30,000 U Q14 DAYS Reference number:  Approval from: 01/22/24 to 08/10/24

## 2024-04-19 ENCOUNTER — Inpatient Hospital Stay (HOSPITAL_COMMUNITY): Admission: RE | Admit: 2024-04-19 | Source: Ambulatory Visit

## 2024-04-19 ENCOUNTER — Encounter (HOSPITAL_COMMUNITY)
Admission: RE | Admit: 2024-04-19 | Discharge: 2024-04-19 | Disposition: A | Discharge status: Home / Self Care | Source: Ambulatory Visit | Attending: Nephrology | Admitting: Nephrology

## 2024-04-19 VITALS — BP 138/83 | HR 62 | Temp 97.2°F | Resp 16

## 2024-04-19 DIAGNOSIS — D631 Anemia in chronic kidney disease: Secondary | ICD-10-CM | POA: Diagnosis present

## 2024-04-19 DIAGNOSIS — N185 Chronic kidney disease, stage 5: Secondary | ICD-10-CM | POA: Diagnosis present

## 2024-04-19 LAB — IRON AND TIBC
Iron: 33 ug/dL — ABNORMAL LOW (ref 45–182)
Saturation Ratios: 13 % — ABNORMAL LOW (ref 17.9–39.5)
TIBC: 252 ug/dL (ref 250–450)
UIBC: 219 ug/dL

## 2024-04-19 LAB — FERRITIN: Ferritin: 655 ng/mL — ABNORMAL HIGH (ref 24–336)

## 2024-04-19 MED ORDER — EPOETIN ALFA-EPBX 40000 UNIT/ML IJ SOLN
INTRAMUSCULAR | Status: AC
Start: 2024-04-19 — End: 2024-04-19
  Filled 2024-04-19: qty 1

## 2024-04-19 MED ORDER — EPOETIN ALFA-EPBX 40000 UNIT/ML IJ SOLN
40000.0000 [IU] | Freq: Once | INTRAMUSCULAR | Status: AC
  Administered 2024-04-19: 40000 [IU] via SUBCUTANEOUS

## 2024-04-19 MED ORDER — SODIUM CHLORIDE 0.9 % IV SOLN: Freq: Once | INTRAVENOUS | Status: DC | PRN

## 2024-04-19 MED ORDER — ALBUTEROL SULFATE HFA 108 (90 BASE) MCG/ACT IN AERS: 2.0000 | INHALATION_SPRAY | Freq: Once | RESPIRATORY_TRACT | Status: DC | PRN

## 2024-04-19 MED ORDER — FAMOTIDINE IN NACL 20-0.9 MG/50ML-% IV SOLN: 20.0000 mg | Freq: Once | INTRAVENOUS | Status: DC | PRN

## 2024-04-19 MED ORDER — EPINEPHRINE 0.3 MG/0.3ML IJ SOAJ: 0.3000 mg | Freq: Once | INTRAMUSCULAR | Status: DC | PRN

## 2024-04-19 MED ORDER — CLONIDINE HCL 0.1 MG PO TABS: 0.1000 mg | ORAL_TABLET | ORAL | Status: DC | PRN

## 2024-04-19 MED ORDER — DIPHENHYDRAMINE HCL 50 MG/ML IJ SOLN: 50.0000 mg | Freq: Once | INTRAMUSCULAR | Status: DC | PRN

## 2024-04-19 MED ORDER — METHYLPREDNISOLONE SODIUM SUCC 125 MG IJ SOLR: 125.0000 mg | Freq: Once | INTRAMUSCULAR | Status: DC | PRN

## 2024-04-20 LAB — POCT HEMOGLOBIN-HEMACUE: Hemoglobin: 8.8 g/dL — ABNORMAL LOW (ref 13.0–17.0)

## 2024-05-03 ENCOUNTER — Encounter (HOSPITAL_COMMUNITY)

## 2024-05-03 ENCOUNTER — Encounter (HOSPITAL_COMMUNITY)
Admission: RE | Admit: 2024-05-03 | Discharge: 2024-05-03 | Disposition: A | Discharge status: Home / Self Care | Source: Ambulatory Visit | Attending: Nephrology | Admitting: Nephrology

## 2024-05-03 VITALS — BP 151/83 | HR 67 | Temp 97.5°F | Resp 16

## 2024-05-03 DIAGNOSIS — N179 Acute kidney failure, unspecified: Secondary | ICD-10-CM | POA: Diagnosis not present

## 2024-05-03 DIAGNOSIS — N185 Chronic kidney disease, stage 5: Secondary | ICD-10-CM

## 2024-05-03 DIAGNOSIS — I120 Hypertensive chronic kidney disease with stage 5 chronic kidney disease or end stage renal disease: Secondary | ICD-10-CM | POA: Diagnosis not present

## 2024-05-03 LAB — POCT HEMOGLOBIN-HEMACUE: Hemoglobin: 9 g/dL — ABNORMAL LOW (ref 13.0–17.0)

## 2024-05-03 MED ORDER — EPOETIN ALFA-EPBX 40000 UNIT/ML IJ SOLN
40000.0000 [IU] | Freq: Once | INTRAMUSCULAR | Status: AC
  Administered 2024-05-03: 40000 [IU] via SUBCUTANEOUS

## 2024-05-03 MED ORDER — EPOETIN ALFA-EPBX 40000 UNIT/ML IJ SOLN
INTRAMUSCULAR | Status: AC
  Filled 2024-05-03: qty 1

## 2024-05-04 ENCOUNTER — Other Ambulatory Visit: Payer: Self-pay

## 2024-05-04 ENCOUNTER — Emergency Department (HOSPITAL_BASED_OUTPATIENT_CLINIC_OR_DEPARTMENT_OTHER)

## 2024-05-04 ENCOUNTER — Inpatient Hospital Stay (HOSPITAL_BASED_OUTPATIENT_CLINIC_OR_DEPARTMENT_OTHER)
Admission: EM | Admit: 2024-05-04 | Discharge: 2024-05-11 | DRG: 674 | Disposition: A | Discharge status: Home / Self Care | Attending: Internal Medicine | Admitting: Internal Medicine

## 2024-05-04 DIAGNOSIS — E872 Acidosis, unspecified: Secondary | ICD-10-CM | POA: Diagnosis present

## 2024-05-04 DIAGNOSIS — Z833 Family history of diabetes mellitus: Secondary | ICD-10-CM

## 2024-05-04 DIAGNOSIS — N2889 Other specified disorders of kidney and ureter: Principal | ICD-10-CM

## 2024-05-04 DIAGNOSIS — Z905 Acquired absence of kidney: Secondary | ICD-10-CM

## 2024-05-04 DIAGNOSIS — I77810 Thoracic aortic ectasia: Secondary | ICD-10-CM | POA: Diagnosis present

## 2024-05-04 DIAGNOSIS — I120 Hypertensive chronic kidney disease with stage 5 chronic kidney disease or end stage renal disease: Principal | ICD-10-CM | POA: Diagnosis present

## 2024-05-04 DIAGNOSIS — R066 Hiccough: Secondary | ICD-10-CM | POA: Diagnosis present

## 2024-05-04 DIAGNOSIS — Z96651 Presence of right artificial knee joint: Secondary | ICD-10-CM | POA: Diagnosis present

## 2024-05-04 DIAGNOSIS — Z96653 Presence of artificial knee joint, bilateral: Secondary | ICD-10-CM | POA: Diagnosis present

## 2024-05-04 DIAGNOSIS — N186 End stage renal disease: Secondary | ICD-10-CM | POA: Diagnosis present

## 2024-05-04 DIAGNOSIS — I2510 Atherosclerotic heart disease of native coronary artery without angina pectoris: Secondary | ICD-10-CM | POA: Diagnosis present

## 2024-05-04 DIAGNOSIS — Z825 Family history of asthma and other chronic lower respiratory diseases: Secondary | ICD-10-CM

## 2024-05-04 DIAGNOSIS — Z8249 Family history of ischemic heart disease and other diseases of the circulatory system: Secondary | ICD-10-CM

## 2024-05-04 DIAGNOSIS — Z79899 Other long term (current) drug therapy: Secondary | ICD-10-CM

## 2024-05-04 DIAGNOSIS — Z96642 Presence of left artificial hip joint: Secondary | ICD-10-CM | POA: Diagnosis present

## 2024-05-04 DIAGNOSIS — D631 Anemia in chronic kidney disease: Secondary | ICD-10-CM | POA: Diagnosis present

## 2024-05-04 DIAGNOSIS — Z885 Allergy status to narcotic agent status: Secondary | ICD-10-CM

## 2024-05-04 DIAGNOSIS — C641 Malignant neoplasm of right kidney, except renal pelvis: Secondary | ICD-10-CM | POA: Diagnosis present

## 2024-05-04 DIAGNOSIS — R918 Other nonspecific abnormal finding of lung field: Secondary | ICD-10-CM | POA: Diagnosis present

## 2024-05-04 DIAGNOSIS — I472 Ventricular tachycardia, unspecified: Secondary | ICD-10-CM | POA: Diagnosis not present

## 2024-05-04 DIAGNOSIS — I493 Ventricular premature depolarization: Secondary | ICD-10-CM | POA: Diagnosis present

## 2024-05-04 DIAGNOSIS — Z992 Dependence on renal dialysis: Secondary | ICD-10-CM

## 2024-05-04 DIAGNOSIS — N179 Acute kidney failure, unspecified: Secondary | ICD-10-CM | POA: Diagnosis present

## 2024-05-04 DIAGNOSIS — E871 Hypo-osmolality and hyponatremia: Secondary | ICD-10-CM | POA: Diagnosis not present

## 2024-05-04 DIAGNOSIS — N185 Chronic kidney disease, stage 5: Secondary | ICD-10-CM

## 2024-05-04 DIAGNOSIS — R7989 Other specified abnormal findings of blood chemistry: Secondary | ICD-10-CM | POA: Diagnosis present

## 2024-05-04 DIAGNOSIS — I480 Paroxysmal atrial fibrillation: Secondary | ICD-10-CM | POA: Diagnosis present

## 2024-05-04 LAB — CBC WITH DIFFERENTIAL/PLATELET
Abs Immature Granulocytes: 0.05 K/uL (ref 0.00–0.07)
Basophils Absolute: 0 K/uL (ref 0.0–0.1)
Basophils Relative: 0 %
Eosinophils Absolute: 0.1 K/uL (ref 0.0–0.5)
Eosinophils Relative: 2 %
HCT: 25.2 % — ABNORMAL LOW (ref 39.0–52.0)
Hemoglobin: 8.1 g/dL — ABNORMAL LOW (ref 13.0–17.0)
Immature Granulocytes: 1 %
Lymphocytes Relative: 14 %
Lymphs Abs: 0.8 K/uL (ref 0.7–4.0)
MCH: 24.2 pg — ABNORMAL LOW (ref 26.0–34.0)
MCHC: 32.1 g/dL (ref 30.0–36.0)
MCV: 75.2 fL — ABNORMAL LOW (ref 80.0–100.0)
Monocytes Absolute: 0.7 K/uL (ref 0.1–1.0)
Monocytes Relative: 12 %
Neutro Abs: 4.2 K/uL (ref 1.7–7.7)
Neutrophils Relative %: 71 %
Platelets: 260 K/uL (ref 150–400)
RBC: 3.35 MIL/uL — ABNORMAL LOW (ref 4.22–5.81)
RDW: 18.4 % — ABNORMAL HIGH (ref 11.5–15.5)
WBC: 6 K/uL (ref 4.0–10.5)
nRBC: 0 % (ref 0.0–0.2)

## 2024-05-04 LAB — URINALYSIS, ROUTINE W REFLEX MICROSCOPIC
Bilirubin Urine: NEGATIVE
Glucose, UA: NEGATIVE mg/dL
Hgb urine dipstick: NEGATIVE
Ketones, ur: NEGATIVE mg/dL
Leukocytes,Ua: NEGATIVE
Nitrite: NEGATIVE
Protein, ur: 100 mg/dL — AB
Specific Gravity, Urine: 1.012 (ref 1.005–1.030)
pH: 6 (ref 5.0–8.0)

## 2024-05-04 LAB — COMPREHENSIVE METABOLIC PANEL WITH GFR
ALT: 6 U/L (ref 0–44)
AST: 15 U/L (ref 15–41)
Albumin: 4.4 g/dL (ref 3.5–5.0)
Alkaline Phosphatase: 84 U/L (ref 38–126)
Anion gap: 24 — ABNORMAL HIGH (ref 5–15)
BUN: 149 mg/dL — ABNORMAL HIGH (ref 8–23)
CO2: 16 mmol/L — ABNORMAL LOW (ref 22–32)
Calcium: 9.7 mg/dL (ref 8.9–10.3)
Chloride: 99 mmol/L (ref 98–111)
Creatinine, Ser: 14.2 mg/dL — ABNORMAL HIGH (ref 0.61–1.24)
GFR, Estimated: 3 mL/min — ABNORMAL LOW (ref >60)
Glucose, Bld: 109 mg/dL — ABNORMAL HIGH (ref 70–99)
Potassium: 4.4 mmol/L (ref 3.5–5.1)
Sodium: 139 mmol/L (ref 135–145)
Total Bilirubin: 0.5 mg/dL (ref 0.0–1.2)
Total Protein: 7.5 g/dL (ref 6.5–8.1)

## 2024-05-04 LAB — LIPASE, BLOOD: Lipase: 51 U/L (ref 11–51)

## 2024-05-04 NOTE — ED Provider Notes (Signed)
 [facility] AT [facility] Provider Note   CSN: [id number] Arrival date & time: 05/04/24  1528     Patient presents with: Mass   Marvin Garcia is a 68 y.o. male.   Patient complains of right sided abdominal mass and right sided abdominal discomfort.  Patient reports that he noticed swelling and pain beginning on Sunday of this week.  Patient states he has not had any fever or chills.  He had a normal bowel movement today.  He has not had any vomiting.  Patient states he had not noticed swelling until this week.  Patient's wife noted swelling as well.  Patient reports he has chronic kidney disease.  He is followed by Dr. Gearline of nephrology.  Patient has a history of anemia.  Patient has high blood pressure, osteoarthritis, anemia and stage V kidney disease.  The history is provided by the patient. No language interpreter was used.       Prior to Admission medications   Medication Sig Start Date End Date Taking? Authorizing Provider  acetaminophen  (TYLENOL ) 650 MG CR tablet Take 650-1,300 mg by mouth every 8 (eight) hours as needed for pain.    [provider]  amLODipine  (NORVASC ) 10 MG tablet Take 10 mg by mouth at bedtime.  10/03/19   [provider]  Ascorbic Acid  (VITAMIN C ) 1000 MG tablet Take 1,000 mg by mouth daily.    [provider]  aspirin  81 MG chewable tablet Chew 1 tablet (81 mg total) by mouth 2 (two) times daily. Patient taking differently: Chew 81 mg by mouth daily. 03/20/17   Swinteck, Redell, MD  atorvastatin  (LIPITOR) 20 MG tablet Take 20 mg by mouth every evening.     [provider]  calcitRIOL  (ROCALTROL ) 0.25 MCG capsule Take 0.25 mcg by mouth every other day.    [provider]  carvedilol  (COREG ) 25 MG tablet Take 25 mg by mouth 2 (two) times daily with a meal.    [provider]  cloNIDine  (CATAPRES ) 0.3 MG tablet Take 0.3 mg by mouth in the morning, at noon, and at bedtime.     [provider]  Coenzyme Q10 (COQ-10) 100 MG CAPS Take 100 mg by mouth daily.    [provider]  colchicine 0.6 MG tablet Take 0.6 mg by mouth daily as needed (for gout flare up).    [provider]  Febuxostat  80 MG TABS Take 80 mg by mouth daily.     [provider]  ferrous sulfate  325 (65 FE) MG tablet Take 325 mg by mouth daily.    [provider]  furosemide  (LASIX ) 40 MG tablet Take 40-80 mg by mouth See admin instructions. Take 80 mg by mouth every morning and take 40 mg by mouth every evening    [provider]  hydrALAZINE  (APRESOLINE ) 100 MG tablet Take 100 mg by mouth 3 (three) times daily.    [provider]  irbesartan  (AVAPRO ) 150 MG tablet Take 150 mg by mouth daily.    [provider]  isosorbide  mononitrate (IMDUR ) 30 MG 24 hr tablet Take 30 mg by mouth daily. 09/08/19   [provider]  methocarbamol  (ROBAXIN ) 500 MG tablet Take 1 tablet (500 mg total) by mouth 4 (four) times daily. 10/14/19   Stephen Olympia SAUNDERS, PA  Multiple Vitamin (MULTIVITAMIN WITH MINERALS) TABS tablet Take 1 tablet by mouth daily.    [provider]  Omega-3 Fatty Acids (OMEGA 3 500) 500 MG CAPS  Take 500 mg by mouth daily.    [provider]    Allergies: Hydrocodone    Review of Systems  Gastrointestinal:  Positive for abdominal distention and abdominal pain.  All other systems reviewed and are negative.   Updated Vital Signs BP (!) 143/88   Pulse 63   Temp 98.5 F (36.9 C)   Resp 15   Ht 5' 7 (1.702 m)   Wt 77.6 kg   SpO2 97%   BMI 26.78 kg/m   Physical Exam Vitals and nursing note reviewed.  Constitutional:      Appearance: He is well-developed.  HENT:     Head: Normocephalic.  Cardiovascular:     Rate and Rhythm: Normal rate.  Pulmonary:     Effort: Pulmonary effort is normal.  Abdominal:     General: Abdomen is flat. There is no distension.     Tenderness: There is abdominal  tenderness.     Comments: Large right sided abdominal mass approximately 20 x 20 cm.  Firm to palpation,  Musculoskeletal:        General: Normal range of motion.     Cervical back: Normal range of motion.  Skin:    General: Skin is warm.  Neurological:     General: No focal deficit present.     Mental Status: He is alert and oriented to person, place, and time.     (all labs ordered are listed, but only abnormal results are displayed) Labs Reviewed  COMPREHENSIVE METABOLIC PANEL WITH GFR - Abnormal; Notable for the following components:      Result Value   CO2 16 (*)    Glucose, Bld 109 (*)    BUN 149 (*)    Creatinine, Ser 14.20 (*)    GFR, Estimated 3 (*)    Anion gap 24 (*)    All other components within normal limits  CBC WITH DIFFERENTIAL/PLATELET - Abnormal; Notable for the following components:   RBC 3.35 (*)    Hemoglobin 8.1 (*)    HCT 25.2 (*)    MCV 75.2 (*)    MCH 24.2 (*)    RDW 18.4 (*)    All other components within normal limits  URINALYSIS, ROUTINE W REFLEX MICROSCOPIC - Abnormal; Notable for the following components:   Protein, ur 100 (*)    Bacteria, UA RARE (*)    All other components within normal limits  LIPASE, BLOOD    EKG: None  Radiology: CT ABDOMEN PELVIS WO CONTRAST Result Date: 05/04/2024 EXAM: CT ABDOMEN AND PELVIS WITHOUT CONTRAST 05/04/2024 07:16:37 PM TECHNIQUE: CT of the abdomen and pelvis was performed without the administration of intravenous contrast. Multiplanar reformatted images are provided for review. Automated exposure control, iterative reconstruction, and/or weight-based adjustment of the mA/kV was utilized to reduce the radiation dose to as low as reasonably achievable. COMPARISON: None available. CLINICAL HISTORY: Abdominal mass, palpable. Patient reports a hardened mass on the right side of the abdomen, denies pain, first noticed Sunday. Normal bowel movements, last bowel movement this morning. FINDINGS: LOWER CHEST: No  acute abnormality. LIVER: The liver is unremarkable. GALLBLADDER AND BILE DUCTS: Gallbladder is unremarkable. No biliary ductal dilatation. SPLEEN: No acute abnormality. PANCREAS: No acute abnormality. ADRENAL GLANDS: The adrenal glands are unremarkable. KIDNEYS, URETERS AND BLADDER: A 15.4 x 19.1 x 16.7 cm mass is seen involving the interpolar region of the right kidney, likely representing a primary renal cell carcinoma. Variable density is likely related to areas of central necrosis. Faint Dystrophic calcification  is seen throughout the mass. The mass demonstrates endophytic and exophytic extension with mass effect upon the adjacent gallbladder and ascending colon without evidence of direct invasion, as well as extension into the right renal pelvis without definite involvement of the right renal vein or proximal right ureter. No hydronephrosis of the right kidney is seen. The mass appears to extend beyond Gerota fascia laterally, where it abuts the peritoneal lining, with significant mass effect upon the inferior vena cava, which is slit-like in configuration. Multiple indeterminate exophytic cortical lesions are seen arising from the left kidney, measuring up to 15 mm, which are not well characterized on this noncontrast examination. No hydronephrosis on the left. No intrarenal or ureteral calculi. The bladder is partially obscured by artifact but is otherwise unremarkable. GI AND BOWEL: Appendix is normal. Stomach, small bowel, and large bowel are otherwise unremarkable. PERITONEUM AND RETROPERITONEUM: No ascites. No free air. VASCULATURE: The inferior vena cava is slit-like in configuration, best seen at image number 38, series number 2, without direct invasion of the structure. LYMPH NODES: No adjacent pathologic adenopathy is identified. REPRODUCTIVE ORGANS: No acute abnormality. BONES AND SOFT TISSUES: Left total hip arthroplasty has been performed. Osseous structures are otherwise age-appropriate. No lytic  or blastic bone lesions. IMPRESSION: 1. Large right renal mass (15.4 x 19.1 x 16.7 cm) with variable density, likely representing primary renal cell carcinoma with central necrosis. The mass demonstrates endophytic and exophytic extension, mass effect on the gallbladder and ascending colon without direct invasion, and extension into the right renal pelvis without definite involvement of the right renal vein or proximal right ureter. The mass also extends beyond Gerota fascia laterally, abutting the peritoneal lining, and causes significant mass effect on the inferior vena cava without direct invasion. No adjacent pathologic adenopathy or hydronephrosis. 2. Multiple indeterminate exophytic cortical lesions in the left kidney, measuring up to 15 mm, not well characterized on this noncontrast examination. Dedicated contrast-enhanced MRI examination is recommended for further evaluation. 3. Non-emergent CT examination of the chest is recommended for staging purposes. Electronically signed by: Dorethia Molt MD 05/04/2024 07:43 PM EDT RP Workstation: [id number]     Procedures   Medications Ordered in the ED - No data to display                                  Medical Decision Making Patient complains of swelling to the right side of his abdomen.  He noticed the swelling on the right side on Sunday.  Patient states that he now has some pressure and swelling on the left side as well  Amount and/or Complexity of Data Reviewed Independent Historian: spouse    Details: I spoke with the patient's wife who provides history that she has not noted patient having a mass until this week External Data Reviewed: notes.    Details: Nephrology notes from Dr. Gearline reviewed. Labs: ordered. Decision-making details documented in ED Course.    Details: Labs ordered reviewed and interpreted patient's hemoglobin is 8.1..  CO2 is 16 BUN is 149 creatinine is 14.2 GFR is 3 Radiology: ordered and independent interpretation  performed. Decision-making details documented in ED Course.    Details: CT shows large right renal mass with mass effect on inferior vena cava. Discussion of management or test interpretation with external provider(s): I spoke with Dr. Windle nephrology who advised patient will not need immediate dialysis.  He advised medicine admission nephrology will consult on  patient in the AM. Hospitalist consulted.  Dr. Franky  request Urology consult. I spoke with Dr. Renda.  He advised Urology will consult in am.    Risk Decision regarding hospitalization. Risk Details: I discussed findings with patient and his wife.  Patient is advised that he will need further evaluation.        Final diagnoses:  Renal mass  Acute kidney injury    ED Discharge Orders     None          Breann Losano K, PA-C 05/04/24 2201    Ruthe Cornet, DO 05/04/24 2241

## 2024-05-04 NOTE — ED Notes (Signed)
 Patient was given some warm blankets and water . Patients is aware he is being admitted. He was able to speak to his wife over the phone. No other needs at this time.

## 2024-05-04 NOTE — ED Triage Notes (Signed)
 Pt POV reporting hardened mass on R side of abd, denies pain, first noticed Sunday. Normal BM, last BM this morning.

## 2024-05-05 ENCOUNTER — Encounter (HOSPITAL_COMMUNITY): Payer: Self-pay | Admitting: Internal Medicine

## 2024-05-05 ENCOUNTER — Inpatient Hospital Stay (HOSPITAL_COMMUNITY)

## 2024-05-05 DIAGNOSIS — N179 Acute kidney failure, unspecified: Secondary | ICD-10-CM | POA: Diagnosis present

## 2024-05-05 DIAGNOSIS — N185 Chronic kidney disease, stage 5: Secondary | ICD-10-CM | POA: Diagnosis not present

## 2024-05-05 DIAGNOSIS — R918 Other nonspecific abnormal finding of lung field: Secondary | ICD-10-CM | POA: Diagnosis present

## 2024-05-05 DIAGNOSIS — I120 Hypertensive chronic kidney disease with stage 5 chronic kidney disease or end stage renal disease: Secondary | ICD-10-CM | POA: Diagnosis present

## 2024-05-05 DIAGNOSIS — D631 Anemia in chronic kidney disease: Secondary | ICD-10-CM | POA: Diagnosis present

## 2024-05-05 DIAGNOSIS — Z96642 Presence of left artificial hip joint: Secondary | ICD-10-CM | POA: Diagnosis present

## 2024-05-05 DIAGNOSIS — Z992 Dependence on renal dialysis: Secondary | ICD-10-CM | POA: Diagnosis not present

## 2024-05-05 DIAGNOSIS — C641 Malignant neoplasm of right kidney, except renal pelvis: Secondary | ICD-10-CM | POA: Diagnosis present

## 2024-05-05 DIAGNOSIS — R7989 Other specified abnormal findings of blood chemistry: Secondary | ICD-10-CM | POA: Diagnosis present

## 2024-05-05 DIAGNOSIS — E872 Acidosis, unspecified: Secondary | ICD-10-CM | POA: Diagnosis present

## 2024-05-05 DIAGNOSIS — I472 Ventricular tachycardia, unspecified: Secondary | ICD-10-CM | POA: Diagnosis not present

## 2024-05-05 DIAGNOSIS — I10 Essential (primary) hypertension: Secondary | ICD-10-CM | POA: Diagnosis not present

## 2024-05-05 DIAGNOSIS — Z825 Family history of asthma and other chronic lower respiratory diseases: Secondary | ICD-10-CM | POA: Diagnosis not present

## 2024-05-05 DIAGNOSIS — Z79899 Other long term (current) drug therapy: Secondary | ICD-10-CM | POA: Diagnosis not present

## 2024-05-05 DIAGNOSIS — Z96653 Presence of artificial knee joint, bilateral: Secondary | ICD-10-CM | POA: Diagnosis present

## 2024-05-05 DIAGNOSIS — Z833 Family history of diabetes mellitus: Secondary | ICD-10-CM | POA: Diagnosis not present

## 2024-05-05 DIAGNOSIS — Z96651 Presence of right artificial knee joint: Secondary | ICD-10-CM | POA: Diagnosis present

## 2024-05-05 DIAGNOSIS — I480 Paroxysmal atrial fibrillation: Secondary | ICD-10-CM | POA: Diagnosis present

## 2024-05-05 DIAGNOSIS — I4891 Unspecified atrial fibrillation: Secondary | ICD-10-CM | POA: Diagnosis not present

## 2024-05-05 DIAGNOSIS — I77810 Thoracic aortic ectasia: Secondary | ICD-10-CM | POA: Diagnosis present

## 2024-05-05 DIAGNOSIS — Z8249 Family history of ischemic heart disease and other diseases of the circulatory system: Secondary | ICD-10-CM | POA: Diagnosis not present

## 2024-05-05 DIAGNOSIS — E871 Hypo-osmolality and hyponatremia: Secondary | ICD-10-CM | POA: Diagnosis not present

## 2024-05-05 DIAGNOSIS — Z905 Acquired absence of kidney: Secondary | ICD-10-CM | POA: Diagnosis not present

## 2024-05-05 DIAGNOSIS — R066 Hiccough: Secondary | ICD-10-CM | POA: Diagnosis present

## 2024-05-05 DIAGNOSIS — I2510 Atherosclerotic heart disease of native coronary artery without angina pectoris: Secondary | ICD-10-CM | POA: Diagnosis present

## 2024-05-05 DIAGNOSIS — Z885 Allergy status to narcotic agent status: Secondary | ICD-10-CM | POA: Diagnosis not present

## 2024-05-05 DIAGNOSIS — N186 End stage renal disease: Secondary | ICD-10-CM | POA: Diagnosis present

## 2024-05-05 MED ORDER — CLONIDINE HCL 0.2 MG PO TABS
0.3000 mg | ORAL_TABLET | Freq: Three times a day (TID) | ORAL | Status: DC
Start: 2024-05-05 — End: 2024-05-06
  Administered 2024-05-05 – 2024-05-06 (×4): 0.3 mg via ORAL
  Filled 2024-05-05: qty 1
  Filled 2024-05-05: qty 3
  Filled 2024-05-05 (×3): qty 1

## 2024-05-05 MED ORDER — ONDANSETRON HCL 4 MG PO TABS: 4.0000 mg | ORAL_TABLET | Freq: Four times a day (QID) | ORAL | Status: DC | PRN

## 2024-05-05 MED ORDER — HYDRALAZINE HCL 25 MG PO TABS
100.0000 mg | ORAL_TABLET | Freq: Three times a day (TID) | ORAL | Status: DC
  Administered 2024-05-05 – 2024-05-11 (×18): 100 mg via ORAL
  Filled 2024-05-05 (×19): qty 4

## 2024-05-05 MED ORDER — ACETAMINOPHEN 325 MG PO TABS
650.0000 mg | ORAL_TABLET | Freq: Four times a day (QID) | ORAL | Status: DC | PRN
  Administered 2024-05-07: 650 mg via ORAL

## 2024-05-05 MED ORDER — ONDANSETRON HCL 4 MG/2ML IJ SOLN: 4.0000 mg | Freq: Four times a day (QID) | INTRAMUSCULAR | Status: DC | PRN

## 2024-05-05 MED ORDER — HEPARIN SODIUM (PORCINE) 5000 UNIT/ML IJ SOLN
5000.0000 [IU] | Freq: Three times a day (TID) | INTRAMUSCULAR | Status: DC
  Administered 2024-05-05 – 2024-05-07 (×7): 5000 [IU] via SUBCUTANEOUS
  Filled 2024-05-05 (×7): qty 1

## 2024-05-05 MED ORDER — ACETAMINOPHEN 500 MG PO TABS
1000.0000 mg | ORAL_TABLET | Freq: Once | ORAL | Status: AC
Start: 2024-05-05 — End: 2024-05-05
  Administered 2024-05-05: 1000 mg via ORAL
  Filled 2024-05-05: qty 2

## 2024-05-05 MED ORDER — CARVEDILOL 25 MG PO TABS
25.0000 mg | ORAL_TABLET | Freq: Two times a day (BID) | ORAL | Status: DC
Start: 2024-05-05 — End: 2024-05-06
  Administered 2024-05-05 – 2024-05-06 (×2): 25 mg via ORAL
  Filled 2024-05-05 (×3): qty 1

## 2024-05-05 MED ORDER — ACETAMINOPHEN 650 MG RE SUPP: 650.0000 mg | Freq: Four times a day (QID) | RECTAL | Status: DC | PRN

## 2024-05-05 MED ORDER — ISOSORBIDE MONONITRATE ER 30 MG PO TB24
30.0000 mg | ORAL_TABLET | Freq: Every day | ORAL | Status: DC
  Administered 2024-05-05 – 2024-05-06 (×2): 30 mg via ORAL
  Filled 2024-05-05 (×2): qty 1

## 2024-05-05 MED ORDER — SENNOSIDES-DOCUSATE SODIUM 8.6-50 MG PO TABS: 1.0000 | ORAL_TABLET | Freq: Every evening | ORAL | Status: DC | PRN

## 2024-05-05 NOTE — Hospital Course (Addendum)
 Marvin Garcia is a 68 y.o. male with PMH of chronic kidney disease stage V, hypertension presented with abdominal discomfort and fullness to [facility] ED. He satarted noticing bulge on left abdomen since Sunday. Patient otherwise denies any nausea, vomiting, chest pain, shortness of breath, fever, chills, headache, focal weakness, numbness tingling, speech difficulties. He has not had recetn weight loss, or night sweats or chills. At [facility] ZI:Cpujod were stable although BP on slightly higher side.  Labs showed bicarb 16 potassium 4.4 BUN/creat 149/14.2, normal LFTs hemoglobin 9>8.1, UA unremarkable CT abdomen pelvis without contrast>>large right-sided renal mass  15.4x19.1x16.7 concerning for renal cell carcinoma and creatinine was increased to 14.  ER physician discussed with on-call nephrologist Dr. Windle who will see patient in consult, as patient is making urine.Dr. Renda on-call urologist was also consulted who will be seeing the patient  Patient arrived to 5 W. on 9/25 afternoon On my exam he is aaox3 resting wlell and no complaints  Assessment and plan:  Progressive CKD 5 vs AKI on CKD Metabolic acidosis: Nephrology aware, currently no signs of fluid overload.  Add sodium bicarb p.o. monitor renal function, avoid nephrotoxic medication and renally dose meds. Allow po. Defer to Nephro if he needs IVF. He has had adequate oral intake, no diarrhea.  Large right-sided renal mass likely RCC: Urology has been consulted. I have notified Marvin Garcia- Will order restaging CT chest.  Patient has indeterminate exophytic cortical lesions in the left kidney   Anemia of renal disease: Monitor hemoglobin transfuse less than 7 g.  Hypertension: BP stable continue Coreg  clonidine  hydralazine  Imdur .  Holding amlodipine , ARB.  DVT prophylaxis: heparin  injection 5,000 Units Start: 05/05/24 1615 SCDs Start: 05/05/24 1524 Code Status:   Code Status: Full Code Family Communication: plan of care discussed  with patient at bedside. Patient status is: Remains hospitalized because of severity of illness Level of care: Progressive   Objective: Vitals last 24 hrs: Vitals:   05/05/24 1100 05/05/24 1134 05/05/24 1427 05/05/24 1428  BP: (!) 179/98 (!) 179/98  (!) 167/95  Pulse: 73 74    Resp: 11 14 (!) 21 17  Temp:  98.2 F (36.8 C) 98.5 F (36.9 C)   TempSrc:  Oral    SpO2: 97% 94% 95% 96%  Weight:      Height:        Physical Examination: General exam: alert awake, oriented, older than stated age HEENT:Oral mucosa moist, Ear/Nose WNL grossly Respiratory system: Bilaterally clear BS,no use of accessory muscle Cardiovascular system: S1 & S2 +, No JVD. Gastrointestinal system: Abdomen soft, swelling on RT side of abdomennand flank area,ND, BS+ Nervous System: Alert, awake, moving all extremities,and following commands. Extremities: extremities warm, leg edema neg Skin: No rashes,no icterus. MSK: Normal muscle bulk,tone, power   Medications reviewed:  Scheduled Meds:  carvedilol   25 mg Oral BID WC   cloNIDine   0.3 mg Oral TID   heparin   5,000 Units Subcutaneous Q8H   hydrALAZINE   100 mg Oral TID   isosorbide  mononitrate  30 mg Oral Daily   Continuous Infusions: Diet: Diet Order             Diet renal with fluid restriction Fluid restriction: 1500 mL Fluid; Room service appropriate? Yes; Fluid consistency: Thin  Diet effective now

## 2024-05-05 NOTE — ED Notes (Signed)
 [facility] called for transport

## 2024-05-05 NOTE — ED Notes (Signed)
 Pt still complaining of back pain from laying in the bed for a long time. Provided pt with a hot pack and an extra pillow with repositioning.

## 2024-05-05 NOTE — ED Notes (Signed)
 Patient was placed on the cardiac monitor and given some water  to drink. Patient continues to rest in bed.

## 2024-05-05 NOTE — ED Notes (Signed)
 Gave pt new warm blankets and emptied urinal. No other needs at this time.

## 2024-05-05 NOTE — H&P (Signed)
 History and Physical    Marvin Garcia FMW:[id number] DOB: [birth year] DOA: 05/04/2024  PCP: Patient, No Pcp Per   Patient coming from: Home to [facility] ED TO [facility] direct admit Chief Complaint  Patient presents with   Mass   Marvin Garcia is a 68 y.o. male with PMH of chronic kidney disease stage V, hypertension presented with abdominal discomfort and fullness to [facility] ED. He satarted noticing bulge on left abdomen since Sunday. Patient otherwise denies any nausea, vomiting, chest pain, shortness of breath, fever, chills, headache, focal weakness, numbness tingling, speech difficulties. He has not had recetn weight loss, or night sweats or chills. At [facility] ZI:Cpujod were stable although BP on slightly higher side.  Labs showed bicarb 16 potassium 4.4 BUN/creat 149/14.2, normal LFTs hemoglobin 9>8.1, UA unremarkable CT abdomen pelvis without contrast>>large right-sided renal mass  15.4x19.1x16.7 concerning for renal cell carcinoma and creatinine was increased to 14.  ER physician discussed with on-call nephrologist Dr. Windle who will see patient in consult, as patient is making urine.Dr. Renda on-call urologist was also consulted who will be seeing the patient  Patient arrived to 5 W. on 9/25 afternoon On my exam he is aaox3 resting wlell and no complaints  Assessment and plan:  Progressive CKD 5 vs AKI on CKD Metabolic acidosis: Nephrology aware, currently no signs of fluid overload.  Add sodium bicarb p.o. monitor renal function, avoid nephrotoxic medication and renally dose meds. Allow po. Defer to Nephro if he needs IVF. He has had adequate oral intake, no diarrhea.  Large right-sided renal mass likely RCC: Urology has been consulted. I have notified Cam Sattenfield- Will order restaging CT chest.  Patient has indeterminate exophytic cortical lesions in the left kidney   Anemia of renal disease: Monitor hemoglobin transfuse less than 7 g.  Hypertension: BP stable continue Coreg   clonidine  hydralazine  Imdur .  Holding amlodipine , ARB.  DVT prophylaxis: heparin  injection 5,000 Units Start: 05/05/24 1615 SCDs Start: 05/05/24 1524 Code Status:   Code Status: Full Code Family Communication: plan of care discussed with patient at bedside. Patient status is: Remains hospitalized because of severity of illness Level of care: Progressive   Objective: Vitals last 24 hrs: Vitals:   05/05/24 1100 05/05/24 1134 05/05/24 1427 05/05/24 1428  BP: (!) 179/98 (!) 179/98  (!) 167/95  Pulse: 73 74    Resp: 11 14 (!) 21 17  Temp:  98.2 F (36.8 C) 98.5 F (36.9 C)   TempSrc:  Oral    SpO2: 97% 94% 95% 96%  Weight:      Height:        Physical Examination: General exam: alert awake, oriented, older than stated age HEENT:Oral mucosa moist, Ear/Nose WNL grossly Respiratory system: Bilaterally clear BS,no use of accessory muscle Cardiovascular system: S1 & S2 +, No JVD. Gastrointestinal system: Abdomen soft, swelling on RT side of abdomennand flank area,ND, BS+ Nervous System: Alert, awake, moving all extremities,and following commands. Extremities: extremities warm, leg edema neg Skin: No rashes,no icterus. MSK: Normal muscle bulk,tone, power   Medications reviewed:  Scheduled Meds:  carvedilol   25 mg Oral BID WC   cloNIDine   0.3 mg Oral TID   heparin   5,000 Units Subcutaneous Q8H   hydrALAZINE   100 mg Oral TID   isosorbide  mononitrate  30 mg Oral Daily   Continuous Infusions: Diet: Diet Order             Diet renal with fluid restriction Fluid restriction: 1500 mL Fluid; Room service appropriate? Yes; Fluid consistency:  Thin  Diet effective now                     Severity of Illness: The appropriate patient status for this patient is INPATIENT. Inpatient status is judged to be reasonable and necessary in order to provide the required intensity of service to ensure the patient's safety. The patient's presenting symptoms, physical exam findings, and initial  radiographic and laboratory data in the context of their chronic comorbidities is felt to place them at high risk for further clinical deterioration. Furthermore, it is not anticipated that the patient will be medically stable for discharge from the hospital within 2 midnights of admission.   * I certify that at the point of admission it is my clinical judgment that the patient will require inpatient hospital care spanning beyond 2 midnights from the point of admission due to high intensity of service, high risk for further deterioration and high frequency of surveillance required.*  Family Communication: Admission, patients condition and plan of care including tests being ordered have been discussed with the patient  who indicate understanding and agree with the plan and Code Status.  Consults called:  Nephrology Urology  Review of Systems: All systems were reviewed and were negative except as mentioned in HPI above. Negative for fever Negative for chest pain Negative for shortness of breath  Past Medical History:  Diagnosis Date   Arrhythmia 1991   patient  describes that he woke up surrounded by doctors saying that his heart had stopped while sleeping; denies having pain during this event; also denies any reccurrent issues after that episode; says that was when they told me i had high blood pressure    Arthritis    Chronic kidney disease    stage IV managed by Dr Arthurine [facility]ash [facility]    Headache    Hypertension    Umbilical hernia     Past Surgical History:  Procedure Laterality Date   HERNIA REPAIR     inguinal    INSERTION OF MESH N/A 01/21/2017   Procedure: INSERTION OF MESH;  Surgeon: Signe Mitzie LABOR, MD;  Location: [facility];  Service: General;  Laterality: N/A;   JOINT REPLACEMENT     KNEE ARTHROSCOPY     TOTAL HIP ARTHROPLASTY Left 03/19/2017   Procedure: LEFT TOTAL HIP ARTHROPLASTY ANTERIOR APPROACH;  Surgeon: Fidel Rogue, MD;  Location: [facility];  Service:  Orthopedics;  Laterality: Left;  Needs RNFA   TOTAL KNEE ARTHROPLASTY  06/04/2012   Procedure: TOTAL KNEE ARTHROPLASTY;  Surgeon: Lamar Collet, MD;  Location: [facility];  Service: Orthopedics;  Laterality: Left;   TOTAL KNEE ARTHROPLASTY Right 10/14/2019   Procedure: TOTAL KNEE ARTHROPLASTY;  Surgeon: Collet Lamar, MD;  Location: [facility];  Service: Orthopedics;  Laterality: Right;  adductor canal   UMBILICAL HERNIA REPAIR N/A 01/21/2017   Procedure: UMBILICAL HERNIA REPAIR WITH MESH;  Surgeon: Signe Mitzie LABOR, MD;  Location: [facility];  Service: General;  Laterality: N/A;     reports that he has never smoked. He has never used smokeless tobacco. He reports that he does not drink alcohol and does not use drugs.  Allergies  Allergen Reactions   Hydrocodone Other (See Comments)    Severe hiccups    Family History  Problem Relation Age of Onset   Diabetes Mother    Heart disease Father    Asthma Son    Diabetes Maternal Grandmother      Prior to Admission medications   Medication Sig  Start Date End Date Taking? Authorizing Provider  acetaminophen  (TYLENOL ) 650 MG CR tablet Take 650-1,300 mg by mouth every 8 (eight) hours as needed for pain.   Yes [provider]  amLODipine  (NORVASC ) 10 MG tablet Take 10 mg by mouth at bedtime.  10/03/19  Yes [provider]  Ascorbic Acid  (VITAMIN C ) 1000 MG tablet Take 1,000 mg by mouth daily.   Yes [provider]  carvedilol  (COREG ) 25 MG tablet Take 25 mg by mouth 2 (two) times daily with a meal.   Yes [provider]  cloNIDine  (CATAPRES ) 0.3 MG tablet Take 0.3 mg by mouth in the morning, at noon, and at bedtime.   Yes [provider]  Coenzyme Q10 (COQ-10) 100 MG CAPS Take 100 mg by mouth daily.   Yes [provider]  Febuxostat  80 MG TABS Take 80 mg by mouth daily.    Yes [provider]  hydrALAZINE  (APRESOLINE ) 100 MG tablet Take 100 mg by mouth 3 (three) times daily.   Yes [provider]  isosorbide  mononitrate (IMDUR ) 30 MG 24 hr tablet Take 30 mg by mouth daily. 09/08/19  Yes [provider]  Multiple Vitamin (MULTIVITAMIN WITH MINERALS) TABS tablet Take 1 tablet by mouth daily. *alive mens 50+*   Yes [provider]  Omega-3 Fatty Acids (OMEGA 3 500) 500 MG CAPS Take 500 mg by mouth daily.   Yes [provider]  OVER THE COUNTER MEDICATION Take 1 tablet by mouth daily. **vitra-cq10-100 mg*   Yes [provider]  OVER THE COUNTER MEDICATION Take 1 tablet by mouth daily. **ultimate iron 50 mg**   Yes [provider]  aspirin  81 MG chewable tablet Chew 1 tablet (81 mg total) by mouth 2 (two) times daily. Patient not taking: Reported on 05/05/2024 03/20/17   Fidel Rogue, MD  irbesartan  (AVAPRO ) 150 MG tablet Take 150 mg by mouth daily.    [provider]  methocarbamol  (ROBAXIN ) 500 MG tablet Take 1 tablet (500 mg total) by mouth 4 (four) times daily. Patient not taking: Reported on 05/05/2024 10/14/19   Stephen Olympia SAUNDERS, PA   r   Labs on Admission: I have personally reviewed following labs and imaging studies  CBC: Recent Labs  Lab 05/03/24 1039 05/04/24 1552  WBC  --  6.0  NEUTROABS  --  4.2  HGB 9.0* 8.1*  HCT  --  25.2*  MCV  --  75.2*  PLT  --  260   Basic Metabolic Panel: Recent Labs  Lab 05/04/24 1552  NA 139  K 4.4  CL 99  CO2 16*  GLUCOSE 109*  BUN 149*  CREATININE 14.20*  CALCIUM  9.7   Estimated Creatinine Clearance: 4.7 mL/min (A) (by C-G formula based on SCr of 14.2 mg/dL (H)). Recent Labs  Lab 05/04/24 1552  AST 15  ALT 6  ALKPHOS 84  BILITOT 0.5  PROT 7.5  ALBUMIN 4.4   Recent Labs  Lab 05/04/24 1552  LIPASE 51   No results for input(s): AMMONIA in the last 168 hours. Coagulation Profile: No results for input(s): INR, PROTIME in the last 168 hours. Cardiac Panel (last 3 results) No results for input(s): CKTOTAL, CKMB, TROPONINIHS, RELINDX in the  last 72 hours.  BNP (last 3 results) No results for input(s): PROBNP in the last 8760 hours. HbA1C: No results for input(s): HGBA1C in the last 72 hours. CBG: No results for input(s): GLUCAP in the last 168 hours. Lipid Profile: No results  for input(s): CHOL, HDL, LDLCALC, TRIG, CHOLHDL, LDLDIRECT in the last 72 hours. Thyroid Function Tests: No results for input(s): TSH, T4TOTAL, FREET4, T3FREE, THYROIDAB in the last 72 hours. Urine analysis:    Component Value Date/Time   COLORURINE YELLOW 05/04/2024 1857   APPEARANCEUR CLEAR 05/04/2024 1857   LABSPEC 1.012 05/04/2024 1857   PHURINE 6.0 05/04/2024 1857   GLUCOSEU NEGATIVE 05/04/2024 1857   HGBUR NEGATIVE 05/04/2024 1857   BILIRUBINUR NEGATIVE 05/04/2024 1857   KETONESUR NEGATIVE 05/04/2024 1857   PROTEINUR 100 (A) 05/04/2024 1857   UROBILINOGEN 0.2 05/31/2012 1410   NITRITE NEGATIVE 05/04/2024 1857   LEUKOCYTESUR NEGATIVE 05/04/2024 1857    Radiological Exams on Admission: CT ABDOMEN PELVIS WO CONTRAST Result Date: 05/04/2024 EXAM: CT ABDOMEN AND PELVIS WITHOUT CONTRAST 05/04/2024 07:16:37 PM TECHNIQUE: CT of the abdomen and pelvis was performed without the administration of intravenous contrast. Multiplanar reformatted images are provided for review. Automated exposure control, iterative reconstruction, and/or weight-based adjustment of the mA/kV was utilized to reduce the radiation dose to as low as reasonably achievable. COMPARISON: None available. CLINICAL HISTORY: Abdominal mass, palpable. Patient reports a hardened mass on the right side of the abdomen, denies pain, first noticed Sunday. Normal bowel movements, last bowel movement this morning. FINDINGS: LOWER CHEST: No acute abnormality. LIVER: The liver is unremarkable. GALLBLADDER AND BILE DUCTS: Gallbladder is unremarkable. No biliary ductal dilatation. SPLEEN: No acute abnormality. PANCREAS: No acute abnormality. ADRENAL GLANDS: The adrenal  glands are unremarkable. KIDNEYS, URETERS AND BLADDER: A 15.4 x 19.1 x 16.7 cm mass is seen involving the interpolar region of the right kidney, likely representing a primary renal cell carcinoma. Variable density is likely related to areas of central necrosis. Faint Dystrophic calcification is seen throughout the mass. The mass demonstrates endophytic and exophytic extension with mass effect upon the adjacent gallbladder and ascending colon without evidence of direct invasion, as well as extension into the right renal pelvis without definite involvement of the right renal vein or proximal right ureter. No hydronephrosis of the right kidney is seen. The mass appears to extend beyond Gerota fascia laterally, where it abuts the peritoneal lining, with significant mass effect upon the inferior vena cava, which is slit-like in configuration. Multiple indeterminate exophytic cortical lesions are seen arising from the left kidney, measuring up to 15 mm, which are not well characterized on this noncontrast examination. No hydronephrosis on the left. No intrarenal or ureteral calculi. The bladder is partially obscured by artifact but is otherwise unremarkable. GI AND BOWEL: Appendix is normal. Stomach, small bowel, and large bowel are otherwise unremarkable. PERITONEUM AND RETROPERITONEUM: No ascites. No free air. VASCULATURE: The inferior vena cava is slit-like in configuration, best seen at image number 38, series number 2, without direct invasion of the structure. LYMPH NODES: No adjacent pathologic adenopathy is identified. REPRODUCTIVE ORGANS: No acute abnormality. BONES AND SOFT TISSUES: Left total hip arthroplasty has been performed. Osseous structures are otherwise age-appropriate. No lytic or blastic bone lesions. IMPRESSION: 1. Large right renal mass (15.4 x 19.1 x 16.7 cm) with variable density, likely representing primary renal cell carcinoma with central necrosis. The mass demonstrates endophytic and exophytic  extension, mass effect on the gallbladder and ascending colon without direct invasion, and extension into the right renal pelvis without definite involvement of the right renal vein or proximal right ureter. The mass also extends beyond Gerota fascia laterally, abutting the peritoneal lining, and causes significant mass effect on the inferior vena cava without direct invasion. No adjacent pathologic adenopathy  or hydronephrosis. 2. Multiple indeterminate exophytic cortical lesions in the left kidney, measuring up to 15 mm, not well characterized on this noncontrast examination. Dedicated contrast-enhanced MRI examination is recommended for further evaluation. 3. Non-emergent CT examination of the chest is recommended for staging purposes. Electronically signed by: Dorethia Molt MD 05/04/2024 07:43 PM EDT RP Workstation: [id number]   Mennie LAMY MD [facility]  If 7PM-7AM, please contact night-coverage [email or web address]  05/05/2024, 3:25 PM

## 2024-05-05 NOTE — Consult Note (Signed)
 [facility]  INPATIENT CONSULTATION  Reason for Consultation: CKD 5 Requesting Provider: Dr. Franky  HPI: Marvin Garcia is an 68 y.o. male with CKD 5, HTN, OA, anemia being admitted with a new likely advanced renal mass and nephrology is consulted for CKD 5.    Patient presented to [facility] yesterday with a several day history of abd pain and fullness.  CT without contrast showed large R renal mass with extension, mass effect on IVC.  Labs show K 4.4, Bicarb 16, BUN 149, Cr 14, eGFR 3, Hb 8.1. Afebrile, BP in the 160s, on RA.  UOP 2.3L.  Transferred to [facility] for admission.  Urology has been consulted and will see patient Friday AM.  He currently feels ok.  Awaiting staging CT scan chest.  He has very mild dysgeusia but it tolerating most oral intake, +pruritus but doesn't prevent sleep.  Denies cognitive issues, hiccups.  No difficulty voiding.  No LE edema.  Fatigued with exertion.     He follows with Dr. Gearline at [facility] last OV 4 weeks ago.  eGFR 3 at that time.  At previous visits he had never planned to do dialysis but at that visit seemed to be considering it as a bridge to transplant.    PMH: Past Medical History:  Diagnosis Date   Arrhythmia 1991   patient  describes that he woke up surrounded by doctors saying that his heart had stopped while sleeping; denies having pain during this event; also denies any reccurrent issues after that episode; says that was when they told me i had high blood pressure    Arthritis    Chronic kidney disease    stage IV managed by Dr Arthurine leash kidney    Headache    Hypertension    Umbilical hernia    PSH: Past Surgical History:  Procedure Laterality Date   HERNIA REPAIR     inguinal    INSERTION OF MESH N/A 01/21/2017   Procedure: INSERTION OF MESH;  Surgeon: Signe Mitzie LABOR, MD;  Location: [facility];  Service: General;  Laterality: N/A;   JOINT REPLACEMENT     KNEE ARTHROSCOPY     TOTAL HIP ARTHROPLASTY Left 03/19/2017    Procedure: LEFT TOTAL HIP ARTHROPLASTY ANTERIOR APPROACH;  Surgeon: Fidel Rogue, MD;  Location: [facility];  Service: Orthopedics;  Laterality: Left;  Needs RNFA   TOTAL KNEE ARTHROPLASTY  06/04/2012   Procedure: TOTAL KNEE ARTHROPLASTY;  Surgeon: Lamar Collet, MD;  Location: [facility];  Service: Orthopedics;  Laterality: Left;   TOTAL KNEE ARTHROPLASTY Right 10/14/2019   Procedure: TOTAL KNEE ARTHROPLASTY;  Surgeon: Collet Lamar, MD;  Location: [facility];  Service: Orthopedics;  Laterality: Right;  adductor canal   UMBILICAL HERNIA REPAIR N/A 01/21/2017   Procedure: UMBILICAL HERNIA REPAIR WITH MESH;  Surgeon: Signe Mitzie LABOR, MD;  Location: [facility];  Service: General;  Laterality: N/A;    Past Medical History:  Diagnosis Date   Arrhythmia 1991   patient  describes that he woke up surrounded by doctors saying that his heart had stopped while sleeping; denies having pain during this event; also denies any reccurrent issues after that episode; says that was when they told me i had high blood pressure    Arthritis    Chronic kidney disease    stage IV managed by Dr Arthurine leash kidney    Headache    Hypertension    Umbilical hernia     Medications:  I have reviewed the patient's current  medications.  Medications Prior to Admission  Medication Sig Dispense Refill   acetaminophen  (TYLENOL ) 650 MG CR tablet Take 650-1,300 mg by mouth every 8 (eight) hours as needed for pain.     amLODipine  (NORVASC ) 10 MG tablet Take 10 mg by mouth at bedtime.      Ascorbic Acid  (VITAMIN C ) 1000 MG tablet Take 1,000 mg by mouth daily.     carvedilol  (COREG ) 25 MG tablet Take 25 mg by mouth 2 (two) times daily with a meal.     cloNIDine  (CATAPRES ) 0.3 MG tablet Take 0.3 mg by mouth in the morning, at noon, and at bedtime.     Coenzyme Q10 (COQ-10) 100 MG CAPS Take 100 mg by mouth daily.     Febuxostat  80 MG TABS Take 80 mg by mouth daily.      hydrALAZINE  (APRESOLINE ) 100 MG tablet Take 100 mg by mouth  3 (three) times daily.     isosorbide  mononitrate (IMDUR ) 30 MG 24 hr tablet Take 30 mg by mouth daily.     Multiple Vitamin (MULTIVITAMIN WITH MINERALS) TABS tablet Take 1 tablet by mouth daily. *alive mens 50+*     Omega-3 Fatty Acids (OMEGA 3 500) 500 MG CAPS Take 500 mg by mouth daily.     OVER THE COUNTER MEDICATION Take 1 tablet by mouth daily. **vitra-cq10-100 mg*     OVER THE COUNTER MEDICATION Take 1 tablet by mouth daily. **ultimate iron 50 mg**     aspirin  81 MG chewable tablet Chew 1 tablet (81 mg total) by mouth 2 (two) times daily. (Patient not taking: Reported on 05/05/2024) 6 tablet 1   irbesartan  (AVAPRO ) 150 MG tablet Take 150 mg by mouth daily.     methocarbamol  (ROBAXIN ) 500 MG tablet Take 1 tablet (500 mg total) by mouth 4 (four) times daily. (Patient not taking: Reported on 05/05/2024) 40 tablet 0    ALLERGIES:   Allergies  Allergen Reactions   Hydrocodone Other (See Comments)    Severe hiccups    FAM HX: Family History  Problem Relation Age of Onset   Diabetes Mother    Heart disease Father    Asthma Son    Diabetes Maternal Grandmother     Social History:   reports that he has never smoked. He has never used smokeless tobacco. He reports that he does not drink alcohol and does not use drugs.  ROS: 12 system ROS neg except per HPI above  Blood pressure (!) 167/95, pulse 74, temperature 98.5 F (36.9 C), resp. rate 17, height 5' 7 (1.702 m), weight 77.6 kg, SpO2 96%. PHYSICAL EXAM: Gen: comfortable, awake and alert  Eyes:  EOMI ENT: MMM Neck: supple, no JVD CV: RRR, II/VI SEM, no rub Abd:  soft, nontender Lungs: clear GU: no foley Extr: no edema Neuro:no asterixis, conversant, attentive Skin: no rashes or lesions   Results for orders placed or performed during the hospital encounter of 05/04/24 (from the past 48 hours)  Comprehensive metabolic panel     Status: Abnormal   Collection Time: 05/04/24  3:52 PM  Result Value Ref Range   Sodium 139  135 - 145 mmol/L   Potassium 4.4 3.5 - 5.1 mmol/L   Chloride 99 98 - 111 mmol/L   CO2 16 (L) 22 - 32 mmol/L   Glucose, Bld 109 (H) 70 - 99 mg/dL    Comment: Glucose reference range applies only to samples taken after fasting for at least 8 hours.   BUN 149 (H)  8 - 23 mg/dL   Creatinine, Ser 85.79 (H) 0.61 - 1.24 mg/dL   Calcium  9.7 8.9 - 10.3 mg/dL   Total Protein 7.5 6.5 - 8.1 g/dL   Albumin 4.4 3.5 - 5.0 g/dL   AST 15 15 - 41 U/L   ALT 6 0 - 44 U/L   Alkaline Phosphatase 84 38 - 126 U/L   Total Bilirubin 0.5 0.0 - 1.2 mg/dL   GFR, Estimated 3 (L) >60 mL/min    Comment: (NOTE) Calculated using the CKD-EPI Creatinine Equation (2021)    Anion gap 24 (H) 5 - 15    Comment: Performed at [facility], [address] [place], [place] [address]  CBC with Differential     Status: Abnormal   Collection Time: 05/04/24  3:52 PM  Result Value Ref Range   WBC 6.0 4.0 - 10.5 K/uL   RBC 3.35 (L) 4.22 - 5.81 MIL/uL   Hemoglobin 8.1 (L) 13.0 - 17.0 g/dL    Comment: Reticulocyte Hemoglobin testing may be clinically indicated, consider ordering this additional test [id number]    HCT 25.2 (L) 39.0 - 52.0 %   MCV 75.2 (L) 80.0 - 100.0 fL   MCH 24.2 (L) 26.0 - 34.0 pg   MCHC 32.1 30.0 - 36.0 g/dL   RDW 81.5 (H) 88.4 - 84.4 %   Platelets 260 150 - 400 K/uL   nRBC 0.0 0.0 - 0.2 %   Neutrophils Relative % 71 %   Neutro Abs 4.2 1.7 - 7.7 K/uL   Lymphocytes Relative 14 %   Lymphs Abs 0.8 0.7 - 4.0 K/uL   Monocytes Relative 12 %   Monocytes Absolute 0.7 0.1 - 1.0 K/uL   Eosinophils Relative 2 %   Eosinophils Absolute 0.1 0.0 - 0.5 K/uL   Basophils Relative 0 %   Basophils Absolute 0.0 0.0 - 0.1 K/uL   Immature Granulocytes 1 %   Abs Immature Granulocytes 0.05 0.00 - 0.07 K/uL    Comment: Performed at [facility], [address] [place], [place] [address]  Lipase, blood     Status: None   Collection Time: 05/04/24  3:52 PM  Result Value Ref Range    Lipase 51 11 - 51 U/L    Comment: Performed at [facility], [address], [place], [place] [address]  Urinalysis, Routine w reflex microscopic -Urine, Clean Catch     Status: Abnormal   Collection Time: 05/04/24  6:57 PM  Result Value Ref Range   Color, Urine YELLOW YELLOW   APPearance CLEAR CLEAR   Specific Gravity, Urine 1.012 1.005 - 1.030   pH 6.0 5.0 - 8.0   Glucose, UA NEGATIVE NEGATIVE mg/dL   Hgb urine dipstick NEGATIVE NEGATIVE   Bilirubin Urine NEGATIVE NEGATIVE   Ketones, ur NEGATIVE NEGATIVE mg/dL   Protein, ur 899 (A) NEGATIVE mg/dL   Nitrite NEGATIVE NEGATIVE   Leukocytes,Ua NEGATIVE NEGATIVE   RBC / HPF 0-5 0 - 5 RBC/hpf   WBC, UA 0-5 0 - 5 WBC/hpf   Bacteria, UA RARE (A) NONE SEEN   Squamous Epithelial / HPF 0-5 0 - 5 /HPF   Mucus PRESENT     Comment: Performed at [facility], [address], [place], [place] [address]    CT ABDOMEN PELVIS WO CONTRAST Result Date: 05/04/2024 EXAM: CT ABDOMEN AND PELVIS WITHOUT CONTRAST 05/04/2024 07:16:37 PM TECHNIQUE: CT of the abdomen and pelvis was performed without the administration of intravenous contrast. Multiplanar reformatted images are  provided for review. Automated exposure control, iterative reconstruction, and/or weight-based adjustment of the mA/kV was utilized to reduce the radiation dose to as low as reasonably achievable. COMPARISON: None available. CLINICAL HISTORY: Abdominal mass, palpable. Patient reports a hardened mass on the right side of the abdomen, denies pain, first noticed Sunday. Normal bowel movements, last bowel movement this morning. FINDINGS: LOWER CHEST: No acute abnormality. LIVER: The liver is unremarkable. GALLBLADDER AND BILE DUCTS: Gallbladder is unremarkable. No biliary ductal dilatation. SPLEEN: No acute abnormality. PANCREAS: No acute abnormality. ADRENAL GLANDS: The adrenal glands are unremarkable. KIDNEYS, URETERS AND BLADDER: A 15.4 x 19.1 x 16.7 cm mass  is seen involving the interpolar region of the right kidney, likely representing a primary renal cell carcinoma. Variable density is likely related to areas of central necrosis. Faint Dystrophic calcification is seen throughout the mass. The mass demonstrates endophytic and exophytic extension with mass effect upon the adjacent gallbladder and ascending colon without evidence of direct invasion, as well as extension into the right renal pelvis without definite involvement of the right renal vein or proximal right ureter. No hydronephrosis of the right kidney is seen. The mass appears to extend beyond Gerota fascia laterally, where it abuts the peritoneal lining, with significant mass effect upon the inferior vena cava, which is slit-like in configuration. Multiple indeterminate exophytic cortical lesions are seen arising from the left kidney, measuring up to 15 mm, which are not well characterized on this noncontrast examination. No hydronephrosis on the left. No intrarenal or ureteral calculi. The bladder is partially obscured by artifact but is otherwise unremarkable. GI AND BOWEL: Appendix is normal. Stomach, small bowel, and large bowel are otherwise unremarkable. PERITONEUM AND RETROPERITONEUM: No ascites. No free air. VASCULATURE: The inferior vena cava is slit-like in configuration, best seen at image number 38, series number 2, without direct invasion of the structure. LYMPH NODES: No adjacent pathologic adenopathy is identified. REPRODUCTIVE ORGANS: No acute abnormality. BONES AND SOFT TISSUES: Left total hip arthroplasty has been performed. Osseous structures are otherwise age-appropriate. No lytic or blastic bone lesions. IMPRESSION: 1. Large right renal mass (15.4 x 19.1 x 16.7 cm) with variable density, likely representing primary renal cell carcinoma with central necrosis. The mass demonstrates endophytic and exophytic extension, mass effect on the gallbladder and ascending colon without direct  invasion, and extension into the right renal pelvis without definite involvement of the right renal vein or proximal right ureter. The mass also extends beyond Gerota fascia laterally, abutting the peritoneal lining, and causes significant mass effect on the inferior vena cava without direct invasion. No adjacent pathologic adenopathy or hydronephrosis. 2. Multiple indeterminate exophytic cortical lesions in the left kidney, measuring up to 15 mm, not well characterized on this noncontrast examination. Dedicated contrast-enhanced MRI examination is recommended for further evaluation. 3. Non-emergent CT examination of the chest is recommended for staging purposes. Electronically signed by: Dorethia Molt MD 05/04/2024 07:43 PM EDT RP Workstation: [id number]    Assessment/PlanHolden Garcia is an 68 y.o. male with CKD 5, HTN, OA, anemia being admitted with a new large renal mass and nephrology is consulted for CKD 5.    **Renal Mass: concerning for RCCa, discussed with him plainly today this is likely cancer and urology will see him to go over imaging, treatment options, prognosis.    **CKD 5:  longstanding and progressive CKD followed with Dr. Gearline, stable compared w 1 month ago. Mild uremic symptoms, no urgent indications for dialysis.  I recommended he consult with urology about his  mass/prognosis before we commit to any plan regarding dialysis.   Certainly I would expect if any treatment is pursued for his renal mass, he'll need to start dialysis.  He currently looks euvolemic and no IVF indicated.  Avoid nephrotoxins.    **AGMA: in the setting of advanced CKD.  Bicarb 16, has been started on oral bicarb.   **Anemia:  Hb 8-9s baseline, currently 8.1.  Holding any ESA.   Recent iron sat 13% can give IV iron during admission.   **HTN: resumed home meds.  Follow.  Nephrology will follow, reach out with concerns.   Camarion Weier A Morganne Haile 05/05/2024, 2:37 PM

## 2024-05-06 DIAGNOSIS — N179 Acute kidney failure, unspecified: Secondary | ICD-10-CM | POA: Diagnosis not present

## 2024-05-06 LAB — BASIC METABOLIC PANEL WITH GFR
Anion gap: 19 — ABNORMAL HIGH (ref 5–15)
BUN: 157 mg/dL — ABNORMAL HIGH (ref 8–23)
CO2: 16 mmol/L — ABNORMAL LOW (ref 22–32)
Calcium: 9.1 mg/dL (ref 8.9–10.3)
Chloride: 102 mmol/L (ref 98–111)
Creatinine, Ser: 15.64 mg/dL — ABNORMAL HIGH (ref 0.61–1.24)
GFR, Estimated: 3 mL/min — ABNORMAL LOW (ref >60)
Glucose, Bld: 83 mg/dL (ref 70–99)
Potassium: 3.9 mmol/L (ref 3.5–5.1)
Sodium: 137 mmol/L (ref 135–145)

## 2024-05-06 LAB — CBC
HCT: 26.4 % — ABNORMAL LOW (ref 39.0–52.0)
Hemoglobin: 8.6 g/dL — ABNORMAL LOW (ref 13.0–17.0)
MCH: 24.2 pg — ABNORMAL LOW (ref 26.0–34.0)
MCHC: 32.6 g/dL (ref 30.0–36.0)
MCV: 74.4 fL — ABNORMAL LOW (ref 80.0–100.0)
Platelets: 275 K/uL (ref 150–400)
RBC: 3.55 MIL/uL — ABNORMAL LOW (ref 4.22–5.81)
RDW: 18.8 % — ABNORMAL HIGH (ref 11.5–15.5)
WBC: 7 K/uL (ref 4.0–10.5)
nRBC: 1.7 % — ABNORMAL HIGH (ref 0.0–0.2)

## 2024-05-06 LAB — PROTIME-INR
INR: 1.2 (ref 0.8–1.2)
Prothrombin Time: 15.4 s — ABNORMAL HIGH (ref 11.4–15.2)

## 2024-05-06 LAB — HIV ANTIBODY (ROUTINE TESTING W REFLEX): HIV Screen 4th Generation wRfx: NONREACTIVE

## 2024-05-06 MED ORDER — CLONIDINE HCL 0.2 MG PO TABS
0.2000 mg | ORAL_TABLET | Freq: Three times a day (TID) | ORAL | Status: DC
Start: 2024-05-06 — End: 2024-05-08
  Administered 2024-05-06 – 2024-05-07 (×3): 0.2 mg via ORAL
  Filled 2024-05-06 (×3): qty 1

## 2024-05-06 MED ORDER — CARVEDILOL 12.5 MG PO TABS
12.5000 mg | ORAL_TABLET | Freq: Two times a day (BID) | ORAL | Status: DC
Start: 2024-05-07 — End: 2024-05-08
  Administered 2024-05-07: 12.5 mg via ORAL
  Filled 2024-05-06: qty 1

## 2024-05-06 MED ORDER — HYDRALAZINE HCL 20 MG/ML IJ SOLN: 10.0000 mg | Freq: Four times a day (QID) | INTRAMUSCULAR | Status: DC | PRN

## 2024-05-06 MED ORDER — ISOSORBIDE MONONITRATE ER 60 MG PO TB24
60.0000 mg | ORAL_TABLET | Freq: Every day | ORAL | Status: DC
  Administered 2024-05-07 – 2024-05-11 (×5): 60 mg via ORAL
  Filled 2024-05-06 (×5): qty 1

## 2024-05-06 NOTE — Progress Notes (Signed)
 Admit: 05/04/2024 LOS: 1  56M progressive CKD5 admitted with large R renal mass concering for RCC  Subjective:  No interval events No urology notes yet Creatinine and BUN about the same, slightly worse, K3.9, bicarbonate 16 with an anion gap of 19  09/25 0701 - 09/26 0700 In: -  Out: 1400 [Urine:1400]  Filed Weights   05/04/24 1547  Weight: 77.6 kg    Scheduled Meds:  carvedilol   25 mg Oral BID WC   cloNIDine   0.3 mg Oral TID   heparin   5,000 Units Subcutaneous Q8H   hydrALAZINE   100 mg Oral TID   isosorbide  mononitrate  30 mg Oral Daily   Continuous Infusions: PRN Meds:.acetaminophen  **OR** acetaminophen , ondansetron  **OR** ondansetron  (ZOFRAN ) IV, senna-docusate  Current Labs: reviewed   Physical Exam:  Blood pressure 135/71, pulse 66, temperature 98 F (36.7 C), temperature source Oral, resp. rate 20, height 5' 7 (1.702 m), weight 77.6 kg, SpO2 90%. Gen: comfortable, awake and alert  Eyes:  EOMI ENT: MMM Neck: supple, no JVD CV: RRR, II/VI SEM, no rub Abd:  soft, nontender Lungs: clear GU: no foley Extr: no edema Neuro:no asterixis, conversant, attentive Skin: no rashes or lesions  A Longstanding and progressive CKD 5 with significant azotemia followed by Dr. Gearline Large right renal mass concerning for extensive RCC, urology consulted AGMA in the setting of #1, monitor Anemia: Holding ESA given #2.  TSAT 13%.  Monitor. Hypertension, monitor on current regimen  P Await urology input Either he needs dialysis with surgery or transition to hospice.  He would like to think about this and I will follow-up with him again tomorrow.  Addntially await input from urology. Medication Issues; Preferred narcotic agents for pain control are hydromorphone , fentanyl , and methadone. Morphine should not be used.  Baclofen should be avoided Avoid oral sodium phosphate  and magnesium  citrate based laxatives / bowel preps    Bernardino Gasman MD 05/06/2024, 1:13 PM  Recent Labs   Lab 05/04/24 1552 05/06/24 0258  NA 139 137  K 4.4 3.9  CL 99 102  CO2 16* 16*  GLUCOSE 109* 83  BUN 149* 157*  CREATININE 14.20* 15.64*  CALCIUM  9.7 9.1   Recent Labs  Lab 05/03/24 1039 05/04/24 1552 05/06/24 0258  WBC  --  6.0 7.0  NEUTROABS  --  4.2  --   HGB 9.0* 8.1* 8.6*  HCT  --  25.2* 26.4*  MCV  --  75.2* 74.4*  PLT  --  260 275

## 2024-05-06 NOTE — Consult Note (Signed)
 Urology Consult Note   Requesting Attending Physician:  Dennise Lavada POUR, MD Service Providing Consult: Urology  Consulting Attending: Dr. Watt   Reason for Consult:    HPI: Marvin Garcia is seen in consultation for reasons noted above at the request of Dennise Lavada POUR, MD. Patient is a 68 y.o. male presenting to [facility] emergency department with bulging left abdomen since Sunday.  Patient reports abdominal discomfort and fullness.  CT A/P noted a very large right sided renal mass most likely representing RCC-15.4 x 19.1 x 16.7.  This has most likely been growing for many years.  His IVC is suffering from considerable mass effect but there does not appear to be any tumor thrombus.  [facility] was consulted for surgical consideration.  On my arrival patient was alert, oriented, and in no distress.  His wife accompanied us  by phone and we reviewed the available options.  All questions that could be answered at this time were.  Following up after discussing with surgical team.  ------------------  Assessment:   68 y.o. male with large right 15.4 x 19.1 x 16.7 renal mass most likely representing renal cell carcinoma.   Recommendations: # large right renal mass # CKD V  I saw patient as discussed this morning.  I was unable to commit a surgeon to his nephrectomy without speaking with him.  Once Dr. Alvaro was available we reviewed the case and plan and he has agreed to see patient for open nephrectomy.  Initially concerned that he may need to transfer to a tertiary center but thankfully that does not appear to be necessary.  This will be planned on an outpatient basis.  Nephrology following.  Proceed with dialysis as necessary.  He will most likely require this in the recovery phase of his surgery regardless.  Trend labs.  Serum creatinine 15.64.  Case and plan discussed with Dr. Watt  Past Medical History: Past Medical History:  Diagnosis Date   Arrhythmia 1991    patient  describes that he woke up surrounded by doctors saying that his heart had stopped while sleeping; denies having pain during this event; also denies any reccurrent issues after that episode; says that was when they told me i had high blood pressure    Arthritis    Chronic kidney disease    stage IV managed by Dr Arthurine [facility]    Headache    Hypertension    Umbilical hernia     Past Surgical History:  Past Surgical History:  Procedure Laterality Date   HERNIA REPAIR     inguinal    INSERTION OF MESH N/A 01/21/2017   Procedure: INSERTION OF MESH;  Surgeon: Signe Mitzie LABOR, MD;  Location: [facility];  Service: General;  Laterality: N/A;   JOINT REPLACEMENT     KNEE ARTHROSCOPY     TOTAL HIP ARTHROPLASTY Left 03/19/2017   Procedure: LEFT TOTAL HIP ARTHROPLASTY ANTERIOR APPROACH;  Surgeon: Fidel Rogue, MD;  Location: [facility];  Service: Orthopedics;  Laterality: Left;  Needs RNFA   TOTAL KNEE ARTHROPLASTY  06/04/2012   Procedure: TOTAL KNEE ARTHROPLASTY;  Surgeon: Lamar Collet, MD;  Location: [facility];  Service: Orthopedics;  Laterality: Left;   TOTAL KNEE ARTHROPLASTY Right 10/14/2019   Procedure: TOTAL KNEE ARTHROPLASTY;  Surgeon: Collet Lamar, MD;  Location: [facility];  Service: Orthopedics;  Laterality: Right;  adductor canal   UMBILICAL HERNIA REPAIR N/A 01/21/2017   Procedure: UMBILICAL HERNIA REPAIR WITH MESH;  Surgeon: Signe Mitzie LABOR, MD;  Location: [facility];  Service: General;  Laterality: N/A;    Medication: Current Facility-Administered Medications  Medication Dose Route Frequency Provider Last Rate Last Admin   acetaminophen  (TYLENOL ) tablet 650 mg  650 mg Oral Q6H PRN Kc, Ramesh, MD       Or   acetaminophen  (TYLENOL ) suppository 650 mg  650 mg Rectal Q6H PRN Kc, Ramesh, MD       carvedilol  (COREG ) tablet 25 mg  25 mg Oral BID WC Plunkett, Whitney, MD   25 mg at 05/06/24 0848   cloNIDine  (CATAPRES ) tablet 0.3 mg  0.3 mg Oral TID Doretha Folks, MD   0.3 mg  at 05/06/24 0847   heparin  injection 5,000 Units  5,000 Units Subcutaneous Q8H Kc, Mennie, MD   5,000 Units at 05/06/24 0535   hydrALAZINE  (APRESOLINE ) tablet 100 mg  100 mg Oral TID Doretha Folks, MD   100 mg at 05/06/24 0848   isosorbide  mononitrate (IMDUR ) 24 hr tablet 30 mg  30 mg Oral Daily Plunkett, Folks, MD   30 mg at 05/06/24 0847   ondansetron  (ZOFRAN ) tablet 4 mg  4 mg Oral Q6H PRN Kc, Mennie, MD       Or   ondansetron  (ZOFRAN ) injection 4 mg  4 mg Intravenous Q6H PRN Kc, Ramesh, MD       senna-docusate (Senokot-S) tablet 1 tablet  1 tablet Oral QHS PRN Christobal Mennie, MD        Allergies: Allergies  Allergen Reactions   Hydrocodone Other (See Comments)    Severe hiccups    Social History: Social History   Tobacco Use   Smoking status: Never   Smokeless tobacco: Never  Vaping Use   Vaping status: Never Used  Substance Use Topics   Alcohol use: No   Drug use: No    Family History Family History  Problem Relation Age of Onset   Diabetes Mother    Heart disease Father    Asthma Son    Diabetes Maternal Grandmother     Review of Systems  Gastrointestinal:  Positive for abdominal pain.  Genitourinary:  Negative for dysuria, frequency, hematuria and urgency.     Objective   Vital signs in last 24 hours: BP 135/71 (BP Location: Right Arm)   Pulse 66   Temp 98 F (36.7 C) (Oral)   Resp 20   Ht 5' 7 (1.702 m)   Wt 77.6 kg   SpO2 90%   BMI 26.78 kg/m   Physical Exam General: A&O, resting, appropriate HEENT: [place]/AT Pulmonary: Normal work of breathing Cardiovascular: no cyanosis Abdomen: Soft, NTTP, nondistended   Most Recent Labs: Lab Results  Component Value Date   WBC 7.0 05/06/2024   HGB 8.6 (L) 05/06/2024   HCT 26.4 (L) 05/06/2024   PLT 275 05/06/2024    Lab Results  Component Value Date   NA 137 05/06/2024   K 3.9 05/06/2024   CL 102 05/06/2024   CO2 16 (L) 05/06/2024   BUN 157 (H) 05/06/2024   CREATININE 15.64 (H) 05/06/2024    CALCIUM  9.1 05/06/2024    Lab Results  Component Value Date   INR 1.2 05/06/2024   APTT 23 (L) 10/14/2019     Urine Culture: @[id number] (laburin,org,[id number])@   IMAGING: CT CHEST WO CONTRAST Result Date: 05/05/2024 EXAM: CT CHEST WITHOUT CONTRAST 05/05/2024 04:00:00 PM TECHNIQUE: CT of the chest was performed without the administration of intravenous contrast. Multiplanar reformatted images are provided for review. Automated exposure control, iterative reconstruction, and/or weight based adjustment  of the mA/kV was utilized to reduce the radiation dose to as low as reasonably achievable. COMPARISON: None available. CLINICAL HISTORY: renal mass, staging. FINDINGS: MEDIASTINUM: \Moderate coronary atherosclerosis of the LAD and right coronary artery. Pericardium is unremarkable. The central airways are clear. Mild thoracic atherosclerosis. LYMPH NODES: No mediastinal, hilar or axillary lymphadenopathy. LUNGS AND PLEURA: 2 left lower lobe nodules measuring 2-3 mm (images 97 and 110), technically indeterminate but likely benign. Trace bilateral pleural effusions. No focal consolidation or pulmonary edema. No pneumothorax. SOFT TISSUES/BONES: Moderate changes of the thoracic spine with mild dextroscoliosis. No acute abnormality of the soft tissues. UPPER ABDOMEN: Visualized upper abdomen is unchanged from recent CT abdomen and pelvis noting a large right renal mass. IMPRESSION: 1. Two 23 mm left lower lobe pulmonary nodules, technically indeterminate but likely benign. In the setting of known malignancy, consider follow-up CT chest in 3-6 months. 2. Trace bilateral pleural effusions. 3. Large right renal mass, unchanged from recent CT abdomen and pelvis. Electronically signed by: Pinkie Pebbles MD 05/05/2024 07:24 PM EDT RP Workstation: [id number]   CT ABDOMEN PELVIS WO CONTRAST Result Date: 05/04/2024 EXAM: CT ABDOMEN AND PELVIS WITHOUT CONTRAST 05/04/2024 07:16:37 PM TECHNIQUE: CT of the abdomen  and pelvis was performed without the administration of intravenous contrast. Multiplanar reformatted images are provided for review. Automated exposure control, iterative reconstruction, and/or weight-based adjustment of the mA/kV was utilized to reduce the radiation dose to as low as reasonably achievable. COMPARISON: None available. CLINICAL HISTORY: Abdominal mass, palpable. Patient reports a hardened mass on the right side of the abdomen, denies pain, first noticed Sunday. Normal bowel movements, last bowel movement this morning. FINDINGS: LOWER CHEST: No acute abnormality. LIVER: The liver is unremarkable. GALLBLADDER AND BILE DUCTS: Gallbladder is unremarkable. No biliary ductal dilatation. SPLEEN: No acute abnormality. PANCREAS: No acute abnormality. ADRENAL GLANDS: The adrenal glands are unremarkable. KIDNEYS, URETERS AND BLADDER: A 15.4 x 19.1 x 16.7 cm mass is seen involving the interpolar region of the right kidney, likely representing a primary renal cell carcinoma. Variable density is likely related to areas of central necrosis. Faint Dystrophic calcification is seen throughout the mass. The mass demonstrates endophytic and exophytic extension with mass effect upon the adjacent gallbladder and ascending colon without evidence of direct invasion, as well as extension into the right renal pelvis without definite involvement of the right renal vein or proximal right ureter. No hydronephrosis of the right kidney is seen. The mass appears to extend beyond Gerota fascia laterally, where it abuts the peritoneal lining, with significant mass effect upon the inferior vena cava, which is slit-like in configuration. Multiple indeterminate exophytic cortical lesions are seen arising from the left kidney, measuring up to 15 mm, which are not well characterized on this noncontrast examination. No hydronephrosis on the left. No intrarenal or ureteral calculi. The bladder is partially obscured by artifact but is  otherwise unremarkable. GI AND BOWEL: Appendix is normal. Stomach, small bowel, and large bowel are otherwise unremarkable. PERITONEUM AND RETROPERITONEUM: No ascites. No free air. VASCULATURE: The inferior vena cava is slit-like in configuration, best seen at image number 38, series number 2, without direct invasion of the structure. LYMPH NODES: No adjacent pathologic adenopathy is identified. REPRODUCTIVE ORGANS: No acute abnormality. BONES AND SOFT TISSUES: Left total hip arthroplasty has been performed. Osseous structures are otherwise age-appropriate. No lytic or blastic bone lesions. IMPRESSION: 1. Large right renal mass (15.4 x 19.1 x 16.7 cm) with variable density, likely representing primary renal cell carcinoma with central necrosis.  The mass demonstrates endophytic and exophytic extension, mass effect on the gallbladder and ascending colon without direct invasion, and extension into the right renal pelvis without definite involvement of the right renal vein or proximal right ureter. The mass also extends beyond Gerota fascia laterally, abutting the peritoneal lining, and causes significant mass effect on the inferior vena cava without direct invasion. No adjacent pathologic adenopathy or hydronephrosis. 2. Multiple indeterminate exophytic cortical lesions in the left kidney, measuring up to 15 mm, not well characterized on this noncontrast examination. Dedicated contrast-enhanced MRI examination is recommended for further evaluation. 3. Non-emergent CT examination of the chest is recommended for staging purposes. Electronically signed by: Dorethia Molt MD 05/04/2024 07:43 PM EDT RP Workstation: [id number]   ------  Ole Bourdon, NP Pager: [phone]   Please contact the urology consult pager with any further questions/concerns.

## 2024-05-06 NOTE — TOC Initial Note (Signed)
 Transition of Care [facility]) - Initial/Assessment Note    Patient Details  Name: Marvin Garcia MRN: [id number] Date of Birth: Nov 19, 1955  [facility] of [facility]) CM/SW Contact:    Corean JAYSON Canary, RN Phone Number: 05/06/2024, 9:53 AM  Clinical Narrative:                 68 year old with CKD V being seen by Nephrology and infusion center for anemia. Presented with a palpable renal mass.  He lives with wife at home, he will need a PCP, internal medicine   IP  care management will follow the patient for needs, recommendations, and transitions of care  Expected Discharge Plan: Home/Self Care Barriers to Discharge: Continued Medical Work up   Patient Goals and CMS Choice            Expected Discharge Plan and Services   Discharge Planning Services: CM Consult   Living arrangements for the past 2 months: Single Family Home                                      Prior Living Arrangements/Services Living arrangements for the past 2 months: Single Family Home Lives with:: Spouse Patient language and need for interpreter reviewed:: Yes        Need for Family Participation in Patient Care: Yes (Comment) Care giver support system in place?: Yes (comment)   Criminal Activity/Legal Involvement Pertinent to Current Situation/Hospitalization: No - Comment as needed  Activities of Daily Living   ADL Screening (condition at time of admission) Independently performs ADLs?: No Does the patient have a NEW difficulty with bathing/dressing/toileting/self-feeding that is expected to last >3 days?: No Does the patient have a NEW difficulty with getting in/out of bed, walking, or climbing stairs that is expected to last >3 days?: No Does the patient have a NEW difficulty with communication that is expected to last >3 days?: No Is the patient deaf or have difficulty hearing?: No Does the patient have difficulty seeing, even when wearing glasses/contacts?: No Does the patient have  difficulty concentrating, remembering, or making decisions?: No  Permission Sought/Granted                  Emotional Assessment       Orientation: : Oriented to Self, Oriented to Place Alcohol / Substance Use: Not Applicable Psych Involvement: No (comment)  Admission diagnosis:  ARF (acute renal failure) [N17.9] Renal mass [N28.89] Acute kidney injury [N17.9] Stage 5 chronic kidney disease (HCC) [N18.5] Patient Active Problem List   Diagnosis Date Noted   ARF (acute renal failure) 05/04/2024   Anemia due to stage 5 chronic kidney disease treated with erythropoietin (HCC) 04/15/2024   Osteoarthritis of right knee 10/14/2019   Pain in right knee 09/13/2019   History of total hip arthroplasty 04/08/2018   Hypertensive disorder 09/30/2017   Kidney disease 09/30/2017   Pain of left hip joint 08/13/2017   Osteoarthritis of left hip 03/19/2017   PCP:  Patient, No Pcp Per Pharmacy:   [id number] [place], [place] - [address] [place] [address] Phone: [phone] Fax: [phone]     Social Drivers of Health (SDOH) Social History: SDOH Screenings   Food Insecurity: No Food Insecurity (05/05/2024)  Housing: Low Risk  (05/05/2024)  Transportation Needs: No Transportation Needs (05/05/2024)  Utilities: Not At Risk (05/05/2024)  Social Connections: Moderately Isolated (05/05/2024)  Tobacco Use:  Low Risk  (03/21/2024)   SDOH Interventions:     Readmission Risk Interventions     No data to display

## 2024-05-06 NOTE — Progress Notes (Signed)
 PROGRESS NOTE                                                                                                                                                                                                             Patient Demographics:    Marvin Garcia, is a 68 y.o. male, DOB - 05-Sep-1955, FMW:[id number]  Outpatient Primary MD for the patient is Patient, No Pcp Per    LOS - 1  Admit date - 05/04/2024    Chief Complaint  Patient presents with   Mass       Brief Narrative (HPI from H&P)    68 y.o. male with PMH of chronic kidney disease stage V, hypertension presented with abdominal discomfort and fullness to [facility]. He satarted noticing bulge on left abdomen since Sunday. Patient otherwise denies any nausea, vomiting, chest pain, shortness of breath, fever, chills, headache, focal weakness, numbness tingling, speech difficulties. He has not had recetn weight loss, or night sweats or chills.  The workup in the ER suggested a large right sided renal mass suspicious for RCC with possible mets to the lungs.   Subjective:    Marvin Garcia today has, No headache, No chest pain, No abdominal pain - No Nausea, No new weakness tingling or numbness, no shortness of breath, no nausea, good appetite.   Assessment  & Plan :   Progressive CKD 5 vs AKI on CKD, Metabolic acidosis: Nephrology aware, currently no signs of fluid overload.  Add sodium bicarb p.o. monitor renal function, avoid nephrotoxic medication and renally dose meds.  Management per nephrology.  Continue to monitor.  Currently not overtly uremic.     Large right-sided renal mass likely RCC: Urology has been consulted on 05/05/2024-Dr. Gretel Ferrara and NP Cam Sattenfield-chest CT also suspicious for possible metastatic lesion, defer management to urology.   Anemia of renal disease: Monitor hemoglobin transfuse less than 7 g.   Hypertension: BP stable continue  Coreg  clonidine  hydralazine  Imdur .  Holding amlodipine , ARB, continue to monitor.      Condition -   Guarded  Family Communication  : Wife [name]  updated in detail on 05/06/2024  Code Status :  Full  Consults  :  Renal, Urology  PUD Prophylaxis :    Procedures  :     CT Chest - 1. Two 23 mm left  lower lobe pulmonary nodules, technically indeterminate but likely benign. In the setting of known malignancy, consider follow-up CT chest in 3-6 months. 2. Trace bilateral pleural effusions. 3. Large right renal mass, unchanged from recent CT abdomen and pelvis.  CT Abd pelvis -   1. Large right renal mass (15.4 x 19.1 x 16.7 cm) with variable density, likely representing primary renal cell carcinoma with central necrosis. The mass demonstrates endophytic and exophytic extension, mass effect on the gallbladder and ascending colon without direct invasion, and extension into the right renal pelvis without definite involvement of the right renal vein or proximal right ureter. The mass also extends beyond Gerota fascia laterally, abutting the peritoneal lining, and causes significant mass effect on the inferior vena cava without direct invasion. No adjacent pathologic adenopathy or hydronephrosis. 2. Multiple indeterminate exophytic cortical lesions in the left kidney, measuring up to 15 mm, not well characterized on this noncontrast examination. Dedicated contrast-enhanced MRI examination is recommended for further evaluation. 3. Non-emergent CT examination of the chest is recommended for staging purposes      Disposition Plan  :    Status is: Inpatient   DVT Prophylaxis  :    heparin  injection 5,000 Units Start: 05/05/24 1615 SCDs Start: 05/05/24 1524    Lab Results  Component Value Date   PLT 275 05/06/2024    Diet :  Diet Order             Diet renal with fluid restriction Fluid restriction: 1500 mL Fluid; Room service appropriate? Yes; Fluid consistency: Thin  Diet effective  now                    Inpatient Medications  Scheduled Meds:  carvedilol   25 mg Oral BID WC   cloNIDine   0.3 mg Oral TID   heparin   5,000 Units Subcutaneous Q8H   hydrALAZINE   100 mg Oral TID   isosorbide  mononitrate  30 mg Oral Daily   Continuous Infusions: PRN Meds:.acetaminophen  **OR** acetaminophen , ondansetron  **OR** ondansetron  (ZOFRAN ) IV, senna-docusate  Antibiotics  :    Anti-infectives (From admission, onward)    None         Objective:   Vitals:   05/05/24 2003 05/06/24 0000 05/06/24 0400 05/06/24 0800  BP: 135/82 131/76 (!) 144/73 (!) 147/83  Pulse: 69 64 60 67  Resp: 19 19 20 15   Temp: 98.7 F (37.1 C) 98.5 F (36.9 C) 98.6 F (37 C) 98.2 F (36.8 C)  TempSrc: Oral Oral Oral Oral  SpO2: 93% 92% 93% 95%  Weight:      Height:        Wt Readings from Last 3 Encounters:  05/04/24 77.6 kg  04/07/23 87.1 kg  03/24/23 87.1 kg     Intake/Output Summary (Last 24 hours) at 05/06/2024 1018 Last data filed at 05/06/2024 0530 Gross per 24 hour  Intake --  Output 1400 ml  Net -1400 ml     Physical Exam  Awake Alert, No new F.N deficits, Normal affect Stamps.AT,PERRAL Supple Neck, No JVD,   Symmetrical Chest wall movement, Good air movement bilaterally, CTAB RRR,No Gallops,Rubs or new Murmurs,  +ve B.Sounds, Abd Soft, No tenderness,   No Cyanosis, Clubbing or edema        Data Review:    Recent Labs  Lab 05/03/24 1039 05/04/24 1552 05/06/24 0258  WBC  --  6.0 7.0  HGB 9.0* 8.1* 8.6*  HCT  --  25.2* 26.4*  PLT  --  260  275  MCV  --  75.2* 74.4*  MCH  --  24.2* 24.2*  MCHC  --  32.1 32.6  RDW  --  18.4* 18.8*  LYMPHSABS  --  0.8  --   MONOABS  --  0.7  --   EOSABS  --  0.1  --   BASOSABS  --  0.0  --     Recent Labs  Lab 05/04/24 1552 05/06/24 0258  NA 139 137  K 4.4 3.9  CL 99 102  CO2 16* 16*  ANIONGAP 24* 19*  GLUCOSE 109* 83  BUN 149* 157*  CREATININE 14.20* 15.64*  AST 15  --   ALT 6  --   ALKPHOS 84  --    BILITOT 0.5  --   ALBUMIN 4.4  --   INR  --  1.2  CALCIUM  9.7 9.1      Recent Labs  Lab 05/04/24 1552 05/06/24 0258  INR  --  1.2  CALCIUM  9.7 9.1    --------------------------------------------------------------------------------------------------------------- No results found for: CHOL, HDL, LDLCALC, LDLDIRECT, TRIG, CHOLHDL  No results found for: HGBA1C No results for input(s): TSH, T4TOTAL, FREET4, T3FREE, THYROIDAB in the last 72 hours. No results for input(s): VITAMINB12, FOLATE, FERRITIN, TIBC, IRON, RETICCTPCT in the last 72 hours. ------------------------------------------------------------------------------------------------------------------ Cardiac Enzymes No results for input(s): CKMB, TROPONINI, MYOGLOBIN in the last 168 hours.  Invalid input(s): CK  Micro Results No results found for this or any previous visit (from the past 240 hours).  Radiology Report CT CHEST WO CONTRAST Result Date: 05/05/2024 EXAM: CT CHEST WITHOUT CONTRAST 05/05/2024 04:00:00 PM TECHNIQUE: CT of the chest was performed without the administration of intravenous contrast. Multiplanar reformatted images are provided for review. Automated exposure control, iterative reconstruction, and/or weight based adjustment of the mA/kV was utilized to reduce the radiation dose to as low as reasonably achievable. COMPARISON: None available. CLINICAL HISTORY: renal mass, staging. FINDINGS: MEDIASTINUM: \Moderate coronary atherosclerosis of the LAD and right coronary artery. Pericardium is unremarkable. The central airways are clear. Mild thoracic atherosclerosis. LYMPH NODES: No mediastinal, hilar or axillary lymphadenopathy. LUNGS AND PLEURA: 2 left lower lobe nodules measuring 2-3 mm (images 97 and 110), technically indeterminate but likely benign. Trace bilateral pleural effusions. No focal consolidation or pulmonary edema. No pneumothorax. SOFT TISSUES/BONES:  Moderate changes of the thoracic spine with mild dextroscoliosis. No acute abnormality of the soft tissues. UPPER ABDOMEN: Visualized upper abdomen is unchanged from recent CT abdomen and pelvis noting a large right renal mass. IMPRESSION: 1. Two 23 mm left lower lobe pulmonary nodules, technically indeterminate but likely benign. In the setting of known malignancy, consider follow-up CT chest in 3-6 months. 2. Trace bilateral pleural effusions. 3. Large right renal mass, unchanged from recent CT abdomen and pelvis. Electronically signed by: Pinkie Pebbles MD 05/05/2024 07:24 PM EDT RP Workstation: [id number]   CT ABDOMEN PELVIS WO CONTRAST Result Date: 05/04/2024 EXAM: CT ABDOMEN AND PELVIS WITHOUT CONTRAST 05/04/2024 07:16:37 PM TECHNIQUE: CT of the abdomen and pelvis was performed without the administration of intravenous contrast. Multiplanar reformatted images are provided for review. Automated exposure control, iterative reconstruction, and/or weight-based adjustment of the mA/kV was utilized to reduce the radiation dose to as low as reasonably achievable. COMPARISON: None available. CLINICAL HISTORY: Abdominal mass, palpable. Patient reports a hardened mass on the right side of the abdomen, denies pain, first noticed Sunday. Normal bowel movements, last bowel movement this morning. FINDINGS: LOWER CHEST: No acute abnormality. LIVER: The liver is unremarkable. GALLBLADDER AND BILE  DUCTS: Gallbladder is unremarkable. No biliary ductal dilatation. SPLEEN: No acute abnormality. PANCREAS: No acute abnormality. ADRENAL GLANDS: The adrenal glands are unremarkable. KIDNEYS, URETERS AND BLADDER: A 15.4 x 19.1 x 16.7 cm mass is seen involving the interpolar region of the right kidney, likely representing a primary renal cell carcinoma. Variable density is likely related to areas of central necrosis. Faint Dystrophic calcification is seen throughout the mass. The mass demonstrates endophytic and exophytic  extension with mass effect upon the adjacent gallbladder and ascending colon without evidence of direct invasion, as well as extension into the right renal pelvis without definite involvement of the right renal vein or proximal right ureter. No hydronephrosis of the right kidney is seen. The mass appears to extend beyond Gerota fascia laterally, where it abuts the peritoneal lining, with significant mass effect upon the inferior vena cava, which is slit-like in configuration. Multiple indeterminate exophytic cortical lesions are seen arising from the left kidney, measuring up to 15 mm, which are not well characterized on this noncontrast examination. No hydronephrosis on the left. No intrarenal or ureteral calculi. The bladder is partially obscured by artifact but is otherwise unremarkable. GI AND BOWEL: Appendix is normal. Stomach, small bowel, and large bowel are otherwise unremarkable. PERITONEUM AND RETROPERITONEUM: No ascites. No free air. VASCULATURE: The inferior vena cava is slit-like in configuration, best seen at image number 38, series number 2, without direct invasion of the structure. LYMPH NODES: No adjacent pathologic adenopathy is identified. REPRODUCTIVE ORGANS: No acute abnormality. BONES AND SOFT TISSUES: Left total hip arthroplasty has been performed. Osseous structures are otherwise age-appropriate. No lytic or blastic bone lesions. IMPRESSION: 1. Large right renal mass (15.4 x 19.1 x 16.7 cm) with variable density, likely representing primary renal cell carcinoma with central necrosis. The mass demonstrates endophytic and exophytic extension, mass effect on the gallbladder and ascending colon without direct invasion, and extension into the right renal pelvis without definite involvement of the right renal vein or proximal right ureter. The mass also extends beyond Gerota fascia laterally, abutting the peritoneal lining, and causes significant mass effect on the inferior vena cava without direct  invasion. No adjacent pathologic adenopathy or hydronephrosis. 2. Multiple indeterminate exophytic cortical lesions in the left kidney, measuring up to 15 mm, not well characterized on this noncontrast examination. Dedicated contrast-enhanced MRI examination is recommended for further evaluation. 3. Non-emergent CT examination of the chest is recommended for staging purposes. Electronically signed by: Dorethia Molt MD 05/04/2024 07:43 PM EDT RP Workstation: [id number]     Signature  -   Lavada Stank M.D on 05/06/2024 at 10:18 AM   -  To page go to [email or web address]

## 2024-05-07 ENCOUNTER — Inpatient Hospital Stay (HOSPITAL_COMMUNITY)

## 2024-05-07 ENCOUNTER — Encounter (HOSPITAL_COMMUNITY): Payer: Self-pay | Admitting: Internal Medicine

## 2024-05-07 DIAGNOSIS — N179 Acute kidney failure, unspecified: Secondary | ICD-10-CM | POA: Diagnosis not present

## 2024-05-07 DIAGNOSIS — C641 Malignant neoplasm of right kidney, except renal pelvis: Secondary | ICD-10-CM | POA: Diagnosis not present

## 2024-05-07 DIAGNOSIS — I472 Ventricular tachycardia, unspecified: Secondary | ICD-10-CM | POA: Diagnosis not present

## 2024-05-07 DIAGNOSIS — E872 Acidosis, unspecified: Secondary | ICD-10-CM | POA: Diagnosis not present

## 2024-05-07 DIAGNOSIS — I120 Hypertensive chronic kidney disease with stage 5 chronic kidney disease or end stage renal disease: Secondary | ICD-10-CM | POA: Diagnosis not present

## 2024-05-07 HISTORY — PX: IR TUNNELED CENTRAL VENOUS CATH PLC W IMG: IMG1939

## 2024-05-07 HISTORY — PX: IR FLUORO GUIDE CV LINE RIGHT: IMG2283

## 2024-05-07 HISTORY — PX: IR US GUIDE VASC ACCESS RIGHT: IMG2390

## 2024-05-07 LAB — RENAL FUNCTION PANEL
Albumin: 3.6 g/dL (ref 3.5–5.0)
Anion gap: 19 — ABNORMAL HIGH (ref 5–15)
BUN: 158 mg/dL — ABNORMAL HIGH (ref 8–23)
CO2: 16 mmol/L — ABNORMAL LOW (ref 22–32)
Calcium: 9.5 mg/dL (ref 8.9–10.3)
Chloride: 103 mmol/L (ref 98–111)
Creatinine, Ser: 16.76 mg/dL — ABNORMAL HIGH (ref 0.61–1.24)
GFR, Estimated: 3 mL/min — ABNORMAL LOW (ref >60)
Glucose, Bld: 122 mg/dL — ABNORMAL HIGH (ref 70–99)
Phosphorus: 7.4 mg/dL — ABNORMAL HIGH (ref 2.5–4.6)
Potassium: 3.8 mmol/L (ref 3.5–5.1)
Sodium: 138 mmol/L (ref 135–145)

## 2024-05-07 LAB — HEPATITIS B SURFACE ANTIGEN: Hepatitis B Surface Ag: NONREACTIVE

## 2024-05-07 MED ORDER — LIDOCAINE-EPINEPHRINE 1 %-1:100000 IJ SOLN: 10.0000 mL | Freq: Once | INTRAMUSCULAR | Status: DC

## 2024-05-07 MED ORDER — FENTANYL CITRATE (PF) 100 MCG/2ML IJ SOLN
INTRAMUSCULAR | Status: AC
  Filled 2024-05-07: qty 2

## 2024-05-07 MED ORDER — CHLORHEXIDINE GLUCONATE CLOTH 2 % EX PADS
6.0000 | MEDICATED_PAD | Freq: Every day | CUTANEOUS | Status: DC
Start: 2024-05-08 — End: 2024-05-11
  Administered 2024-05-08 – 2024-05-11 (×4): 6 via TOPICAL

## 2024-05-07 MED ORDER — ALTEPLASE 2 MG IJ SOLR: 2.0000 mg | Freq: Once | INTRAMUSCULAR | Status: DC | PRN

## 2024-05-07 MED ORDER — HEPARIN SODIUM (PORCINE) 1000 UNIT/ML IJ SOLN
INTRAMUSCULAR | Status: AC
Start: 2024-05-07 — End: 2024-05-07
  Filled 2024-05-07: qty 10

## 2024-05-07 MED ORDER — HEPARIN SODIUM (PORCINE) 1000 UNIT/ML DIALYSIS: 1000.0000 [IU] | INTRAMUSCULAR | Status: DC | PRN

## 2024-05-07 MED ORDER — HEPARIN SODIUM (PORCINE) 1000 UNIT/ML IJ SOLN
INTRAMUSCULAR | Status: AC
Start: 2024-05-07 — End: 2024-05-07
  Filled 2024-05-07: qty 4

## 2024-05-07 MED ORDER — FENTANYL CITRATE (PF) 100 MCG/2ML IJ SOLN
INTRAMUSCULAR | Status: AC | PRN
  Administered 2024-05-07 (×2): 25 ug via INTRAVENOUS
  Administered 2024-05-07: 50 ug via INTRAVENOUS

## 2024-05-07 MED ORDER — LIDOCAINE-EPINEPHRINE 1 %-1:100000 IJ SOLN
20.0000 mL | Freq: Once | INTRAMUSCULAR | Status: AC
  Administered 2024-05-07: 20 mL via INTRADERMAL

## 2024-05-07 MED ORDER — LIDOCAINE-EPINEPHRINE 1 %-1:100000 IJ SOLN
INTRAMUSCULAR | Status: AC
  Filled 2024-05-07: qty 1

## 2024-05-07 MED ORDER — ACETAMINOPHEN 325 MG PO TABS
ORAL_TABLET | ORAL | Status: AC
  Filled 2024-05-07: qty 2

## 2024-05-07 NOTE — Progress Notes (Signed)
 PROGRESS NOTE                                                                                                                                                                                                             Patient Demographics:    Marvin Garcia, is a 68 y.o. male, DOB - Dec 28, 1955, FMW:[id number]  Outpatient Primary MD for the patient is Patient, No Pcp Per    LOS - 2  Admit date - 05/04/2024    Chief Complaint  Patient presents with   Mass       Brief Narrative (HPI from H&P)    68 y.o. male with PMH of chronic kidney disease stage V, hypertension presented with abdominal discomfort and fullness to [facility]. He satarted noticing bulge on left abdomen since Sunday. Patient otherwise denies any nausea, vomiting, chest pain, shortness of breath, fever, chills, headache, focal weakness, numbness tingling, speech difficulties. He has not had recetn weight loss, or night sweats or chills.  The workup in the ER suggested a large right sided renal mass suspicious for RCC with possible mets to the lungs.   Subjective:   Patient in bed, appears comfortable, denies any headache, no fever, no chest pain or pressure, no shortness of breath , no abdominal pain. No focal weakness.   Assessment  & Plan :   Progressive CKD 5 vs AKI on CKD, Metabolic acidosis: Nephrology following, HD catheter to be placed on 05/07/2024 and we will commence HD    Large right-sided renal mass likely RCC: Urology has been consulted and is outpatient resection once patient is discharged and dialysis has been initiated, will follow-up with Dr. Patrcia.   Anemia of renal disease: Monitor hemoglobin transfuse less than 7 g.   Hypertension: BP stable continue Coreg  clonidine  hydralazine  Imdur .  Holding amlodipine , ARB, continue to monitor.  Lung nodules.  Repeat CT in 3 months follow-up with pulmonary postdischarge.      Condition -    Guarded  Family Communication  : Wife [name]  updated in detail on 05/06/2024  Code Status :  Full  Consults  :  Renal, Urology  PUD Prophylaxis :    Procedures  :     CT Chest - 1. Two 23 mm left lower lobe pulmonary nodules, technically indeterminate but likely benign. In the setting of known malignancy,  consider follow-up CT chest in 3-6 months. 2. Trace bilateral pleural effusions. 3. Large right renal mass, unchanged from recent CT abdomen and pelvis.  CT Abd pelvis -   1. Large right renal mass (15.4 x 19.1 x 16.7 cm) with variable density, likely representing primary renal cell carcinoma with central necrosis. The mass demonstrates endophytic and exophytic extension, mass effect on the gallbladder and ascending colon without direct invasion, and extension into the right renal pelvis without definite involvement of the right renal vein or proximal right ureter. The mass also extends beyond Gerota fascia laterally, abutting the peritoneal lining, and causes significant mass effect on the inferior vena cava without direct invasion. No adjacent pathologic adenopathy or hydronephrosis. 2. Multiple indeterminate exophytic cortical lesions in the left kidney, measuring up to 15 mm, not well characterized on this noncontrast examination. Dedicated contrast-enhanced MRI examination is recommended for further evaluation. 3. Non-emergent CT examination of the chest is recommended for staging purposes      Disposition Plan  :    Status is: Inpatient   DVT Prophylaxis  :    heparin  injection 5,000 Units Start: 05/05/24 1615 SCDs Start: 05/05/24 1524    Lab Results  Component Value Date   PLT 275 05/06/2024    Diet :  Diet Order             Diet NPO time specified  Diet effective now                    Inpatient Medications  Scheduled Meds:  carvedilol   12.5 mg Oral BID WC   cloNIDine   0.2 mg Oral TID   heparin   5,000 Units Subcutaneous Q8H   hydrALAZINE   100 mg  Oral TID   isosorbide  mononitrate  60 mg Oral Daily   Continuous Infusions: PRN Meds:.acetaminophen  **OR** acetaminophen , hydrALAZINE , ondansetron  **OR** ondansetron  (ZOFRAN ) IV, senna-docusate  Antibiotics  :    Anti-infectives (From admission, onward)    None         Objective:   Vitals:   05/07/24 0000 05/07/24 0200 05/07/24 0400 05/07/24 0800  BP: (!) 156/87  (!) 158/84 (!) 158/88  Pulse:   68 68  Resp: 15 17 17 13   Temp: 98.6 F (37 C)  98.4 F (36.9 C) 98.5 F (36.9 C)  TempSrc: Oral  Oral Oral  SpO2: 96% 96% 98% 93%  Weight:      Height:        Wt Readings from Last 3 Encounters:  05/04/24 77.6 kg  04/07/23 87.1 kg  03/24/23 87.1 kg     Intake/Output Summary (Last 24 hours) at 05/07/2024 1002 Last data filed at 05/07/2024 0000 Gross per 24 hour  Intake --  Output 575 ml  Net -575 ml     Physical Exam  Awake Alert, No new F.N deficits, Normal affect Duck.AT,PERRAL Supple Neck, No JVD,   Symmetrical Chest wall movement, Good air movement bilaterally, CTAB RRR,No Gallops,Rubs or new Murmurs,  +ve B.Sounds, Abd Soft, No tenderness,   No Cyanosis, Clubbing or edema        Data Review:    Recent Labs  Lab 05/03/24 1039 05/04/24 1552 05/06/24 0258  WBC  --  6.0 7.0  HGB 9.0* 8.1* 8.6*  HCT  --  25.2* 26.4*  PLT  --  260 275  MCV  --  75.2* 74.4*  MCH  --  24.2* 24.2*  MCHC  --  32.1 32.6  RDW  --  18.4* 18.8*  LYMPHSABS  --  0.8  --   MONOABS  --  0.7  --   EOSABS  --  0.1  --   BASOSABS  --  0.0  --     Recent Labs  Lab 05/04/24 1552 05/06/24 0258  NA 139 137  K 4.4 3.9  CL 99 102  CO2 16* 16*  ANIONGAP 24* 19*  GLUCOSE 109* 83  BUN 149* 157*  CREATININE 14.20* 15.64*  AST 15  --   ALT 6  --   ALKPHOS 84  --   BILITOT 0.5  --   ALBUMIN 4.4  --   INR  --  1.2  CALCIUM  9.7 9.1      Recent Labs  Lab 05/04/24 1552 05/06/24 0258  INR  --  1.2  CALCIUM  9.7 9.1     --------------------------------------------------------------------------------------------------------------- No results found for: CHOL, HDL, LDLCALC, LDLDIRECT, TRIG, CHOLHDL  No results found for: HGBA1C No results for input(s): TSH, T4TOTAL, FREET4, T3FREE, THYROIDAB in the last 72 hours. No results for input(s): VITAMINB12, FOLATE, FERRITIN, TIBC, IRON, RETICCTPCT in the last 72 hours. ------------------------------------------------------------------------------------------------------------------ Cardiac Enzymes No results for input(s): CKMB, TROPONINI, MYOGLOBIN in the last 168 hours.  Invalid input(s): CK  Micro Results No results found for this or any previous visit (from the past 240 hours).  Radiology Report CT CHEST WO CONTRAST Result Date: 05/05/2024 EXAM: CT CHEST WITHOUT CONTRAST 05/05/2024 04:00:00 PM TECHNIQUE: CT of the chest was performed without the administration of intravenous contrast. Multiplanar reformatted images are provided for review. Automated exposure control, iterative reconstruction, and/or weight based adjustment of the mA/kV was utilized to reduce the radiation dose to as low as reasonably achievable. COMPARISON: None available. CLINICAL HISTORY: renal mass, staging. FINDINGS: MEDIASTINUM: \Moderate coronary atherosclerosis of the LAD and right coronary artery. Pericardium is unremarkable. The central airways are clear. Mild thoracic atherosclerosis. LYMPH NODES: No mediastinal, hilar or axillary lymphadenopathy. LUNGS AND PLEURA: 2 left lower lobe nodules measuring 2-3 mm (images 97 and 110), technically indeterminate but likely benign. Trace bilateral pleural effusions. No focal consolidation or pulmonary edema. No pneumothorax. SOFT TISSUES/BONES: Moderate changes of the thoracic spine with mild dextroscoliosis. No acute abnormality of the soft tissues. UPPER ABDOMEN: Visualized upper abdomen is unchanged from  recent CT abdomen and pelvis noting a large right renal mass. IMPRESSION: 1. Two 23 mm left lower lobe pulmonary nodules, technically indeterminate but likely benign. In the setting of known malignancy, consider follow-up CT chest in 3-6 months. 2. Trace bilateral pleural effusions. 3. Large right renal mass, unchanged from recent CT abdomen and pelvis. Electronically signed by: Pinkie Pebbles MD 05/05/2024 07:24 PM EDT RP Workstation: [id number]     Signature  -   Lavada Stank M.D on 05/07/2024 at 10:02 AM   -  To page go to [email or web address]

## 2024-05-07 NOTE — Consult Note (Signed)
 Chief Complaint: CKD5, Right renal mass concerning for renal cell carcinoma - IR consulted for tunneled hemodialysis catheter placement  Referring Provider(s): Marlee Bernardino NOVAK, MD   Supervising Physician: Hughes Simmonds  Patient Status: [facility] - In-pt  History of Present Illness: Marvin Garcia is a 68 y.o. male with hx of CKD5 (not previously on dialysis), HTN. Pt presented to [facility] with abdominal pain and fullness with a bulge in the left abdomen, first noticed 5 days prior. Labs were notable for Cr of 14.20 (prev 12 08/2023 and 4.09 10/2019). CT abdomen showed large right sided renal mass 15.4 x 19.1 x 16.7 cm concerning for renal cell carcinoma. Nephrology and urology were consulted, with decision being to begin dialysis and follow up with urology surgeon outpatient to discuss open nephrectomy. Per nephro, patient to be on permanent dialysis either way. IR has now been consulted for tunneled hemodialysis catheter placement.  Case and imaging reviewed with IR attending Dr. Hughes with plan to proceed.  Today pt feeling tired, but no other new complaints.   Patient is Full Code  Past Medical History:  Diagnosis Date   Arrhythmia 1991   patient  describes that he woke up surrounded by doctors saying that his heart had stopped while sleeping; denies having pain during this event; also denies any reccurrent issues after that episode; says that was when they told me i had high blood pressure    Arthritis    Chronic kidney disease    stage IV managed by Dr Arthurine leash kidney    Headache    Hypertension    Umbilical hernia     Past Surgical History:  Procedure Laterality Date   HERNIA REPAIR     inguinal    INSERTION OF MESH N/A 01/21/2017   Procedure: INSERTION OF MESH;  Surgeon: Signe Mitzie LABOR, MD;  Location: [facility];  Service: General;  Laterality: N/A;   JOINT REPLACEMENT     KNEE ARTHROSCOPY     TOTAL HIP ARTHROPLASTY Left 03/19/2017   Procedure: LEFT TOTAL  HIP ARTHROPLASTY ANTERIOR APPROACH;  Surgeon: Fidel Rogue, MD;  Location: [facility];  Service: Orthopedics;  Laterality: Left;  Needs RNFA   TOTAL KNEE ARTHROPLASTY  06/04/2012   Procedure: TOTAL KNEE ARTHROPLASTY;  Surgeon: Lamar Collet, MD;  Location: [facility];  Service: Orthopedics;  Laterality: Left;   TOTAL KNEE ARTHROPLASTY Right 10/14/2019   Procedure: TOTAL KNEE ARTHROPLASTY;  Surgeon: Collet Lamar, MD;  Location: [facility];  Service: Orthopedics;  Laterality: Right;  adductor canal   UMBILICAL HERNIA REPAIR N/A 01/21/2017   Procedure: UMBILICAL HERNIA REPAIR WITH MESH;  Surgeon: Signe Mitzie LABOR, MD;  Location: [facility];  Service: General;  Laterality: N/A;    Allergies: Hydrocodone  Medications: Prior to Admission medications   Medication Sig Start Date End Date Taking? Authorizing Provider  acetaminophen  (TYLENOL ) 650 MG CR tablet Take 650-1,300 mg by mouth every 8 (eight) hours as needed for pain.   Yes [provider]  amLODipine  (NORVASC ) 10 MG tablet Take 10 mg by mouth at bedtime.  10/03/19  Yes [provider]  Ascorbic Acid  (VITAMIN C ) 1000 MG tablet Take 1,000 mg by mouth daily.   Yes [provider]  carvedilol  (COREG ) 25 MG tablet Take 25 mg by mouth 2 (two) times daily with a meal.   Yes [provider]  cloNIDine  (CATAPRES ) 0.3 MG tablet Take 0.3 mg by mouth in the morning, at noon, and at bedtime.   Yes [provider]  Coenzyme Q10 (COQ-10) 100 MG CAPS Take 100 mg by mouth daily.   Yes [provider]  Febuxostat  80 MG TABS Take 80 mg by mouth daily.    Yes [provider]  hydrALAZINE  (APRESOLINE ) 100 MG tablet Take 100 mg by mouth 3 (three) times daily.   Yes [provider]  isosorbide  mononitrate (IMDUR ) 30 MG 24 hr tablet Take 30 mg by mouth daily. 09/08/19  Yes [provider]  Multiple Vitamin (MULTIVITAMIN WITH MINERALS) TABS tablet Take 1 tablet by mouth daily. *alive mens 50+*   Yes  [provider]  Omega-3 Fatty Acids (OMEGA 3 500) 500 MG CAPS Take 500 mg by mouth daily.   Yes [provider]  OVER THE COUNTER MEDICATION Take 1 tablet by mouth daily. **vitra-cq10-100 mg*   Yes [provider]  OVER THE COUNTER MEDICATION Take 1 tablet by mouth daily. **ultimate iron 50 mg**   Yes [provider]  aspirin  81 MG chewable tablet Chew 1 tablet (81 mg total) by mouth 2 (two) times daily. Patient not taking: Reported on 05/05/2024 03/20/17   Fidel Rogue, MD  irbesartan  (AVAPRO ) 150 MG tablet Take 150 mg by mouth daily.    [provider]  methocarbamol  (ROBAXIN ) 500 MG tablet Take 1 tablet (500 mg total) by mouth 4 (four) times daily. Patient not taking: Reported on 05/05/2024 10/14/19   Stephen Olympia SAUNDERS, PA     Family History  Problem Relation Age of Onset   Diabetes Mother    Heart disease Father    Asthma Son    Diabetes Maternal Grandmother     Social History   Socioeconomic History   Marital status: Married    Spouse name: Not on file   Number of children: Not on file   Years of education: Not on file   Highest education level: Not on file  Occupational History   Not on file  Tobacco Use   Smoking status: Never   Smokeless tobacco: Never  Vaping Use   Vaping status: Never Used  Substance and Sexual Activity   Alcohol use: No   Drug use: No   Sexual activity: Not on file  Other Topics Concern   Not on file  Social History Narrative   Not on file   Social Drivers of Health   Financial Resource Strain: Not on file  Food Insecurity: No Food Insecurity (05/05/2024)   Hunger Vital Sign    Worried About Running Out of Food in the Last Year: Never true    Ran Out of Food in the Last Year: Never true  Transportation Needs: No Transportation Needs (05/05/2024)   PRAPARE - Administrator, Civil Service (Medical): No    Lack of Transportation (Non-Medical): No  Physical Activity: Not on file  Stress:  Not on file  Social Connections: Moderately Isolated (05/05/2024)   Social Connection and Isolation Panel    Frequency of Communication with Friends and Family: Twice a week    Frequency of Social Gatherings with Friends and Family: Never    Attends Religious Services: More than 4 times per year    Active Member of [employer] or Organizations: No    Attends Banker Meetings: Never    Marital Status: Married     Review of Systems: A 12 point ROS discussed and pertinent positives are indicated in the HPI above.  All other systems are negative.   Vital Signs: BP (!) 158/88 (BP Location:  Right Arm)   Pulse 68   Temp 98.5 F (36.9 C) (Oral)   Resp 13   Ht 5' 7 (1.702 m)   Wt 171 lb (77.6 kg)   SpO2 93%   BMI 26.78 kg/m   Advance Care Plan: No documents on file  Physical Exam Vitals and nursing note reviewed.  Constitutional:      General: He is not in acute distress. HENT:     Mouth/Throat:     Mouth: Mucous membranes are moist.     Pharynx: Oropharynx is clear.  Cardiovascular:     Rate and Rhythm: Normal rate and regular rhythm.  Pulmonary:     Effort: Pulmonary effort is normal.     Breath sounds: Normal breath sounds.  Abdominal:     Palpations: Abdomen is soft.     Tenderness: There is no abdominal tenderness.  Musculoskeletal:     Right lower leg: No edema.     Left lower leg: No edema.  Skin:    General: Skin is warm and dry.  Neurological:     Mental Status: He is alert and oriented to person, place, and time. Mental status is at baseline.     Imaging: CT CHEST WO CONTRAST Result Date: 05/05/2024 EXAM: CT CHEST WITHOUT CONTRAST 05/05/2024 04:00:00 PM TECHNIQUE: CT of the chest was performed without the administration of intravenous contrast. Multiplanar reformatted images are provided for review. Automated exposure control, iterative reconstruction, and/or weight based adjustment of the mA/kV was utilized to reduce the radiation dose to as low as  reasonably achievable. COMPARISON: None available. CLINICAL HISTORY: renal mass, staging. FINDINGS: MEDIASTINUM: \Moderate coronary atherosclerosis of the LAD and right coronary artery. Pericardium is unremarkable. The central airways are clear. Mild thoracic atherosclerosis. LYMPH NODES: No mediastinal, hilar or axillary lymphadenopathy. LUNGS AND PLEURA: 2 left lower lobe nodules measuring 2-3 mm (images 97 and 110), technically indeterminate but likely benign. Trace bilateral pleural effusions. No focal consolidation or pulmonary edema. No pneumothorax. SOFT TISSUES/BONES: Moderate changes of the thoracic spine with mild dextroscoliosis. No acute abnormality of the soft tissues. UPPER ABDOMEN: Visualized upper abdomen is unchanged from recent CT abdomen and pelvis noting a large right renal mass. IMPRESSION: 1. Two 23 mm left lower lobe pulmonary nodules, technically indeterminate but likely benign. In the setting of known malignancy, consider follow-up CT chest in 3-6 months. 2. Trace bilateral pleural effusions. 3. Large right renal mass, unchanged from recent CT abdomen and pelvis. Electronically signed by: Pinkie Pebbles MD 05/05/2024 07:24 PM EDT RP Workstation: [id number]   CT ABDOMEN PELVIS WO CONTRAST Result Date: 05/04/2024 EXAM: CT ABDOMEN AND PELVIS WITHOUT CONTRAST 05/04/2024 07:16:37 PM TECHNIQUE: CT of the abdomen and pelvis was performed without the administration of intravenous contrast. Multiplanar reformatted images are provided for review. Automated exposure control, iterative reconstruction, and/or weight-based adjustment of the mA/kV was utilized to reduce the radiation dose to as low as reasonably achievable. COMPARISON: None available. CLINICAL HISTORY: Abdominal mass, palpable. Patient reports a hardened mass on the right side of the abdomen, denies pain, first noticed Sunday. Normal bowel movements, last bowel movement this morning. FINDINGS: LOWER CHEST: No acute abnormality.  LIVER: The liver is unremarkable. GALLBLADDER AND BILE DUCTS: Gallbladder is unremarkable. No biliary ductal dilatation. SPLEEN: No acute abnormality. PANCREAS: No acute abnormality. ADRENAL GLANDS: The adrenal glands are unremarkable. KIDNEYS, URETERS AND BLADDER: A 15.4 x 19.1 x 16.7 cm mass is seen involving the interpolar region of the right kidney, likely representing a primary renal cell  carcinoma. Variable density is likely related to areas of central necrosis. Faint Dystrophic calcification is seen throughout the mass. The mass demonstrates endophytic and exophytic extension with mass effect upon the adjacent gallbladder and ascending colon without evidence of direct invasion, as well as extension into the right renal pelvis without definite involvement of the right renal vein or proximal right ureter. No hydronephrosis of the right kidney is seen. The mass appears to extend beyond Gerota fascia laterally, where it abuts the peritoneal lining, with significant mass effect upon the inferior vena cava, which is slit-like in configuration. Multiple indeterminate exophytic cortical lesions are seen arising from the left kidney, measuring up to 15 mm, which are not well characterized on this noncontrast examination. No hydronephrosis on the left. No intrarenal or ureteral calculi. The bladder is partially obscured by artifact but is otherwise unremarkable. GI AND BOWEL: Appendix is normal. Stomach, small bowel, and large bowel are otherwise unremarkable. PERITONEUM AND RETROPERITONEUM: No ascites. No free air. VASCULATURE: The inferior vena cava is slit-like in configuration, best seen at image number 38, series number 2, without direct invasion of the structure. LYMPH NODES: No adjacent pathologic adenopathy is identified. REPRODUCTIVE ORGANS: No acute abnormality. BONES AND SOFT TISSUES: Left total hip arthroplasty has been performed. Osseous structures are otherwise age-appropriate. No lytic or blastic bone  lesions. IMPRESSION: 1. Large right renal mass (15.4 x 19.1 x 16.7 cm) with variable density, likely representing primary renal cell carcinoma with central necrosis. The mass demonstrates endophytic and exophytic extension, mass effect on the gallbladder and ascending colon without direct invasion, and extension into the right renal pelvis without definite involvement of the right renal vein or proximal right ureter. The mass also extends beyond Gerota fascia laterally, abutting the peritoneal lining, and causes significant mass effect on the inferior vena cava without direct invasion. No adjacent pathologic adenopathy or hydronephrosis. 2. Multiple indeterminate exophytic cortical lesions in the left kidney, measuring up to 15 mm, not well characterized on this noncontrast examination. Dedicated contrast-enhanced MRI examination is recommended for further evaluation. 3. Non-emergent CT examination of the chest is recommended for staging purposes. Electronically signed by: Dorethia Molt MD 05/04/2024 07:43 PM EDT RP Workstation: [id number]    Labs:  CBC: Recent Labs    04/19/24 1041 05/03/24 1039 05/04/24 1552 05/06/24 0258  WBC  --   --  6.0 7.0  HGB 8.8* 9.0* 8.1* 8.6*  HCT  --   --  25.2* 26.4*  PLT  --   --  260 275    COAGS: Recent Labs    05/06/24 0258  INR 1.2    BMP: Recent Labs    09/08/23 1100 05/04/24 1552 05/06/24 0258  NA 145 139 137  K 3.7 4.4 3.9  CL 115* 99 102  CO2 14* 16* 16*  GLUCOSE 105* 109* 83  BUN 122* 149* 157*  CALCIUM  9.4  9.1 9.7 9.1  CREATININE 12.04* 14.20* 15.64*  GFRNONAA 4* 3* 3*    LIVER FUNCTION TESTS: Recent Labs    09/08/23 1100 05/04/24 1552  BILITOT 0.6 0.5  AST 10* 15  ALT 8 6  ALKPHOS 57 84  PROT 7.1 7.5  ALBUMIN 3.7 4.4    TUMOR MARKERS: No results for input(s): AFPTM, CEA, CA199, CHROMGRNA in the last 8760 hours.  Assessment and Plan:  Marvin Garcia is a 68 y.o. male with hx of CKD5 (not previously on  dialysis), HTN. Pt presented to [facility] with abdominal pain and fullness with a bulge  in the left abdomen, first noticed 5 days prior. Labs were notable for Cr of 14.20 (prev 12 08/2023 and 4.09 10/2019). CT abdomen showed large right sided renal mass 15.4 x 19.1 x 16.7 cm concerning for renal cell carcinoma. Nephrology and urology were consulted, with decision being to begin dialysis and follow up with urology surgeon outpatient to discuss open nephrectomy. Per nephro, patient to be on permanent dialysis either way. IR has now been consulted for tunneled hemodialysis catheter placement.  Case and imaging reviewed with IR attending Dr. Hughes with plan to proceed.  Today pt feeling tired, but no other new complaints. Did have small amount of food around 8am this morning. Plan to proceed with single drug mild sedation with local anesthesia for tunneled HD cath placement.  Risks and benefits discussed with the patient including, but not limited to bleeding, infection, vascular injury, pneumothorax which may require chest tube placement, air embolism or even death. All of the patient's questions were answered, patient is agreeable to proceed. Consent signed and in chart.   Thank you for allowing our service to participate in Chadwin Fury 's care.  Electronically Signed: Kimble VEAR Clas, PA-C   05/07/2024, 10:30 AM      I spent a total of 20 Minutes    in face to face in clinical consultation, greater than 50% of which was counseling/coordinating care for tunneled hemodialysis catheter.

## 2024-05-07 NOTE — Procedures (Signed)
 Vascular and Interventional Radiology Procedure Note  Patient: Marvin Garcia DOB: 08/17/1955 Medical Record Number: [id number] Note Date/Time: 05/07/24 1:12 PM   Performing Physician: Thom Hall, MD Assistant(s): None  Diagnosis: ESRD requiring Hemodialysis  Procedure: TUNNELED HEMODIALYSIS CATHETER PLACEMENT  Anesthesia: Conscious Sedation Complications: None Estimated Blood Loss: Minimal Specimens:  None  Findings:  Successful placement of right-sided, 19 cm (tip-to-cuff), tunneled hemodialysis catheter with the tip of the catheter in the proximal right atrium.  Plan: Catheter ready for use.  See detailed procedure note with images in PACS. The patient tolerated the procedure well without incident or complication and was returned to Recovery in stable condition.    Thom Hall, MD [facility]   Pager. [phone] Clinic. [phone]

## 2024-05-07 NOTE — Procedures (Signed)
 HD Note:  Some information was entered later than the data was gathered due to patient care needs. The stated time with the data is accurate.  Received patient in bed to unit.   Alert and oriented.   Informed consent signed and in chart.   Access used: Upper right chest tunneled HD catheter Access issues: no issues  Patient tolerated treatment well.   TX duration: 2 hours  Alert, without acute distress.  Total UF removed: Patient was kept even per orders.  Hand-off given to patient's nurse.   Transported back to the room   Vickii Volland L. Lenon, RN Kidney Dialysis Unit.

## 2024-05-07 NOTE — Progress Notes (Signed)
 Admit: 05/04/2024 LOS: 2  Marvin Garcia progressive CKD5 admitted with large R renal mass concering for RCC  Subjective:  No interval events Urology notes reviewed, has a potentially curable RCC Discussed that if surgical cure is his goal then  he needs to initiate long-term/permanent hemodialysis, wife joined us  for this conversation by phone, IR to place [facility] today   09/26 0701 - 09/27 0700 In: -  Out: 575 [Urine:575]  Filed Weights   05/04/24 1547  Weight: 77.6 kg    Scheduled Meds:  carvedilol   12.5 mg Oral BID WC   cloNIDine   0.2 mg Oral TID   heparin   5,000 Units Subcutaneous Q8H   hydrALAZINE   100 mg Oral TID   isosorbide  mononitrate  60 mg Oral Daily   Continuous Infusions: PRN Meds:.acetaminophen  **OR** acetaminophen , hydrALAZINE , ondansetron  **OR** ondansetron  (ZOFRAN ) IV, senna-docusate  Current Labs: reviewed   Physical Exam:  Blood pressure (!) 158/88, pulse 68, temperature 98.5 F (36.9 C), temperature source Oral, resp. rate 13, height 5' 7 (1.702 m), weight 77.6 kg, SpO2 93%. Gen: comfortable, awake and alert  Eyes:  EOMI ENT: MMM Neck: supple, no JVD CV: RRR, II/VI SEM, no rub Abd:  soft, nontender Lungs: clear GU: no foley Extr: no edema Neuro:no asterixis, conversant, attentive Skin: no rashes or lesions  A Longstanding and progressive CKD 5 with significant azotemia followed by Dr. Gearline Large right renal mass concerning for extensive RCC, urology thinks potentially curable with surgery AGMA in the setting of #1, monitor Anemia: Holding ESA given #2.  TSAT 13%.  Monitor. Hypertension, monitor on current regimen  P Initiate hemodialysis today, no UF, no heparin ,  IR consulted for hemodialysis tunneled catheter placement Gradual up titration of HD Rx Will need CLIP start on Monday Eventually to need AVF evaluation but would let urological surgical plan be defined first Medication Issues; Preferred narcotic agents for pain control are hydromorphone ,  fentanyl , and methadone. Morphine should not be used.  Baclofen should be avoided Avoid oral sodium phosphate  and magnesium  citrate based laxatives / bowel preps   Case and plan discussed with Dr. Dennise [facility]   Bernardino Gasman MD 05/07/2024, 10:11 AM  Recent Labs  Lab 05/04/24 1552 05/06/24 0258  NA 139 137  K 4.4 3.9  CL 99 102  CO2 16* 16*  GLUCOSE 109* 83  BUN 149* 157*  CREATININE 14.20* 15.64*  CALCIUM  9.7 9.1   Recent Labs  Lab 05/03/24 1039 05/04/24 1552 05/06/24 0258  WBC  --  6.0 7.0  NEUTROABS  --  4.2  --   HGB 9.0* 8.1* 8.6*  HCT  --  25.2* 26.4*  MCV  --  75.2* 74.4*  PLT  --  260 275

## 2024-05-08 ENCOUNTER — Inpatient Hospital Stay (HOSPITAL_COMMUNITY)

## 2024-05-08 DIAGNOSIS — N179 Acute kidney failure, unspecified: Secondary | ICD-10-CM | POA: Diagnosis not present

## 2024-05-08 DIAGNOSIS — I480 Paroxysmal atrial fibrillation: Secondary | ICD-10-CM

## 2024-05-08 DIAGNOSIS — I10 Essential (primary) hypertension: Secondary | ICD-10-CM

## 2024-05-08 DIAGNOSIS — I4891 Unspecified atrial fibrillation: Secondary | ICD-10-CM

## 2024-05-08 DIAGNOSIS — N185 Chronic kidney disease, stage 5: Secondary | ICD-10-CM

## 2024-05-08 LAB — PHOSPHORUS: Phosphorus: 5 mg/dL — ABNORMAL HIGH (ref 2.5–4.6)

## 2024-05-08 LAB — HEPARIN LEVEL (UNFRACTIONATED)
Heparin Unfractionated: 0.16 [IU]/mL — ABNORMAL LOW (ref 0.30–0.70)
Heparin Unfractionated: 0.25 [IU]/mL — ABNORMAL LOW (ref 0.30–0.70)

## 2024-05-08 LAB — MRSA NEXT GEN BY PCR, NASAL: MRSA by PCR Next Gen: NOT DETECTED

## 2024-05-08 LAB — CBC
HCT: 26.9 % — ABNORMAL LOW (ref 39.0–52.0)
Hemoglobin: 8.9 g/dL — ABNORMAL LOW (ref 13.0–17.0)
MCH: 24.5 pg — ABNORMAL LOW (ref 26.0–34.0)
MCHC: 33.1 g/dL (ref 30.0–36.0)
MCV: 73.9 fL — ABNORMAL LOW (ref 80.0–100.0)
Platelets: 280 K/uL (ref 150–400)
RBC: 3.64 MIL/uL — ABNORMAL LOW (ref 4.22–5.81)
RDW: 19.1 % — ABNORMAL HIGH (ref 11.5–15.5)
WBC: 8.4 K/uL (ref 4.0–10.5)
nRBC: 0.7 % — ABNORMAL HIGH (ref 0.0–0.2)

## 2024-05-08 LAB — T4, FREE: Free T4: 0.86 ng/dL (ref 0.61–1.12)

## 2024-05-08 LAB — ECHOCARDIOGRAM COMPLETE
AR max vel: 1.49 cm2
AV Area VTI: 1.55 cm2
AV Area mean vel: 1.59 cm2
AV Mean grad: 7.5 mmHg
AV Peak grad: 15.8 mmHg
Ao pk vel: 1.99 m/s
Area-P 1/2: 1.86 cm2
Height: 67 in
S' Lateral: 2.5 cm
Weight: 2617.3 [oz_av]

## 2024-05-08 LAB — TROPONIN I (HIGH SENSITIVITY)
Troponin I (High Sensitivity): 266 ng/L (ref <18)
Troponin I (High Sensitivity): 275 ng/L (ref <18)

## 2024-05-08 LAB — MAGNESIUM: Magnesium: 2.2 mg/dL (ref 1.7–2.4)

## 2024-05-08 LAB — TSH: TSH: 2.816 u[IU]/mL (ref 0.350–4.500)

## 2024-05-08 MED ORDER — DILTIAZEM HCL ER COATED BEADS 180 MG PO CP24
180.0000 mg | ORAL_CAPSULE | Freq: Every day | ORAL | Status: DC
  Filled 2024-05-08: qty 1

## 2024-05-08 MED ORDER — METOPROLOL TARTRATE 5 MG/5ML IV SOLN
5.0000 mg | INTRAVENOUS | Status: DC | PRN
  Administered 2024-05-08 – 2024-05-09 (×6): 5 mg via INTRAVENOUS
  Filled 2024-05-08 (×6): qty 5

## 2024-05-08 MED ORDER — DILTIAZEM HCL ER COATED BEADS 180 MG PO CP24
180.0000 mg | ORAL_CAPSULE | Freq: Every day | ORAL | Status: DC
  Administered 2024-05-08 – 2024-05-09 (×2): 180 mg via ORAL
  Filled 2024-05-08 (×2): qty 1

## 2024-05-08 MED ORDER — HEPARIN (PORCINE) 25000 UT/250ML-% IV SOLN
1200.0000 [IU]/h | INTRAVENOUS | Status: DC
  Administered 2024-05-08: 1200 [IU]/h via INTRAVENOUS
  Filled 2024-05-08: qty 250

## 2024-05-08 MED ORDER — HEPARIN BOLUS VIA INFUSION
500.0000 [IU] | Freq: Once | INTRAVENOUS | Status: AC
  Administered 2024-05-08: 500 [IU] via INTRAVENOUS
  Filled 2024-05-08: qty 500

## 2024-05-08 MED ORDER — DILTIAZEM LOAD VIA INFUSION
15.0000 mg | Freq: Once | INTRAVENOUS | Status: AC
  Administered 2024-05-08: 15 mg via INTRAVENOUS
  Filled 2024-05-08: qty 15

## 2024-05-08 MED ORDER — HEPARIN (PORCINE) 25000 UT/250ML-% IV SOLN
1500.0000 [IU]/h | INTRAVENOUS | Status: DC
  Administered 2024-05-08 (×2): 1400 [IU]/h via INTRAVENOUS
  Filled 2024-05-08: qty 250

## 2024-05-08 MED ORDER — CARVEDILOL 25 MG PO TABS
25.0000 mg | ORAL_TABLET | Freq: Two times a day (BID) | ORAL | Status: DC
  Administered 2024-05-08 – 2024-05-11 (×7): 25 mg via ORAL
  Filled 2024-05-08 (×7): qty 1

## 2024-05-08 MED ORDER — DILTIAZEM HCL-DEXTROSE 125-5 MG/125ML-% IV SOLN (PREMIX)
5.0000 mg/h | INTRAVENOUS | Status: DC
  Administered 2024-05-08: 5 mg/h via INTRAVENOUS
  Filled 2024-05-08: qty 125

## 2024-05-08 MED ORDER — DILTIAZEM HCL 60 MG PO TABS
90.0000 mg | ORAL_TABLET | Freq: Three times a day (TID) | ORAL | Status: DC
  Administered 2024-05-08: 90 mg via ORAL
  Filled 2024-05-08: qty 1

## 2024-05-08 NOTE — Progress Notes (Addendum)
 Patient still with RVR  Will start cardizem drip with bolus  Will also start on heparin  for afib Patient also with elevated CE thought to be due to demand.  Will continue to trend Echo pending for am.  Case discussed with on call cardiologist Dr Cesario

## 2024-05-08 NOTE — Progress Notes (Signed)
*  PRELIMINARY RESULTS* Echocardiogram 2D Echocardiogram has been performed.  Marvin Garcia 05/08/2024, 4:55 PM

## 2024-05-08 NOTE — Progress Notes (Signed)
 PHARMACY - ANTICOAGULATION CONSULT NOTE  Pharmacy Consult for heparin  Indication: atrial fibrillation  Allergies  Allergen Reactions   Hydrocodone Other (See Comments)    Severe hiccups    Patient Measurements: Height: 5' 7 (170.2 cm) Weight: 74.2 kg (163 lb 9.3 oz) IBW/kg (Calculated) : 66.1 HEPARIN  DW (KG): 77.6  Vital Signs: Temp: 98.3 F (36.8 C) (09/28 0400) Temp Source: Oral (09/28 0400) BP: 155/95 (09/28 0800) Pulse Rate: 67 (09/28 0800)  Labs: Recent Labs    05/06/24 0258 05/07/24 1223 05/08/24 0015 05/08/24 0359  HGB 8.6*  --   --  8.9*  HCT 26.4*  --   --  26.9*  PLT 275  --   --  280  LABPROT 15.4*  --   --   --   INR 1.2  --   --   --   CREATININE 15.64* 16.76*  --   --   TROPONINIHS  --   --  266* 275*    Estimated Creatinine Clearance: 4 mL/min (A) (by C-G formula based on SCr of 16.76 mg/dL (H)).   Medical History: Past Medical History:  Diagnosis Date   Arrhythmia 1991   patient  describes that he woke up surrounded by doctors saying that his heart had stopped while sleeping; denies having pain during this event; also denies any reccurrent issues after that episode; says that was when they told me i had high blood pressure    Arthritis    Chronic kidney disease    stage IV managed by Dr Arthurine leash kidney    Headache    Hypertension    Umbilical hernia    Assessment: 42 yoM presented with abdominal discomfort found to be in afib RVR. Pharmacy consulted to dose heparin  for afib. Previously on [place] heparin  for DVT ppx (Last dose 9/27 ~2200). No PTA anticoagulation. ESRD now starting HD.  Heparin  level subtherapeutic at 0.16 on rate of 1200 units/hr. Hgb 8.9 and PLT 280. Per RN, no bleeding or issues with the heparin  infusion.  Will give a small bolus given low Hgb  and increase rate.  Goal of Therapy:  Heparin  level 0.3-0.7 units/ml Monitor platelets by anticoagulation protocol: Yes   Plan:  500 unit bolus Increase heparin   infusion at 1400 units/hr Check anti-Xa level in 8 hours and daily while on heparin  Continue to monitor H&H and platelets  Prentice DOROTHA Favors, PharmD PGY1 Health-System Pharmacy Administration and Leadership Resident [facility]  05/08/2024 1:34 PM

## 2024-05-08 NOTE — Plan of Care (Signed)

## 2024-05-08 NOTE — Consult Note (Addendum)
 As below, pt seen and examined; briefly he is a 68 year old male with past medical history of chronic kidney disease, anemia, hypertension admitted with new diagnosis of renal cell carcinoma/mass for evaluation of atrial fibrillation.  Patient admitted with fatigue and abdominal fullness.  CT shows renal cell mass on the right concerning for renal cell carcinoma.  Creatinine also increased at 15.64.  He was found to be in atrial fibrillation with rapid ventricular response and cardiology asked to evaluate.  He denies dyspnea, chest pain or syncope.  Several weeks ago he felt palpitations but is otherwise asymptomatic.  Laboratories show hemoglobin 8.9, platelet count 280, troponin 266 and 275, creatinine 16.76, normal TSH.  Electrocardiogram shows atrial fibrillation with rapid ventricular response, possible limb lead reversal.  Abdominal CT shows large right renal mass. 1 paroxysmal atrial fibrillation-patient was noted to be in newly diagnosed atrial fibrillation with rapid ventricular response.  He has converted to sinus rhythm.  Will continue carvedilol  and change IV Cardizem to Cardizem CD.  If he has recurrences best option may be amiodarone particularly in light of upcoming surgery for nephrectomy.  CHA2DS2-VASc is 3 for age 89, hypertension and vascular disease.  However he would be at increased risk of bleeding with anticoagulation at present with large right renal mass.  He is scheduled for nephrectomy in the next 2 to 3 weeks.  Once hemostasis has been achieved following nephrectomy would begin apixaban 5 mg twice daily.  Check echocardiogram for LV function.  Note TSH is normal. 2 hypertension-blood pressure has been elevated.  Would continue present medications and we will advance as needed. 3 acute on chronic kidney disease-dialysis has been initiated. 4 renal mass-felt to be likely renal cell carcinoma.  For nephrectomy as outlined. 5 anemia-likely due to renal insufficiency.  Redell Shallow, MD      Cardiology Consultation   Patient ID: Sylis Ketchum MRN: [id number]; DOB: 1956/07/30  Admit date: 05/04/2024 Date of Consult: 05/08/2024  PCP:  Patient, No Pcp Per   [facility] Providers Cardiologist:  Redell Shallow, MD      Patient Profile: Rishon Thilges is a 68 y.o. male with a hx of CKD 5, HTN, and chronic anemia who is being seen 05/08/2024 for the evaluation of Afib RVR at the request of Dr. Dennise.  History of Present Illness: Mr. Darroch with no prior cardiac history presented to ER with abdominal pain and fullness. CT WO showed large R renal mass with extension, mass effect on IVC. He was transferred to [facility] for urology and nephrology consults.   CT abdomen with large right sided renal mass measuring 15.4 x 19.1 x 16.7 concerning for renal cell carcinoma. Serum creatinine was increased to 12 and has continued to trend up to 15.64. CO2 now 16. Hb has remained stable in the 8-9 range. He started HD on 05/07/24.   Overnight, he converted to Afib with RVR and cardiology was consulted.   HS troponin 266 --> 275 TSH 2.816  Pt was started on cardizem gtt and transitioned to 90 mg cardizem TID. He was also started on heparin  gtt.   Telemetry reveals conversion to SR in the 60s yesterday.   During my interview, he reports approximately 1 year history of fatigue.  He also reports intermittent dizziness not associated with position change over the past 6 months, no syncope.  He does not have a personal history of heart disease. He denies chest pain.   CT chest does show moderate coronary atherosclerosis in the LAD  and RCA.  He does not currently follow with a cardiologist.  He remains active at home and just obtained an elliptical machine.  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  Family history includes CAD in his father and DM in his mother.  He is a never smoker and has not drank alcohol since 1991.   He does not use THC or illicit drugs.  He is married with 2 children and 2 grandchildren.   Past Medical History:  Diagnosis Date   Arrhythmia 1991   patient  describes that he woke up surrounded by doctors saying that his heart had stopped while sleeping; denies having pain during this event; also denies any reccurrent issues after that episode; says that was when they told me i had high blood pressure    Arthritis    Chronic kidney disease    stage IV managed by Dr Arthurine leash kidney    Headache    Hypertension    Umbilical hernia     Past Surgical History:  Procedure Laterality Date   HERNIA REPAIR     inguinal    INSERTION OF MESH N/A 01/21/2017   Procedure: INSERTION OF MESH;  Surgeon: Signe Mitzie LABOR, MD;  Location: [facility];  Service: General;  Laterality: N/A;   IR FLUORO GUIDE CV LINE RIGHT  05/07/2024   IR US  GUIDE VASC ACCESS RIGHT  05/07/2024   JOINT REPLACEMENT     KNEE ARTHROSCOPY     TOTAL HIP ARTHROPLASTY Left 03/19/2017   Procedure: LEFT TOTAL HIP ARTHROPLASTY ANTERIOR APPROACH;  Surgeon: Fidel Rogue, MD;  Location: [facility];  Service: Orthopedics;  Laterality: Left;  Needs RNFA   TOTAL KNEE ARTHROPLASTY  06/04/2012   Procedure: TOTAL KNEE ARTHROPLASTY;  Surgeon: Lamar Collet, MD;  Location: [facility];  Service: Orthopedics;  Laterality: Left;   TOTAL KNEE ARTHROPLASTY Right 10/14/2019   Procedure: TOTAL KNEE ARTHROPLASTY;  Surgeon: Collet Lamar, MD;  Location: [facility];  Service: Orthopedics;  Laterality: Right;  adductor canal   UMBILICAL HERNIA REPAIR N/A 01/21/2017   Procedure: UMBILICAL HERNIA REPAIR WITH MESH;  Surgeon: Signe Mitzie LABOR, MD;  Location: [facility];  Service: General;  Laterality: N/A;     Home Medications:  Prior to Admission medications   Medication Sig Start Date End Date Taking? Authorizing Provider  acetaminophen  (TYLENOL ) 650 MG CR tablet Take 650-1,300 mg by mouth every 8 (eight) hours as needed for pain.   Yes [provider]   amLODipine  (NORVASC ) 10 MG tablet Take 10 mg by mouth at bedtime.  10/03/19  Yes [provider]  Ascorbic Acid  (VITAMIN C ) 1000 MG tablet Take 1,000 mg by mouth daily.   Yes [provider]  carvedilol  (COREG ) 25 MG tablet Take 25 mg by mouth 2 (two) times daily with a meal.   Yes [provider]  cloNIDine  (CATAPRES ) 0.3 MG tablet Take 0.3 mg by mouth in the morning, at noon, and at bedtime.   Yes [provider]  Coenzyme Q10 (COQ-10) 100 MG CAPS Take 100 mg by mouth daily.   Yes [provider]  Febuxostat  80 MG TABS Take 80 mg by mouth daily.    Yes [provider]  hydrALAZINE  (APRESOLINE ) 100 MG tablet Take 100 mg by mouth 3 (three) times daily.   Yes [provider]  isosorbide  mononitrate (IMDUR ) 30 MG 24 hr tablet Take 30 mg by mouth daily. 09/08/19  Yes [provider]  Multiple Vitamin (MULTIVITAMIN WITH MINERALS) TABS tablet Take 1 tablet by mouth daily. *alive mens 50+*   Yes [provider]  Omega-3 Fatty Acids (OMEGA 3 500) 500 MG CAPS Take 500 mg by mouth daily.   Yes [provider]  OVER THE COUNTER MEDICATION Take 1 tablet by mouth daily. **vitra-cq10-100 mg*   Yes [provider]  OVER THE COUNTER MEDICATION Take 1 tablet by mouth daily. **ultimate iron 50 mg**   Yes [provider]  aspirin  81 MG chewable tablet Chew 1 tablet (81 mg total) by mouth 2 (two) times daily. Patient not taking: Reported on 05/05/2024 03/20/17   Fidel Rogue, MD  irbesartan  (AVAPRO ) 150 MG tablet Take 150 mg by mouth daily.    [provider]  methocarbamol  (ROBAXIN ) 500 MG tablet Take 1 tablet (500 mg total) by mouth 4 (four) times daily. Patient not taking: Reported on 05/05/2024 10/14/19   Haus, Leeanne R, PA    Scheduled Meds:  carvedilol   25 mg Oral BID WC   Chlorhexidine  Gluconate Cloth  6 each Topical Daily   diltiazem  90 mg Oral Q8H   hydrALAZINE   100 mg Oral TID    isosorbide  mononitrate  60 mg Oral Daily   Continuous Infusions:  diltiazem (CARDIZEM) infusion 7.5 mg/hr (05/08/24 0700)   heparin  1,200 Units/hr (05/08/24 0700)   PRN Meds: acetaminophen  **OR** acetaminophen , hydrALAZINE , metoprolol tartrate, ondansetron  **OR** ondansetron  (ZOFRAN ) IV, senna-docusate  Allergies:    Allergies  Allergen Reactions   Hydrocodone Other (See Comments)    Severe hiccups    Social History:   Social History   Socioeconomic History   Marital status: Married    Spouse name: Not on file   Number of children: Not on file   Years of education: Not on file   Highest education level: Not on file  Occupational History   Not on file  Tobacco Use   Smoking status: Never   Smokeless tobacco: Never  Vaping Use   Vaping status: Never Used  Substance and Sexual Activity   Alcohol use: No   Drug use: No   Sexual activity: Not on file  Other Topics Concern   Not on file  Social History Narrative   Not on file   Social Drivers of Health   Financial Resource Strain: Not on file  Food Insecurity: No Food Insecurity (05/05/2024)   Hunger Vital Sign    Worried About Running Out of Food in the Last Year: Never true    Ran Out of Food in the Last Year: Never true  Transportation Needs: No Transportation Needs (05/05/2024)   PRAPARE - Administrator, Civil Service (Medical): No    Lack of Transportation (Non-Medical): No  Physical Activity: Not on file  Stress: Not on file  Social Connections: Moderately Isolated (05/05/2024)   Social Connection and Isolation Panel    Frequency of Communication with Friends and Family: Twice a week    Frequency of Social Gatherings with Friends and Family: Never    Attends Religious Services: More than 4 times per year    Active Member of [employer] or Organizations: No    Attends Banker Meetings: Never    Marital Status: Married  Catering manager Violence: Not At Risk (05/05/2024)   Humiliation,  Afraid, Rape, and Kick questionnaire    Fear of Current or Ex-Partner: No    Emotionally Abused: No    Physically Abused: No    Sexually Abused: No  Family History:    Family History  Problem Relation Age of Onset   Diabetes Mother    Heart disease Father    Asthma Son    Diabetes Maternal Grandmother      ROS:  Please see the history of present illness.   All other ROS reviewed and negative.     Physical Exam/Data: Vitals:   05/08/24 0600 05/08/24 0630 05/08/24 0700 05/08/24 0800  BP: (!) 137/98 (!) 153/83 (!) 146/91 (!) 155/95  Pulse: 65 60 69 67  Resp: 11 20 19 17   Temp:      TempSrc:      SpO2: 98% 95% 96% 96%  Weight:      Height:        Intake/Output Summary (Last 24 hours) at 05/08/2024 1035 Last data filed at 05/08/2024 0736 Gross per 24 hour  Intake 115.73 ml  Output 850 ml  Net -734.27 ml      05/07/2024    5:46 PM 05/04/2024    3:47 PM 04/07/2023   10:39 AM  Last 3 Weights  Weight (lbs) 163 lb 9.3 oz 171 lb 192 lb  Weight (kg) 74.2 kg 77.565 kg 87.091 kg     Body mass index is 25.62 kg/m.  General:  Well nourished, well developed, in no acute distress HEENT: normal Neck: no JVD Vascular: No carotid bruits; Distal pulses 2+ bilaterally Cardiac:  normal S1, S2; RRR,2/6 systolic murmur on right sternal border Lungs:  clear to auscultation bilaterally, no wheezing, rhonchi or rales  Abd: soft, nontender, no hepatomegaly  Ext: no edema Musculoskeletal:  No deformities, BUE and BLE strength normal and equal Skin: warm and dry  Neuro:  CNs 2-12 intact, no focal abnormalities noted Psych:  Normal affect   EKG:  The EKG was personally reviewed and demonstrates:  afib with VR 146 Telemetry:  Telemetry was personally reviewed and demonstrates:  SR-SB 50-70s, PVCs  Relevant CV Studies:  Echo pending for today   Laboratory Data: High Sensitivity Troponin:   Recent Labs  Lab 05/08/24 0015 05/08/24 0359  TROPONINIHS 266* 275*      Chemistry Recent Labs  Lab 05/04/24 1552 05/06/24 0258 05/07/24 1223 05/08/24 0015  NA 139 137 138  --   K 4.4 3.9 3.8  --   CL 99 102 103  --   CO2 16* 16* 16*  --   GLUCOSE 109* 83 122*  --   BUN 149* 157* 158*  --   CREATININE 14.20* 15.64* 16.76*  --   CALCIUM  9.7 9.1 9.5  --   MG  --   --   --  2.2  GFRNONAA 3* 3* 3*  --   ANIONGAP 24* 19* 19*  --     Recent Labs  Lab 05/04/24 1552 05/07/24 1223  PROT 7.5  --   ALBUMIN 4.4 3.6  AST 15  --   ALT 6  --   ALKPHOS 84  --   BILITOT 0.5  --    Lipids No results for input(s): CHOL, TRIG, HDL, LABVLDL, LDLCALC, CHOLHDL in the last 168 hours.  Hematology Recent Labs  Lab 05/04/24 1552 05/06/24 0258 05/08/24 0359  WBC 6.0 7.0 8.4  RBC 3.35* 3.55* 3.64*  HGB 8.1* 8.6* 8.9*  HCT 25.2* 26.4* 26.9*  MCV 75.2* 74.4* 73.9*  MCH 24.2* 24.2* 24.5*  MCHC 32.1 32.6 33.1  RDW 18.4* 18.8* 19.1*  PLT 260 275 280   Thyroid  Recent Labs  Lab 05/08/24 0015  TSH  2.816  FREET4 0.86    BNPNo results for input(s): BNP, PROBNP in the last 168 hours.  DDimer No results for input(s): DDIMER in the last 168 hours.  Radiology/Studies:  IR Fluoro Guide CV Line Right Result Date: 05/07/2024 INDICATION: ESRD requiring HD initiation EXAM: TUNNELED CENTRAL VENOUS HEMODIALYSIS CATHETER PLACEMENT WITH ULTRASOUND AND FLUOROSCOPIC GUIDANCE MEDICATIONS: Ancef  2 gm IV. The antibiotic was given in an appropriate time interval prior to skin puncture. ANESTHESIA/SEDATION: Local anesthetic and single agent sedation was employed during this procedure. A total of fentanyl  75 mcg was administered intravenously. The patient's level of consciousness and vital signs were monitored continuously by radiology nursing throughout the procedure under my direct supervision. FLUOROSCOPY: Radiation Exposure Index and estimated peak skin dose (PSD); Reference air kerma (RAK), 2.3 mGy. COMPLICATIONS: None immediate. PROCEDURE: Informed written  consent was obtained from the patient and/or patient's representative after a discussion of the risks, benefits, and alternatives to treatment. Questions regarding the procedure were encouraged and answered. The RIGHT neck and chest were prepped with chlorhexidine  in a sterile fashion, and a sterile drape was applied covering the operative field. Maximum barrier sterile technique with sterile gowns and gloves were used for the procedure. A timeout was performed prior to the initiation of the procedure. After creating a small venotomy incision, a micropuncture kit was utilized to access the internal jugular vein. Real-time ultrasound guidance was utilized for vascular access including the acquisition of a permanent ultrasound image documenting patency of the accessed vessel. The microwire was utilized to measure appropriate catheter length. A stiff Glidewire was advanced to the level of the IVC and the micropuncture sheath was exchanged for a peel-away sheath. A palindrome tunneled hemodialysis catheter measuring 19 cm from tip to cuff was tunneled in a retrograde fashion from the anterior chest wall to the venotomy incision. The catheter was then placed through the peel-away sheath with tips ultimately positioned within the superior aspect of the right atrium. Final catheter positioning was confirmed and documented with a spot radiographic image. The catheter aspirates and flushes normally. The catheter was flushed with appropriate volume heparin  dwells. The catheter exit site was secured with a 0-Prolene retention suture. The venotomy incision was closed with Dermabond. Dressings were applied. The patient tolerated the procedure well without immediate post procedural complication. IMPRESSION: Successful placement of 19 cm tip to cuff tunneled hemodialysis catheter via the RIGHT internal jugular vein. The tip of the catheter is positioned at the superior cavo-atrial junction. The catheter is ready for immediate use.  Thom Hall, MD [facility] Radiology Electronically Signed   By: Thom Hall M.D.   On: 05/07/2024 14:06   IR US  Guide Vasc Access Right Result Date: 05/07/2024 INDICATION: ESRD requiring HD initiation EXAM: TUNNELED CENTRAL VENOUS HEMODIALYSIS CATHETER PLACEMENT WITH ULTRASOUND AND FLUOROSCOPIC GUIDANCE MEDICATIONS: Ancef  2 gm IV. The antibiotic was given in an appropriate time interval prior to skin puncture. ANESTHESIA/SEDATION: Local anesthetic and single agent sedation was employed during this procedure. A total of fentanyl  75 mcg was administered intravenously. The patient's level of consciousness and vital signs were monitored continuously by radiology nursing throughout the procedure under my direct supervision. FLUOROSCOPY: Radiation Exposure Index and estimated peak skin dose (PSD); Reference air kerma (RAK), 2.3 mGy. COMPLICATIONS: None immediate. PROCEDURE: Informed written consent was obtained from the patient and/or patient's representative after a discussion of the risks, benefits, and alternatives to treatment. Questions regarding the procedure were encouraged and answered. The RIGHT neck and chest were  prepped with chlorhexidine  in a sterile fashion, and a sterile drape was applied covering the operative field. Maximum barrier sterile technique with sterile gowns and gloves were used for the procedure. A timeout was performed prior to the initiation of the procedure. After creating a small venotomy incision, a micropuncture kit was utilized to access the internal jugular vein. Real-time ultrasound guidance was utilized for vascular access including the acquisition of a permanent ultrasound image documenting patency of the accessed vessel. The microwire was utilized to measure appropriate catheter length. A stiff Glidewire was advanced to the level of the IVC and the micropuncture sheath was exchanged for a peel-away sheath. A palindrome tunneled  hemodialysis catheter measuring 19 cm from tip to cuff was tunneled in a retrograde fashion from the anterior chest wall to the venotomy incision. The catheter was then placed through the peel-away sheath with tips ultimately positioned within the superior aspect of the right atrium. Final catheter positioning was confirmed and documented with a spot radiographic image. The catheter aspirates and flushes normally. The catheter was flushed with appropriate volume heparin  dwells. The catheter exit site was secured with a 0-Prolene retention suture. The venotomy incision was closed with Dermabond. Dressings were applied. The patient tolerated the procedure well without immediate post procedural complication. IMPRESSION: Successful placement of 19 cm tip to cuff tunneled hemodialysis catheter via the RIGHT internal jugular vein. The tip of the catheter is positioned at the superior cavo-atrial junction. The catheter is ready for immediate use. Thom Hall, MD [facility] Radiology Electronically Signed   By: Thom Hall M.D.   On: 05/07/2024 14:06   CT CHEST WO CONTRAST Result Date: 05/05/2024 EXAM: CT CHEST WITHOUT CONTRAST 05/05/2024 04:00:00 PM TECHNIQUE: CT of the chest was performed without the administration of intravenous contrast. Multiplanar reformatted images are provided for review. Automated exposure control, iterative reconstruction, and/or weight based adjustment of the mA/kV was utilized to reduce the radiation dose to as low as reasonably achievable. COMPARISON: None available. CLINICAL HISTORY: renal mass, staging. FINDINGS: MEDIASTINUM: \Moderate coronary atherosclerosis of the LAD and right coronary artery. Pericardium is unremarkable. The central airways are clear. Mild thoracic atherosclerosis. LYMPH NODES: No mediastinal, hilar or axillary lymphadenopathy. LUNGS AND PLEURA: 2 left lower lobe nodules measuring 2-3 mm (images 97 and 110),  technically indeterminate but likely benign. Trace bilateral pleural effusions. No focal consolidation or pulmonary edema. No pneumothorax. SOFT TISSUES/BONES: Moderate changes of the thoracic spine with mild dextroscoliosis. No acute abnormality of the soft tissues. UPPER ABDOMEN: Visualized upper abdomen is unchanged from recent CT abdomen and pelvis noting a large right renal mass. IMPRESSION: 1. Two 23 mm left lower lobe pulmonary nodules, technically indeterminate but likely benign. In the setting of known malignancy, consider follow-up CT chest in 3-6 months. 2. Trace bilateral pleural effusions. 3. Large right renal mass, unchanged from recent CT abdomen and pelvis. Electronically signed by: Pinkie Pebbles MD 05/05/2024 07:24 PM EDT RP Workstation: [id number]   CT ABDOMEN PELVIS WO CONTRAST Result Date: 05/04/2024 EXAM: CT ABDOMEN AND PELVIS WITHOUT CONTRAST 05/04/2024 07:16:37 PM TECHNIQUE: CT of the abdomen and pelvis was performed without the administration of intravenous contrast. Multiplanar reformatted images are provided for review. Automated exposure control, iterative reconstruction, and/or weight-based adjustment of the mA/kV was utilized to reduce the radiation dose to as low as reasonably achievable. COMPARISON: None available. CLINICAL HISTORY: Abdominal mass, palpable. Patient reports a hardened mass on the right side of the abdomen, denies pain, first noticed Sunday.  Normal bowel movements, last bowel movement this morning. FINDINGS: LOWER CHEST: No acute abnormality. LIVER: The liver is unremarkable. GALLBLADDER AND BILE DUCTS: Gallbladder is unremarkable. No biliary ductal dilatation. SPLEEN: No acute abnormality. PANCREAS: No acute abnormality. ADRENAL GLANDS: The adrenal glands are unremarkable. KIDNEYS, URETERS AND BLADDER: A 15.4 x 19.1 x 16.7 cm mass is seen involving the interpolar region of the right kidney, likely representing a primary renal cell carcinoma. Variable density is  likely related to areas of central necrosis. Faint Dystrophic calcification is seen throughout the mass. The mass demonstrates endophytic and exophytic extension with mass effect upon the adjacent gallbladder and ascending colon without evidence of direct invasion, as well as extension into the right renal pelvis without definite involvement of the right renal vein or proximal right ureter. No hydronephrosis of the right kidney is seen. The mass appears to extend beyond Gerota fascia laterally, where it abuts the peritoneal lining, with significant mass effect upon the inferior vena cava, which is slit-like in configuration. Multiple indeterminate exophytic cortical lesions are seen arising from the left kidney, measuring up to 15 mm, which are not well characterized on this noncontrast examination. No hydronephrosis on the left. No intrarenal or ureteral calculi. The bladder is partially obscured by artifact but is otherwise unremarkable. GI AND BOWEL: Appendix is normal. Stomach, small bowel, and large bowel are otherwise unremarkable. PERITONEUM AND RETROPERITONEUM: No ascites. No free air. VASCULATURE: The inferior vena cava is slit-like in configuration, best seen at image number 38, series number 2, without direct invasion of the structure. LYMPH NODES: No adjacent pathologic adenopathy is identified. REPRODUCTIVE ORGANS: No acute abnormality. BONES AND SOFT TISSUES: Left total hip arthroplasty has been performed. Osseous structures are otherwise age-appropriate. No lytic or blastic bone lesions. IMPRESSION: 1. Large right renal mass (15.4 x 19.1 x 16.7 cm) with variable density, likely representing primary renal cell carcinoma with central necrosis. The mass demonstrates endophytic and exophytic extension, mass effect on the gallbladder and ascending colon without direct invasion, and extension into the right renal pelvis without definite involvement of the right renal vein or proximal right ureter. The mass  also extends beyond Gerota fascia laterally, abutting the peritoneal lining, and causes significant mass effect on the inferior vena cava without direct invasion. No adjacent pathologic adenopathy or hydronephrosis. 2. Multiple indeterminate exophytic cortical lesions in the left kidney, measuring up to 15 mm, not well characterized on this noncontrast examination. Dedicated contrast-enhanced MRI examination is recommended for further evaluation. 3. Non-emergent CT examination of the chest is recommended for staging purposes. Electronically signed by: Dorethia Molt MD 05/04/2024 07:43 PM EDT RP Workstation: [id number]     Assessment and Plan:  Atrial fibrillation with RVR - rates initially in the 140s - started on heparin  gtt - started on cardizem gtt running at 7.5 mg/hr --> transitioning to 90 mg cardizem TID - remains on home 25 mg coreg  BID - also received targeted IV lopressor doses x 2 overnight - telemetry shows conversion to sinus bradycardia in the 50s early this morning - He remains in sinus rhythm in the 60s-70s with PVCs, no postconversion pause -Recommend transitioning Cardizem to 180 mg Cardizem CD with option to increase this dose as needed   Need for chronic anticoagulation - Currently on heparin  drip - Stable chronic anemia with hemoglobin in the 8-9 range, no signs of active bleeding -Stroke risk elevated for hypertension, age, and coronary atherosclerosis noted on CT chest - Should resection of his renal mass occurred this hospitalization,  recommend continuation on heparin  drip to be stopped prior to surgery - Suspect he would be a candidate for DOAC following resection   HTN - PTA: 25 mg coreg  BID, 0.3 mg clonidine  TID, 10 mg amlodipine , 30 mg imdur , 100 mg hydralazine  TID, irbesartan  150 mg  - continue 25 mg coreg  BID - imdur  increased to 60 mg - continue 100 mg hydralazine  TID - BP remains in the 150s systolic - holding ARB - titrating cardizem   CKD 5 -  nephrology on board to start HD   Chronic anemia - likely due to CKD - Hb at baseline 8-9 range - no active signs of bleeding, will monitor with heparin     Coronary atherosclerosis - Noted on CT chest and LAD and RCA - No chest pain - Elevated troponin in this admission likely due to demand mismatch in the setting of CKD and RVR - Will defer ischemic evaluation for now, depending on echocardiogram results will likely be able to proceed to resection   Risk Assessment/Risk Scores:    Included a point on CHA2DS2-VASc for CAD noted on CT chest.  CHA2DS2-VASc Score = 3   This indicates a 3.2% annual risk of stroke. The patient's score is based upon: CHF History: 0 HTN History: 1 Diabetes History: 0 Stroke History: 0 Vascular Disease History: 1 Age Score: 1 Gender Score: 0   For questions or updates, please contact [facility] Please consult [email or web address] for contact info under   Signed, Jon Nat Hails, PA  05/08/2024 10:35 AM

## 2024-05-08 NOTE — Progress Notes (Signed)
 PHARMACY - ANTICOAGULATION CONSULT NOTE  Pharmacy Consult for heparin  Indication: atrial fibrillation  Allergies  Allergen Reactions   Hydrocodone Other (See Comments)    Severe hiccups    Patient Measurements: Height: 5' 7 (170.2 cm) Weight: 74.2 kg (163 lb 9.3 oz) IBW/kg (Calculated) : 66.1 HEPARIN  DW (KG): 77.6  Vital Signs: Temp: 98.2 F (36.8 C) (09/28 0000) BP: 136/107 (09/28 0115) Pulse Rate: 114 (09/28 0100)  Labs: Recent Labs    05/06/24 0258 05/07/24 1223 05/08/24 0015  HGB 8.6*  --   --   HCT 26.4*  --   --   PLT 275  --   --   LABPROT 15.4*  --   --   INR 1.2  --   --   CREATININE 15.64* 16.76*  --   TROPONINIHS  --   --  266*    Estimated Creatinine Clearance: 4 mL/min (A) (by C-G formula based on SCr of 16.76 mg/dL (H)).   Medical History: Past Medical History:  Diagnosis Date   Arrhythmia 1991   patient  describes that he woke up surrounded by doctors saying that his heart had stopped while sleeping; denies having pain during this event; also denies any reccurrent issues after that episode; says that was when they told me i had high blood pressure    Arthritis    Chronic kidney disease    stage IV managed by Dr Arthurine [facility]    Headache    Hypertension    Umbilical hernia    Assessment: 43 yoM presented with abdominal discomfort found to be in afib RVR. Pharmacy consulted to dose heparin  for afib  -[place] heparin  for DVT ppx (Last dose 9/27 ~2200) -Hgb 8.6, plts 275 -No PTA anticoagulation -ESRD now starting HD  Goal of Therapy:  Heparin  level 0.3-0.7 units/ml Monitor platelets by anticoagulation protocol: Yes   Plan:  D/c [place] heparin   No bolus given low Hgb/[place] heparin  injection recently given  Start heparin  infusion at 1200 units/hr Check anti-Xa level in 8 hours and daily while on heparin  Continue to monitor H&H and platelets  Lynwood Poplar, PharmD, BCPS Clinical Pharmacist 05/08/2024 2:02 AM

## 2024-05-08 NOTE — Progress Notes (Signed)
 PHARMACY - ANTICOAGULATION CONSULT NOTE  Pharmacy Consult for heparin  Indication: atrial fibrillation  Labs: Recent Labs    05/06/24 0258 05/07/24 1223 05/08/24 0015 05/08/24 0359 05/08/24 1205 05/08/24 2220  HGB 8.6*  --   --  8.9*  --   --   HCT 26.4*  --   --  26.9*  --   --   PLT 275  --   --  280  --   --   LABPROT 15.4*  --   --   --   --   --   INR 1.2  --   --   --   --   --   HEPARINUNFRC  --   --   --   --  0.16* 0.25*  CREATININE 15.64* 16.76*  --   --   --   --   TROPONINIHS  --   --  266* 275*  --   --     Assessment: 68yo male subtherapeutic on heparin  after rate change; no infusion issues or signs of bleeding per RN.  Goal of Therapy:  Heparin  level 0.3-0.7 units/ml   Plan:  Increase heparin  infusion by 1-2 units/kg/hr to 1500 units/hr. Check level in 8 hours.   Marvetta Dauphin, PharmD, BCPS 05/09/2024 12:04 AM

## 2024-05-08 NOTE — Progress Notes (Signed)
 PROGRESS NOTE                                                                                                                                                                                                             Patient Demographics:    Marvin Garcia, is a 68 y.o. male, DOB - 07-Dec-1955, FMW:[id number]  Outpatient Primary MD for the patient is Patient, No Pcp Per    LOS - 3  Admit date - 05/04/2024    Chief Complaint  Patient presents with   Mass       Brief Narrative (HPI from H&P)    68 y.o. male with PMH of chronic kidney disease stage V, hypertension presented with abdominal discomfort and fullness to [facility] ED. He satarted noticing bulge on left abdomen since Sunday. Patient otherwise denies any nausea, vomiting, chest pain, shortness of breath, fever, chills, headache, focal weakness, numbness tingling, speech difficulties. He has not had recetn weight loss, or night sweats or chills.  The workup in the ER suggested a large right sided renal mass suspicious for RCC with possible mets to the lungs.   Subjective:   Patient in bed, appears comfortable, denies any headache, no fever, no chest pain or pressure, no shortness of breath , no abdominal pain. No focal weakness.   Assessment  & Plan :   Progressive CKD 5 vs AKI on CKD, Metabolic acidosis: Nephrology following, HD catheter was placed on 05/07/2024 by IR, HD was started on the same date.  Nephrology on board.     Large right-sided renal mass likely RCC: Urology has been consulted and is outpatient resection once patient is discharged and dialysis has been initiated, will follow-up with Dr. Patrcia.   New onset paroxysmal atrial fibrillation with RVR.  Noticed night of 05/07/2024, [place] vas 2 score greater than 4.  Currently on Cardizem drip, will transition to oral Cardizem and titrate off IV Cardizem drip, check TSH and echocardiogram, would be challenging to  place him on anticoagulation due to his large renal malignancy and impending renal resection in the next few weeks, for now heparin  drip.  Will involve cardiology.    Anemia of renal disease: Monitor hemoglobin transfuse less than 7 g.   Hypertension: Currently placed on combination of Coreg , hydralazine , Imdur  and Cardizem.  Monitor and adjust.  Lung nodules.  Repeat  CT in 3 months follow-up with pulmonary postdischarge.      Condition -   Guarded  Family Communication  : Wife [name]  [phone]  updated in detail on 05/06/2024  Code Status :  Full  Consults  :  Renal, Urology, cardiology  PUD Prophylaxis :    Procedures  :     ECHO  Right IJ HD catheter placed by IR on 05/07/2024.    CT Chest - 1. Two 23 mm left lower lobe pulmonary nodules, technically indeterminate but likely benign. In the setting of known malignancy, consider follow-up CT chest in 3-6 months. 2. Trace bilateral pleural effusions. 3. Large right renal mass, unchanged from recent CT abdomen and pelvis.  CT Abd pelvis -   1. Large right renal mass (15.4 x 19.1 x 16.7 cm) with variable density, likely representing primary renal cell carcinoma with central necrosis. The mass demonstrates endophytic and exophytic extension, mass effect on the gallbladder and ascending colon without direct invasion, and extension into the right renal pelvis without definite involvement of the right renal vein or proximal right ureter. The mass also extends beyond Gerota fascia laterally, abutting the peritoneal lining, and causes significant mass effect on the inferior vena cava without direct invasion. No adjacent pathologic adenopathy or hydronephrosis. 2. Multiple indeterminate exophytic cortical lesions in the left kidney, measuring up to 15 mm, not well characterized on this noncontrast examination. Dedicated contrast-enhanced MRI examination is recommended for further evaluation. 3. Non-emergent CT examination of the chest is  recommended for staging purposes      Disposition Plan  :    Status is: Inpatient   DVT Prophylaxis  :    SCDs Start: 05/05/24 1524    Lab Results  Component Value Date   PLT 280 05/08/2024    Diet :  Diet Order             Diet renal with fluid restriction Fluid restriction: 1500 mL Fluid; Room service appropriate? Yes; Fluid consistency: Thin  Diet effective now                    Inpatient Medications  Scheduled Meds:  carvedilol   12.5 mg Oral BID WC   Chlorhexidine  Gluconate Cloth  6 each Topical Daily   cloNIDine   0.2 mg Oral TID   hydrALAZINE   100 mg Oral TID   isosorbide  mononitrate  60 mg Oral Daily   Continuous Infusions:  diltiazem (CARDIZEM) infusion 7.5 mg/hr (05/08/24 0700)   heparin  1,200 Units/hr (05/08/24 0700)   PRN Meds:.acetaminophen  **OR** acetaminophen , hydrALAZINE , metoprolol tartrate, ondansetron  **OR** ondansetron  (ZOFRAN ) IV, senna-docusate  Antibiotics  :    Anti-infectives (From admission, onward)    None         Objective:   Vitals:   05/08/24 0530 05/08/24 0600 05/08/24 0630 05/08/24 0700  BP: (!) 155/80 (!) 137/98 (!) 153/83 (!) 146/91  Pulse: 60 65 60 69  Resp: (!) 21 11 20 19   Temp:      TempSrc:      SpO2: 95% 98% 95% 96%  Weight:      Height:        Wt Readings from Last 3 Encounters:  05/07/24 74.2 kg  04/07/23 87.1 kg  03/24/23 87.1 kg     Intake/Output Summary (Last 24 hours) at 05/08/2024 0739 Last data filed at 05/08/2024 0736 Gross per 24 hour  Intake 115.73 ml  Output 850 ml  Net -734.27 ml     Physical Exam  Awake Alert, No new F.N deficits, Normal affect, right IJ HD catheter in place Vivian.AT,PERRAL Supple Neck, No JVD,   Symmetrical Chest wall movement, Good air movement bilaterally, CTAB RRR,No Gallops,Rubs or new Murmurs,  +ve B.Sounds, Abd Soft, No tenderness,   No Cyanosis, Clubbing or edema        Data Review:    Recent Labs  Lab 05/03/24 1039 05/04/24 1552  05/06/24 0258 05/08/24 0359  WBC  --  6.0 7.0 8.4  HGB 9.0* 8.1* 8.6* 8.9*  HCT  --  25.2* 26.4* 26.9*  PLT  --  260 275 280  MCV  --  75.2* 74.4* 73.9*  MCH  --  24.2* 24.2* 24.5*  MCHC  --  32.1 32.6 33.1  RDW  --  18.4* 18.8* 19.1*  LYMPHSABS  --  0.8  --   --   MONOABS  --  0.7  --   --   EOSABS  --  0.1  --   --   BASOSABS  --  0.0  --   --     Recent Labs  Lab 05/04/24 1552 05/06/24 0258 05/07/24 1223 05/08/24 0015  NA 139 137 138  --   K 4.4 3.9 3.8  --   CL 99 102 103  --   CO2 16* 16* 16*  --   ANIONGAP 24* 19* 19*  --   GLUCOSE 109* 83 122*  --   BUN 149* 157* 158*  --   CREATININE 14.20* 15.64* 16.76*  --   AST 15  --   --   --   ALT 6  --   --   --   ALKPHOS 84  --   --   --   BILITOT 0.5  --   --   --   ALBUMIN 4.4  --  3.6  --   INR  --  1.2  --   --   MG  --   --   --  2.2  PHOS  --   --  7.4* 5.0*  CALCIUM  9.7 9.1 9.5  --       Recent Labs  Lab 05/04/24 1552 05/06/24 0258 05/07/24 1223 05/08/24 0015  INR  --  1.2  --   --   MG  --   --   --  2.2  CALCIUM  9.7 9.1 9.5  --     --------------------------------------------------------------------------------------------------------------- No results found for: CHOL, HDL, LDLCALC, LDLDIRECT, TRIG, CHOLHDL  No results found for: HGBA1C No results for input(s): TSH, T4TOTAL, FREET4, T3FREE, THYROIDAB in the last 72 hours. No results for input(s): VITAMINB12, FOLATE, FERRITIN, TIBC, IRON, RETICCTPCT in the last 72 hours. ------------------------------------------------------------------------------------------------------------------ Cardiac Enzymes No results for input(s): CKMB, TROPONINI, MYOGLOBIN in the last 168 hours.  Invalid input(s): CK  Micro Results No results found for this or any previous visit (from the past 240 hours).  Radiology Report IR Fluoro Guide CV Line Right Result Date: 05/07/2024 INDICATION: ESRD requiring HD initiation  EXAM: TUNNELED CENTRAL VENOUS HEMODIALYSIS CATHETER PLACEMENT WITH ULTRASOUND AND FLUOROSCOPIC GUIDANCE MEDICATIONS: Ancef  2 gm IV. The antibiotic was given in an appropriate time interval prior to skin puncture. ANESTHESIA/SEDATION: Local anesthetic and single agent sedation was employed during this procedure. A total of fentanyl  75 mcg was administered intravenously. The patient's level of consciousness and vital signs were monitored continuously by radiology nursing throughout the procedure under my direct supervision. FLUOROSCOPY: Radiation Exposure Index and estimated peak skin dose (PSD); Reference air kerma (  RAK), 2.3 mGy. COMPLICATIONS: None immediate. PROCEDURE: Informed written consent was obtained from the patient and/or patient's representative after a discussion of the risks, benefits, and alternatives to treatment. Questions regarding the procedure were encouraged and answered. The RIGHT neck and chest were prepped with chlorhexidine  in a sterile fashion, and a sterile drape was applied covering the operative field. Maximum barrier sterile technique with sterile gowns and gloves were used for the procedure. A timeout was performed prior to the initiation of the procedure. After creating a small venotomy incision, a micropuncture kit was utilized to access the internal jugular vein. Real-time ultrasound guidance was utilized for vascular access including the acquisition of a permanent ultrasound image documenting patency of the accessed vessel. The microwire was utilized to measure appropriate catheter length. A stiff Glidewire was advanced to the level of the IVC and the micropuncture sheath was exchanged for a peel-away sheath. A palindrome tunneled hemodialysis catheter measuring 19 cm from tip to cuff was tunneled in a retrograde fashion from the anterior chest wall to the venotomy incision. The catheter was then placed through the peel-away sheath with tips ultimately positioned within the superior  aspect of the right atrium. Final catheter positioning was confirmed and documented with a spot radiographic image. The catheter aspirates and flushes normally. The catheter was flushed with appropriate volume heparin  dwells. The catheter exit site was secured with a 0-Prolene retention suture. The venotomy incision was closed with Dermabond. Dressings were applied. The patient tolerated the procedure well without immediate post procedural complication. IMPRESSION: Successful placement of 19 cm tip to cuff tunneled hemodialysis catheter via the RIGHT internal jugular vein. The tip of the catheter is positioned at the superior cavo-atrial junction. The catheter is ready for immediate use. Thom Hall, MD [facility] Radiology Electronically Signed   By: Thom Hall M.D.   On: 05/07/2024 14:06   IR US  Guide Vasc Access Right Result Date: 05/07/2024 INDICATION: ESRD requiring HD initiation EXAM: TUNNELED CENTRAL VENOUS HEMODIALYSIS CATHETER PLACEMENT WITH ULTRASOUND AND FLUOROSCOPIC GUIDANCE MEDICATIONS: Ancef  2 gm IV. The antibiotic was given in an appropriate time interval prior to skin puncture. ANESTHESIA/SEDATION: Local anesthetic and single agent sedation was employed during this procedure. A total of fentanyl  75 mcg was administered intravenously. The patient's level of consciousness and vital signs were monitored continuously by radiology nursing throughout the procedure under my direct supervision. FLUOROSCOPY: Radiation Exposure Index and estimated peak skin dose (PSD); Reference air kerma (RAK), 2.3 mGy. COMPLICATIONS: None immediate. PROCEDURE: Informed written consent was obtained from the patient and/or patient's representative after a discussion of the risks, benefits, and alternatives to treatment. Questions regarding the procedure were encouraged and answered. The RIGHT neck and chest were prepped with chlorhexidine  in a sterile fashion, and a  sterile drape was applied covering the operative field. Maximum barrier sterile technique with sterile gowns and gloves were used for the procedure. A timeout was performed prior to the initiation of the procedure. After creating a small venotomy incision, a micropuncture kit was utilized to access the internal jugular vein. Real-time ultrasound guidance was utilized for vascular access including the acquisition of a permanent ultrasound image documenting patency of the accessed vessel. The microwire was utilized to measure appropriate catheter length. A stiff Glidewire was advanced to the level of the IVC and the micropuncture sheath was exchanged for a peel-away sheath. A palindrome tunneled hemodialysis catheter measuring 19 cm from tip to cuff was tunneled in a retrograde fashion from the anterior  chest wall to the venotomy incision. The catheter was then placed through the peel-away sheath with tips ultimately positioned within the superior aspect of the right atrium. Final catheter positioning was confirmed and documented with a spot radiographic image. The catheter aspirates and flushes normally. The catheter was flushed with appropriate volume heparin  dwells. The catheter exit site was secured with a 0-Prolene retention suture. The venotomy incision was closed with Dermabond. Dressings were applied. The patient tolerated the procedure well without immediate post procedural complication. IMPRESSION: Successful placement of 19 cm tip to cuff tunneled hemodialysis catheter via the RIGHT internal jugular vein. The tip of the catheter is positioned at the superior cavo-atrial junction. The catheter is ready for immediate use. Thom Hall, MD [facility] Radiology Electronically Signed   By: Thom Hall M.D.   On: 05/07/2024 14:06     Signature  -   Lavada Stank M.D on 05/08/2024 at 7:39 AM   -  To page go to [email or web address]

## 2024-05-08 NOTE — Progress Notes (Signed)
 Called - Afib rvr to 140's , no prior hx of Brief hx; Patient admit for renal mass, ? RCC after noting abdominal discomfort and fullness. EKG afib rvr  Patient is asx  Labs ordered  Metoprolol iv ordered.  Echo ordered.  Will place cardiology consult as well.  Primary attn to follow  Primary rn to notify on call if interventions above are not effective.

## 2024-05-08 NOTE — Progress Notes (Signed)
 Admit: 05/04/2024 LOS: 3  17M progressive CKD5 admitted with large R renal mass concering for RCC  Subjective:  Tunneled HD catheter placed yesterday by IR, appreciate Received first HD treatment today, tolerated well A-fib with RVR overnight requiring diltiazem drip and placed on heparin  Cardiology to see today  09/27 0701 - 09/28 0700 In: 115.7 [I.V.:115.7] Out: 600 [Urine:600]  Filed Weights   05/04/24 1547 05/07/24 1746  Weight: 77.6 kg 74.2 kg    Scheduled Meds:  carvedilol   25 mg Oral BID WC   Chlorhexidine  Gluconate Cloth  6 each Topical Daily   diltiazem  180 mg Oral Daily   hydrALAZINE   100 mg Oral TID   isosorbide  mononitrate  60 mg Oral Daily   Continuous Infusions:  heparin  1,200 Units/hr (05/08/24 0700)   PRN Meds:.acetaminophen  **OR** acetaminophen , hydrALAZINE , metoprolol tartrate, ondansetron  **OR** ondansetron  (ZOFRAN ) IV, senna-docusate  Current Labs: reviewed   Physical Exam:  Blood pressure (!) 155/95, pulse 67, temperature 98.3 F (36.8 C), temperature source Oral, resp. rate 17, height 5' 7 (1.702 m), weight 74.2 kg, SpO2 96%. Gen: comfortable, awake and alert  Eyes:  EOMI ENT: MMM Neck: supple, no JVD CV: RRR, II/VI SEM, no rub Abd:  soft, nontender Lungs: clear GU: no foley Extr: no edema Neuro:no asterixis, conversant, attentive Skin: no rashes or lesions, right IJ tunneled HD catheter bandaged, clean  A Longstanding and progressive CKD 5 with significant azotemia followed by Dr. Gearline, now ESRD Large right renal mass concerning for extensive RCC, urology thinks potentially curable with surgery AGMA in the setting of #1, monitor Anemia: Holding ESA given #2.  TSAT 13%.  Monitor. Hypertension, stable, monitor on current regimen A-fib with RVR overnight 05/08/2024, back in sinus rhythm this morning on heparin  diltiazem and carvedilol   P HD #2, 05/09/24 Notify renal navigator of new ESRD status 9/29 but surgical plan needs to be defined  first  eventually to need AVF evaluation but would let urological surgical plan be defined first Medication Issues; Preferred narcotic agents for pain control are hydromorphone , fentanyl , and methadone. Morphine should not be used.  Baclofen should be avoided Avoid oral sodium phosphate  and magnesium  citrate based laxatives / bowel preps   Case and plan discussed with Dr. Dennise [facility]   Bernardino Gasman MD 05/08/2024, 10:53 AM  Recent Labs  Lab 05/04/24 1552 05/06/24 0258 05/07/24 1223 05/08/24 0015  NA 139 137 138  --   K 4.4 3.9 3.8  --   CL 99 102 103  --   CO2 16* 16* 16*  --   GLUCOSE 109* 83 122*  --   BUN 149* 157* 158*  --   CREATININE 14.20* 15.64* 16.76*  --   CALCIUM  9.7 9.1 9.5  --   PHOS  --   --  7.4* 5.0*   Recent Labs  Lab 05/04/24 1552 05/06/24 0258 05/08/24 0359  WBC 6.0 7.0 8.4  NEUTROABS 4.2  --   --   HGB 8.1* 8.6* 8.9*  HCT 25.2* 26.4* 26.9*  MCV 75.2* 74.4* 73.9*  PLT 260 275 280

## 2024-05-08 NOTE — Significant Event (Signed)
 05/07/24 - Tele notified primary RN of rhythm change to afib. STAT EKG obtained. Confirmed afib RVR. On-call MD notified. RRT RN notified. Consulting civil engineer notified. On-call MD ordered 5mg  IV metoprolol PRN q5 min. 1st dose given without significant change. Second dose given. RN to follow up with on call RN if interventions are not effective.

## 2024-05-09 ENCOUNTER — Encounter (HOSPITAL_COMMUNITY): Payer: Self-pay

## 2024-05-09 DIAGNOSIS — I10 Essential (primary) hypertension: Secondary | ICD-10-CM | POA: Diagnosis not present

## 2024-05-09 DIAGNOSIS — N185 Chronic kidney disease, stage 5: Secondary | ICD-10-CM

## 2024-05-09 DIAGNOSIS — N179 Acute kidney failure, unspecified: Secondary | ICD-10-CM | POA: Diagnosis not present

## 2024-05-09 DIAGNOSIS — I480 Paroxysmal atrial fibrillation: Secondary | ICD-10-CM | POA: Diagnosis not present

## 2024-05-09 LAB — RENAL FUNCTION PANEL
Albumin: 3.5 g/dL (ref 3.5–5.0)
Anion gap: 15 (ref 5–15)
BUN: 69 mg/dL — ABNORMAL HIGH (ref 8–23)
CO2: 21 mmol/L — ABNORMAL LOW (ref 22–32)
Calcium: 9.3 mg/dL (ref 8.9–10.3)
Chloride: 98 mmol/L (ref 98–111)
Creatinine, Ser: 9.69 mg/dL — ABNORMAL HIGH (ref 0.61–1.24)
GFR, Estimated: 5 mL/min — ABNORMAL LOW (ref >60)
Glucose, Bld: 107 mg/dL — ABNORMAL HIGH (ref 70–99)
Phosphorus: 4.7 mg/dL — ABNORMAL HIGH (ref 2.5–4.6)
Potassium: 3.5 mmol/L (ref 3.5–5.1)
Sodium: 134 mmol/L — ABNORMAL LOW (ref 135–145)

## 2024-05-09 LAB — CBC
HCT: 28.4 % — ABNORMAL LOW (ref 39.0–52.0)
Hemoglobin: 9.2 g/dL — ABNORMAL LOW (ref 13.0–17.0)
MCH: 24.1 pg — ABNORMAL LOW (ref 26.0–34.0)
MCHC: 32.4 g/dL (ref 30.0–36.0)
MCV: 74.5 fL — ABNORMAL LOW (ref 80.0–100.0)
Platelets: 303 K/uL (ref 150–400)
RBC: 3.81 MIL/uL — ABNORMAL LOW (ref 4.22–5.81)
RDW: 19.9 % — ABNORMAL HIGH (ref 11.5–15.5)
WBC: 8.6 K/uL (ref 4.0–10.5)
nRBC: 0.5 % — ABNORMAL HIGH (ref 0.0–0.2)

## 2024-05-09 LAB — MAGNESIUM: Magnesium: 1.9 mg/dL (ref 1.7–2.4)

## 2024-05-09 MED ORDER — DILTIAZEM HCL ER COATED BEADS 120 MG PO CP24: 240.0000 mg | ORAL_CAPSULE | Freq: Every day | ORAL | Status: DC

## 2024-05-09 MED ORDER — CHLORHEXIDINE GLUCONATE CLOTH 2 % EX PADS
6.0000 | MEDICATED_PAD | Freq: Every day | CUTANEOUS | Status: DC
  Administered 2024-05-10 – 2024-05-11 (×2): 6 via TOPICAL

## 2024-05-09 MED ORDER — BACLOFEN 5 MG HALF TABLET
5.0000 mg | ORAL_TABLET | Freq: Three times a day (TID) | ORAL | Status: DC | PRN
  Administered 2024-05-09: 5 mg via ORAL
  Filled 2024-05-09: qty 1

## 2024-05-09 MED ORDER — HEPARIN SODIUM (PORCINE) 5000 UNIT/ML IJ SOLN
5000.0000 [IU] | Freq: Three times a day (TID) | INTRAMUSCULAR | Status: DC
  Administered 2024-05-09 – 2024-05-11 (×6): 5000 [IU] via SUBCUTANEOUS
  Filled 2024-05-09 (×6): qty 1

## 2024-05-09 MED ORDER — DILTIAZEM HCL 25 MG/5ML IV SOLN
10.0000 mg | Freq: Once | INTRAVENOUS | Status: AC
  Administered 2024-05-09: 10 mg via INTRAVENOUS
  Filled 2024-05-09: qty 5

## 2024-05-09 MED ORDER — DILTIAZEM HCL 25 MG/5ML IV SOLN
10.0000 mg | Freq: Four times a day (QID) | INTRAVENOUS | Status: DC | PRN
  Administered 2024-05-09 (×2): 10 mg via INTRAVENOUS
  Filled 2024-05-09: qty 5

## 2024-05-09 MED ORDER — DILTIAZEM HCL 60 MG PO TABS
90.0000 mg | ORAL_TABLET | Freq: Four times a day (QID) | ORAL | Status: DC
  Administered 2024-05-10 – 2024-05-11 (×5): 90 mg via ORAL
  Filled 2024-05-09 (×5): qty 1

## 2024-05-09 MED ORDER — SODIUM CHLORIDE 0.9 % IV SOLN
25.0000 mg | Freq: Three times a day (TID) | INTRAVENOUS | Status: DC | PRN
  Administered 2024-05-09 – 2024-05-10 (×3): 25 mg via INTRAVENOUS
  Filled 2024-05-09 (×3): qty 1

## 2024-05-09 MED ORDER — HEPARIN SODIUM (PORCINE) 1000 UNIT/ML IJ SOLN
INTRAMUSCULAR | Status: AC
  Filled 2024-05-09: qty 4

## 2024-05-09 MED ORDER — DILTIAZEM HCL-DEXTROSE 125-5 MG/125ML-% IV SOLN (PREMIX)
5.0000 mg/h | INTRAVENOUS | Status: DC
  Administered 2024-05-09: 5 mg/h via INTRAVENOUS
  Administered 2024-05-10: 15 mg/h via INTRAVENOUS
  Filled 2024-05-09 (×2): qty 125

## 2024-05-09 MED ORDER — ANTICOAGULANT SODIUM CITRATE 4% (200MG/5ML) IV SOLN: 5.0000 mL | Status: DC | PRN

## 2024-05-09 NOTE — Progress Notes (Addendum)
 1200PM: Patient came back from dialysis.   1202PM: Cardiac- Afib RVR consistent HR's 130-140's. Metoprolol IV given 3x with no effect. Dr. Dennise informed. Added Diltiazem 10mg  IV-given. HR went down below 100's but after , HR went back up to 130-140s.  Another Diltiazem 10mg  IV given-HR on 120-130's.   Patient denies SOB or any chest pain.SBP w/in 130-140's.

## 2024-05-09 NOTE — Plan of Care (Signed)

## 2024-05-09 NOTE — Progress Notes (Signed)
 Requested to see pt for out-pt HD needs at d/c. Met with pt at bedside while pt receiving HD. Introduced self and explained role. Discussed out-pt HD options. Pt prefers [facility] due to clinic location and pt's [facility] nephrologist sees pts at that clinic. Referral submitted to [facility] admissions for review. Pt states that son can assist with transportation to/from HD appts if pt unable to drive. Will assist as needed.   Randine Mungo Dialysis Navigator [phone]

## 2024-05-09 NOTE — Progress Notes (Deleted)
  Progress Note  Patient Name: Marvin Garcia Date of Encounter: 05/09/2024 [facility] Cardiologist: Redell Shallow, MD   Interval Summary   Patient seen while in dialysis  Reviewed telemetry, appears to be maintaining sinus rhythm Denies any acute cardiac complaints Main concerns are hiccups, managed by primary   Vital Signs Vitals:   05/09/24 0734 05/09/24 0742 05/09/24 0830 05/09/24 0839  BP: (!) 147/91  (!) 176/94 (!) 170/85  Pulse: 71 67 70 69  Resp: 13 15 19 15   Temp:   98 F (36.7 C)   TempSrc:      SpO2: 94% 95% 96% 97%  Weight:   75.2 kg   Height:        Intake/Output Summary (Last 24 hours) at 05/09/2024 0855 Last data filed at 05/09/2024 9386 Gross per 24 hour  Intake 336.63 ml  Output 450 ml  Net -113.37 ml      05/09/2024    8:30 AM 05/07/2024    5:46 PM 05/04/2024    3:47 PM  Last 3 Weights  Weight (lbs) 165 lb 12.6 oz 163 lb 9.3 oz 171 lb  Weight (kg) 75.2 kg 74.2 kg 77.565 kg     Telemetry/ECG  Sinus rhythm, HR 60-70s, PVCs - Personally Reviewed  Physical Exam  GEN: No acute distress.   Neck: No JVD Cardiac: RRR, soft systolic murmur.  Respiratory: Clear to auscultation bilaterally. GI: Soft, nontender, non-distended  MS: No edema  Assessment & Plan   Paroxysmal A-Fib with RVR No prior history of A-Fib HR as high as 140s  Started on IV heparin , diltiazem  Converted to NSR with PVCs No post-conversion pause noted  TSH normal  Echo: LVEF 60-65%, no RWMA, moderate LVH, normal RV function, moderate MAC, mild AS, normal IVC, dilation of aortic root at 39 mm Continue carvedilol  25 mg BID Continue diltiazem 180 mg daily  Not currently on DOAC due to increased risk of bleeding/anemia with large right renal mass -- considering adding DOAC once hemostasis achieved post nephrectomy in 2-3 weeks   Hypertension BP variable Patient now on dialysis  Continue with medications as above Dialysis per nephrology   Per primary  Acute on  chronic kidney disease Renal mass, suspected RCC Upcoming nephrectomy  Lung nodules Anemia of chronic disease    For questions or updates, please contact [facility] Please consult [email or web address] for contact info under         Signed, Waddell DELENA Donath, PA-C

## 2024-05-09 NOTE — Progress Notes (Signed)
 PROGRESS NOTE                                                                                                                                                                                                             Marvin Garcia Demographics:    Marvin Garcia, is a 68 y.o. male, DOB - [birth year], FMW:[id number]  Outpatient Primary MD for the Marvin Garcia is Marvin Garcia, No Pcp Per    LOS - 4  Admit date - 05/04/2024    Chief Complaint  Marvin Garcia presents with   Mass       Brief Narrative (HPI from H&P)    68 y.o. male with PMH of chronic kidney disease stage V, hypertension presented with abdominal discomfort and fullness to [facility] ED. He satarted noticing bulge on left abdomen since Sunday. Marvin Garcia otherwise denies any nausea, vomiting, chest pain, shortness of breath, fever, chills, headache, focal weakness, numbness tingling, speech difficulties. He has not had recetn weight loss, or night sweats or chills.  The workup in the ER suggested a large right sided renal mass suspicious for RCC with possible mets to the lungs.   Subjective:   Marvin Garcia in bed, appears comfortable, denies any headache, no fever, no chest pain or pressure, no shortness of breath , no abdominal pain. No focal weakness.  Having a lot of hiccups since last night.   Assessment  & Plan :   Progressive CKD 5 vs AKI on CKD, Metabolic acidosis: Nephrology following, HD catheter was placed on 05/07/2024 by IR, HD was started on the same date.  Nephrology on board.     Large right-sided renal mass likely RCC: Urology has been consulted and is outpatient resection once Marvin Garcia is discharged and dialysis has been initiated, will follow-up with Dr. Patrcia.   New onset paroxysmal atrial fibrillation with RVR.  Noticed night of 05/07/2024, [place] vas 2 score 3.  Seen by cardiology on oral Cardizem and Coreg , anticoagulation after nephrectomy per cardiology, echo stable TSH stable,  he has converted to sinus.  Anemia of renal disease: Monitor hemoglobin transfuse less than 7 g.   Hypertension: Currently placed on combination of Coreg , hydralazine , Imdur  and Cardizem.  Monitor and adjust.  Incidental finding of mild aortic root dilation on echo.  Follow-up with cardiology and PCP.  Beta-blocker if blood pressure tolerates.    New onset hiccups.  Baclofen and Thorazine  for now and monitor.    Lung nodules.  Repeat CT in 3 months follow-up with pulmonary postdischarge.      Condition -   Guarded  Family Communication  : Wife Ms. Marvin Garcia  [phone]  updated in detail on 05/06/2024  Code Status :  Full  Consults  :  Renal, Urology, cardiology  PUD Prophylaxis :    Procedures  :     ECHO - 1. Left ventricular ejection fraction, by estimation, is 60 to 65%. The left ventricle has normal function. The left ventricle has no regional wall motion abnormalities. There is moderate concentric left ventricular hypertrophy. Left ventricular diastolic parameters were normal.  2. Right ventricular systolic function is normal. The right ventricular size is normal. Tricuspid regurgitation signal is inadequate for assessing PA pressure.  3. The mitral valve is degenerative. No evidence of mitral valve regurgitation. No evidence of mitral stenosis. Moderate mitral annular calcification.  4. The aortic valve is calcified. There is moderate calcification of the aortic valve. There is moderate thickening of the aortic valve. Aortic valve regurgitation is not visualized. Mild aortic valve stenosis. Aortic valve area, by VTI measures 1.55 cm. Aortic valve mean gradient measures 7.5 mmHg. Aortic valve Vmax measures 1.99 m/s.  5. The inferior vena cava is normal in size with greater than 50% respiratory variability, suggesting right atrial pressure of 3 mmHg.  6. Aortic dilatation noted. There is mild dilatation of the aortic root, measuring 39 mm.  Right IJ HD catheter placed by IR on  05/07/2024.    CT Chest - 1. Two 23 mm left lower lobe pulmonary nodules, technically indeterminate but likely benign. In the setting of known malignancy, consider follow-up CT chest in 3-6 months. 2. Trace bilateral pleural effusions. 3. Large right renal mass, unchanged from recent CT abdomen and pelvis.  CT Abd pelvis -   1. Large right renal mass (15.4 x 19.1 x 16.7 cm) with variable density, likely representing primary renal cell carcinoma with central necrosis. The mass demonstrates endophytic and exophytic extension, mass effect on the gallbladder and ascending colon without direct invasion, and extension into the right renal pelvis without definite involvement of the right renal vein or proximal right ureter. The mass also extends beyond Gerota fascia laterally, abutting the peritoneal lining, and causes significant mass effect on the inferior vena cava without direct invasion. No adjacent pathologic adenopathy or hydronephrosis. 2. Multiple indeterminate exophytic cortical lesions in the left kidney, measuring up to 15 mm, not well characterized on this noncontrast examination. Dedicated contrast-enhanced MRI examination is recommended for further evaluation. 3. Non-emergent CT examination of the chest is recommended for staging purposes      Disposition Plan  :    Status is: Inpatient   DVT Prophylaxis  :    heparin  injection 5,000 Units Start: 05/09/24 1400 SCDs Start: 05/05/24 1524    Lab Results  Component Value Date   PLT 280 05/08/2024    Diet :  Diet Order             Diet renal with fluid restriction Fluid restriction: 1500 mL Fluid; Room service appropriate? Yes; Fluid consistency: Thin  Diet effective now                    Inpatient Medications  Scheduled Meds:  carvedilol   25 mg Oral BID WC   Chlorhexidine  Gluconate Cloth  6 each Topical Daily   diltiazem  180 mg Oral Daily   heparin  injection (  subcutaneous)  5,000 Units Subcutaneous Q8H   hydrALAZINE    100 mg Oral TID   isosorbide  mononitrate  60 mg Oral Daily   Continuous Infusions:  chlorproMAZINE  (THORAZINE ) 25 mg in sodium chloride  0.9 % 25 mL IVPB     PRN Meds:.acetaminophen  **OR** acetaminophen , baclofen, chlorproMAZINE  (THORAZINE ) 25 mg in sodium chloride  0.9 % 25 mL IVPB, hydrALAZINE , metoprolol tartrate, ondansetron  **OR** ondansetron  (ZOFRAN ) IV, senna-docusate  Antibiotics  :    Anti-infectives (From admission, onward)    None         Objective:   Vitals:   05/08/24 2135 05/09/24 0000 05/09/24 0734 05/09/24 0742  BP: (!) 165/96 (!) 166/96 (!) 147/91   Pulse:  73 71 67  Resp:  17 13 15   Temp:  98.7 F (37.1 C)    TempSrc:  Oral    SpO2:  95% 94% 95%  Weight:      Height:        Wt Readings from Last 3 Encounters:  05/07/24 74.2 kg  04/07/23 87.1 kg  03/24/23 87.1 kg     Intake/Output Summary (Last 24 hours) at 05/09/2024 0829 Last data filed at 05/09/2024 9386 Gross per 24 hour  Intake 336.63 ml  Output 450 ml  Net -113.37 ml     Physical Exam  Awake Alert, No new F.N deficits, Normal affect, right IJ HD catheter in place [place].AT,PERRAL Supple Neck, No JVD,   Symmetrical Chest wall movement, Good air movement bilaterally, CTAB RRR,No Gallops,Rubs or new Murmurs,  +ve B.Sounds, Abd Soft, No tenderness,   No Cyanosis, Clubbing or edema        Data Review:    Recent Labs  Lab 05/03/24 1039 05/04/24 1552 05/06/24 0258 05/08/24 0359  WBC  --  6.0 7.0 8.4  HGB 9.0* 8.1* 8.6* 8.9*  HCT  --  25.2* 26.4* 26.9*  PLT  --  260 275 280  MCV  --  75.2* 74.4* 73.9*  MCH  --  24.2* 24.2* 24.5*  MCHC  --  32.1 32.6 33.1  RDW  --  18.4* 18.8* 19.1*  LYMPHSABS  --  0.8  --   --   MONOABS  --  0.7  --   --   EOSABS  --  0.1  --   --   BASOSABS  --  0.0  --   --     Recent Labs  Lab 05/04/24 1552 05/06/24 0258 05/07/24 1223 05/08/24 0015  NA 139 137 138  --   K 4.4 3.9 3.8  --   CL 99 102 103  --   CO2 16* 16* 16*  --   ANIONGAP 24* 19*  19*  --   GLUCOSE 109* 83 122*  --   BUN 149* 157* 158*  --   CREATININE 14.20* 15.64* 16.76*  --   AST 15  --   --   --   ALT 6  --   --   --   ALKPHOS 84  --   --   --   BILITOT 0.5  --   --   --   ALBUMIN 4.4  --  3.6  --   INR  --  1.2  --   --   TSH  --   --   --  2.816  MG  --   --   --  2.2  PHOS  --   --  7.4* 5.0*  CALCIUM  9.7 9.1 9.5  --  Recent Labs  Lab 05/04/24 1552 05/06/24 0258 05/07/24 1223 05/08/24 0015  INR  --  1.2  --   --   TSH  --   --   --  2.816  MG  --   --   --  2.2  CALCIUM  9.7 9.1 9.5  --     --------------------------------------------------------------------------------------------------------------- No results found for: CHOL, HDL, LDLCALC, LDLDIRECT, TRIG, CHOLHDL  No results found for: HGBA1C Recent Labs    05/08/24 0015  TSH 2.816  FREET4 0.86   No results for input(s): VITAMINB12, FOLATE, FERRITIN, TIBC, IRON, RETICCTPCT in the last 72 hours. ------------------------------------------------------------------------------------------------------------------ Cardiac Enzymes No results for input(s): CKMB, TROPONINI, MYOGLOBIN in the last 168 hours.  Invalid input(s): CK  Micro Results Recent Results (from the past 240 hours)  MRSA Next Gen by PCR, Nasal     Status: None   Collection Time: 05/08/24  7:41 AM   Specimen: Nasal Mucosa; Nasal Swab  Result Value Ref Range Status   MRSA by PCR Next Gen NOT DETECTED NOT DETECTED Final    Comment: (NOTE) The GeneXpert MRSA Assay (FDA approved for NASAL specimens only), is one component of a comprehensive MRSA colonization surveillance program. It is not intended to diagnose MRSA infection nor to guide or monitor treatment for MRSA infections. Test performance is not FDA approved in patients less than 47 years old. Performed at [facility], [address] [place], [place] [address]     Radiology Report ECHOCARDIOGRAM COMPLETE Result  Date: 05/08/2024    ECHOCARDIOGRAM REPORT   Marvin Garcia Name:   FAWAZ BORQUEZ Date of Exam: 05/08/2024 Medical Rec #:  [id number]         Height:       67.0 in Accession #:    [id number]        Weight:       163.6 lb Date of Birth:  1956-06-02          BSA:          1.857 m Marvin Garcia Age:    68 years          BP:           155/95 mmHg Marvin Garcia Gender: M                 HR:           67 bpm. Exam Location:  Inpatient Procedure: 2D Echo, Cardiac Doppler, Color Doppler and Strain Analysis (Both            Spectral and Color Flow Doppler were utilized during procedure). Indications:    Atrial Fibrillation I48.91  History:        Marvin Garcia has no prior history of Echocardiogram examinations.                 Risk Factors:Hypertension. Hx of kidney disease.  Sonographer:    Aida Pizza RCS Referring Phys: JACQUELINE LAVADA POUR [facility] IMPRESSIONS  1. Left ventricular ejection fraction, by estimation, is 60 to 65%. The left ventricle has normal function. The left ventricle has no regional wall motion abnormalities. There is moderate concentric left ventricular hypertrophy. Left ventricular diastolic parameters were normal.  2. Right ventricular systolic function is normal. The right ventricular size is normal. Tricuspid regurgitation signal is inadequate for assessing PA pressure.  3. The mitral valve is degenerative. No evidence of mitral valve regurgitation. No evidence of mitral stenosis. Moderate mitral annular calcification.  4. The aortic valve is calcified. There is moderate  calcification of the aortic valve. There is moderate thickening of the aortic valve. Aortic valve regurgitation is not visualized. Mild aortic valve stenosis. Aortic valve area, by VTI measures 1.55 cm. Aortic valve mean gradient measures 7.5 mmHg. Aortic valve Vmax measures 1.99 m/s.  5. The inferior vena cava is normal in size with greater than 50% respiratory variability, suggesting right atrial pressure of 3 mmHg.  6. Aortic dilatation noted. There is  mild dilatation of the aortic root, measuring 39 mm. FINDINGS  Left Ventricle: Left ventricular ejection fraction, by estimation, is 60 to 65%. The left ventricle has normal function. The left ventricle has no regional wall motion abnormalities. Global longitudinal strain performed but not reported based on interpreter judgement due to suboptimal tracking. The left ventricular internal cavity size was normal in size. There is moderate concentric left ventricular hypertrophy. Left ventricular diastolic parameters were normal. Normal left ventricular filling pressure. Right Ventricle: The right ventricular size is normal. No increase in right ventricular wall thickness. Right ventricular systolic function is normal. Tricuspid regurgitation signal is inadequate for assessing PA pressure. Left Atrium: Left atrial size was normal in size. Right Atrium: Right atrial size was normal in size. Pericardium: Trivial pericardial effusion is present. The pericardial effusion is anterior to the right ventricle. Mitral Valve: The mitral valve is degenerative in appearance. There is mild thickening of the mitral valve leaflet(s). Moderate mitral annular calcification. No evidence of mitral valve regurgitation. No evidence of mitral valve stenosis. Tricuspid Valve: The tricuspid valve is normal in structure. Tricuspid valve regurgitation is not demonstrated. No evidence of tricuspid stenosis. Aortic Valve: The aortic valve is calcified. There is moderate calcification of the aortic valve. There is moderate thickening of the aortic valve. Aortic valve regurgitation is not visualized. Mild aortic stenosis is present. Aortic valve mean gradient measures 7.5 mmHg. Aortic valve peak gradient measures 15.8 mmHg. Aortic valve area, by VTI measures 1.55 cm. Pulmonic Valve: The pulmonic valve was normal in structure. Pulmonic valve regurgitation is mild. No evidence of pulmonic stenosis. Aorta: Aortic dilatation noted. There is mild  dilatation of the aortic root, measuring 39 mm. Venous: The inferior vena cava is normal in size with greater than 50% respiratory variability, suggesting right atrial pressure of 3 mmHg. IAS/Shunts: No atrial level shunt detected by color flow Doppler.  LEFT VENTRICLE PLAX 2D LVIDd:         4.10 cm   Diastology LVIDs:         2.50 cm   LV e' medial:    8.92 cm/s LV PW:         1.30 cm   LV E/e' medial:  5.6 LV IVS:        1.40 cm   LV e' lateral:   12.10 cm/s LVOT diam:     1.80 cm   LV E/e' lateral: 4.1 LV SV:         55 LV SV Index:   29 LVOT Area:     2.54 cm  RIGHT VENTRICLE RV S prime:     15.10 cm/s TAPSE (M-mode): 2.4 cm LEFT ATRIUM              Index        RIGHT ATRIUM           Index LA diam:        3.10 cm  1.67 cm/m   RA Area:     17.30 cm LA Vol (A2C):   103.0 ml 55.48 ml/m  RA Volume:   43.30 ml  23.32 ml/m LA Vol (A4C):   63.6 ml  34.25 ml/m LA Biplane Vol: 84.3 ml  45.40 ml/m  AORTIC VALVE AV Area (Vmax):    1.49 cm AV Area (Vmean):   1.59 cm AV Area (VTI):     1.55 cm AV Vmax:           198.75 cm/s AV Vmean:          121.250 cm/s AV VTI:            0.352 m AV Peak Grad:      15.8 mmHg AV Mean Grad:      7.5 mmHg LVOT Vmax:         116.00 cm/s LVOT Vmean:        75.700 cm/s LVOT VTI:          0.215 m LVOT/AV VTI ratio: 0.61  AORTA Ao Root diam: 3.90 cm MITRAL VALVE MV Area (PHT): 1.86 cm    SHUNTS MV Decel Time: 408 msec    Systemic VTI:  0.22 m MV E velocity: 49.60 cm/s  Systemic Diam: 1.80 cm MV A velocity: 53.40 cm/s MV E/A ratio:  0.93 Wilbert Bihari MD Electronically signed by Wilbert Bihari MD Signature Date/Time: 05/08/2024/6:31:11 PM    Final    IR TUNNELED CENTRAL VENOUS CATH [facility] IMG Result Date: 05/07/2024 INDICATION: ESRD requiring HD initiation EXAM: TUNNELED CENTRAL VENOUS HEMODIALYSIS CATHETER PLACEMENT WITH ULTRASOUND AND FLUOROSCOPIC GUIDANCE MEDICATIONS: Ancef  2 gm IV. The antibiotic was given in an appropriate time interval prior to skin puncture. ANESTHESIA/SEDATION:  Local anesthetic and single agent sedation was employed during this procedure. A total of fentanyl  75 mcg was administered intravenously. The Marvin Garcia's level of consciousness and vital signs were monitored continuously by radiology nursing throughout the procedure under my direct supervision. FLUOROSCOPY: Radiation Exposure Index and estimated peak skin dose (PSD); Reference air kerma (RAK), 2.3 mGy. COMPLICATIONS: None immediate. PROCEDURE: Informed written consent was obtained from the Marvin Garcia and/or Marvin Garcia's representative after a discussion of the risks, benefits, and alternatives to treatment. Questions regarding the procedure were encouraged and answered. The RIGHT neck and chest were prepped with chlorhexidine  in a sterile fashion, and a sterile drape was applied covering the operative field. Maximum barrier sterile technique with sterile gowns and gloves were used for the procedure. A timeout was performed prior to the initiation of the procedure. After creating a small venotomy incision, a micropuncture kit was utilized to access the internal jugular vein. Real-time ultrasound guidance was utilized for vascular access including the acquisition of a permanent ultrasound image documenting patency of the accessed vessel. The microwire was utilized to measure appropriate catheter length. A stiff Glidewire was advanced to the level of the IVC and the micropuncture sheath was exchanged for a peel-away sheath. A palindrome tunneled hemodialysis catheter measuring 19 cm from tip to cuff was tunneled in a retrograde fashion from the anterior chest wall to the venotomy incision. The catheter was then placed through the peel-away sheath with tips ultimately positioned within the superior aspect of the right atrium. Final catheter positioning was confirmed and documented with a spot radiographic image. The catheter aspirates and flushes normally. The catheter was flushed with appropriate volume heparin  dwells. The  catheter exit site was secured with a 0-Prolene retention suture. The venotomy incision was closed with Dermabond. Dressings were applied. The Marvin Garcia tolerated the procedure well without immediate post procedural complication. IMPRESSION: Successful placement of 19 cm tip to cuff  tunneled hemodialysis catheter via the RIGHT internal jugular vein. The tip of the catheter is positioned at the superior cavo-atrial junction. The catheter is ready for immediate use. Thom Hall, MD [facility]iology Electronically Signed   By: Thom Hall M.D.   On: 05/07/2024 14:06     Signature  -   Lavada Stank M.D on 05/09/2024 at 8:29 AM   -  To page go to [email or web address]

## 2024-05-09 NOTE — Progress Notes (Signed)
 Rounding Note   Patient Name: Marvin Garcia Date of Encounter: 05/09/2024  [facility] Cardiologist: Redell Shallow, MD   Subjective -NAEO -Patient presented for dialysis today - Remains in A-fib with RVR - Denies having any symptoms denying chest pain, SOB, swelling, syncope, presyncope   Scheduled Meds:  carvedilol   25 mg Oral BID WC   Chlorhexidine  Gluconate Cloth  6 each Topical Daily   [START ON 05/10/2024] Chlorhexidine  Gluconate Cloth  6 each Topical Q0600   [START ON 05/10/2024] diltiazem  90 mg Oral Q6H   heparin  injection (subcutaneous)  5,000 Units Subcutaneous Q8H   hydrALAZINE   100 mg Oral TID   isosorbide  mononitrate  60 mg Oral Daily   Continuous Infusions:  chlorproMAZINE  (THORAZINE ) 25 mg in sodium chloride  0.9 % 25 mL IVPB 25 mg (05/09/24 1206)   PRN Meds: acetaminophen  **OR** acetaminophen , baclofen, chlorproMAZINE  (THORAZINE ) 25 mg in sodium chloride  0.9 % 25 mL IVPB, hydrALAZINE , metoprolol tartrate, ondansetron  **OR** ondansetron  (ZOFRAN ) IV, senna-docusate   Vital Signs  Vitals:   05/09/24 1400 05/09/24 1401 05/09/24 1402 05/09/24 1403  BP: (!) 138/108     Pulse: (!) 122 (!) 113 (!) 116 (!) 113  Resp: 18 17 17 14   Temp:      TempSrc:      SpO2: 95% 92% 95% 95%  Weight:      Height:        Intake/Output Summary (Last 24 hours) at 05/09/2024 1408 Last data filed at 05/09/2024 1300 Gross per 24 hour  Intake 531.83 ml  Output 450.6 ml  Net 81.23 ml      05/09/2024    8:30 AM 05/07/2024    5:46 PM 05/04/2024    3:47 PM  Last 3 Weights  Weight (lbs) 165 lb 12.6 oz 163 lb 9.3 oz 171 lb  Weight (kg) 75.2 kg 74.2 kg 77.565 kg      Telemetry Atrial fibrillation with RVR- Personally Reviewed  ECG  ECG from 9/27 demonstrates atrial fibrillation with RVR- Personally Reviewed  Physical Exam GEN: Atraumatic, normocephalic no acute distress.   Neck: No JVD Cardiac: Tachycardic with irregularly irregular rhythm, II/VI systolic  ejection murmur at the apex, no rubs, or gallops.  Respiratory: Clear to auscultation bilaterally. GI: Soft, nontender, non-distended  MS: No edema; No deformity. Neuro:  Nonfocal  Psych: Normal affect   Labs High Sensitivity Troponin:   Recent Labs  Lab 05/08/24 0015 05/08/24 0359  TROPONINIHS 266* 275*     Chemistry Recent Labs  Lab 05/04/24 1552 05/06/24 0258 05/07/24 1223 05/08/24 0015  NA 139 137 138  --   K 4.4 3.9 3.8  --   CL 99 102 103  --   CO2 16* 16* 16*  --   GLUCOSE 109* 83 122*  --   BUN 149* 157* 158*  --   CREATININE 14.20* 15.64* 16.76*  --   CALCIUM  9.7 9.1 9.5  --   MG  --   --   --  2.2  PROT 7.5  --   --   --   ALBUMIN 4.4  --  3.6  --   AST 15  --   --   --   ALT 6  --   --   --   ALKPHOS 84  --   --   --   BILITOT 0.5  --   --   --   GFRNONAA 3* 3* 3*  --   ANIONGAP 24* 19* 19*  --  Lipids No results for input(s): CHOL, TRIG, HDL, LABVLDL, LDLCALC, CHOLHDL in the last 168 hours.  Hematology Recent Labs  Lab 05/06/24 0258 05/08/24 0359 05/09/24 0500  WBC 7.0 8.4 8.6  RBC 3.55* 3.64* 3.81*  HGB 8.6* 8.9* 9.2*  HCT 26.4* 26.9* 28.4*  MCV 74.4* 73.9* 74.5*  MCH 24.2* 24.5* 24.1*  MCHC 32.6 33.1 32.4  RDW 18.8* 19.1* 19.9*  PLT 275 280 303   Thyroid  Recent Labs  Lab 05/08/24 0015  TSH 2.816  FREET4 0.86    BNPNo results for input(s): BNP, PROBNP in the last 168 hours.  DDimer No results for input(s): DDIMER in the last 168 hours.   Radiology  ECHOCARDIOGRAM COMPLETE Result Date: 05/08/2024    ECHOCARDIOGRAM REPORT   Patient Name:   Marvin Garcia Date of Exam: 05/08/2024 Medical Rec #:  [id number]         Height:       67.0 in Accession #:    [id number]        Weight:       163.6 lb Date of Birth:  02-13-1956          BSA:          1.857 m Patient Age:    68 years          BP:           155/95 mmHg Patient Gender: M                 HR:           67 bpm. Exam Location:  Inpatient Procedure: 2D Echo, Cardiac  Doppler, Color Doppler and Strain Analysis (Both            Spectral and Color Flow Doppler were utilized during procedure). Indications:    Atrial Fibrillation I48.91  History:        Patient has no prior history of Echocardiogram examinations.                 Risk Factors:Hypertension. Hx of kidney disease.  Sonographer:    Aida Pizza RCS Referring Phys: JACQUELINE LAVADA POUR [facility] IMPRESSIONS  1. Left ventricular ejection fraction, by estimation, is 60 to 65%. The left ventricle has normal function. The left ventricle has no regional wall motion abnormalities. There is moderate concentric left ventricular hypertrophy. Left ventricular diastolic parameters were normal.  2. Right ventricular systolic function is normal. The right ventricular size is normal. Tricuspid regurgitation signal is inadequate for assessing PA pressure.  3. The mitral valve is degenerative. No evidence of mitral valve regurgitation. No evidence of mitral stenosis. Moderate mitral annular calcification.  4. The aortic valve is calcified. There is moderate calcification of the aortic valve. There is moderate thickening of the aortic valve. Aortic valve regurgitation is not visualized. Mild aortic valve stenosis. Aortic valve area, by VTI measures 1.55 cm. Aortic valve mean gradient measures 7.5 mmHg. Aortic valve Vmax measures 1.99 m/s.  5. The inferior vena cava is normal in size with greater than 50% respiratory variability, suggesting right atrial pressure of 3 mmHg.  6. Aortic dilatation noted. There is mild dilatation of the aortic root, measuring 39 mm. FINDINGS  Left Ventricle: Left ventricular ejection fraction, by estimation, is 60 to 65%. The left ventricle has normal function. The left ventricle has no regional wall motion abnormalities. Global longitudinal strain performed but not reported based on interpreter judgement due to suboptimal tracking. The left ventricular internal cavity size was normal in  size. There is moderate  concentric left ventricular hypertrophy. Left ventricular diastolic parameters were normal. Normal left ventricular filling pressure. Right Ventricle: The right ventricular size is normal. No increase in right ventricular wall thickness. Right ventricular systolic function is normal. Tricuspid regurgitation signal is inadequate for assessing PA pressure. Left Atrium: Left atrial size was normal in size. Right Atrium: Right atrial size was normal in size. Pericardium: Trivial pericardial effusion is present. The pericardial effusion is anterior to the right ventricle. Mitral Valve: The mitral valve is degenerative in appearance. There is mild thickening of the mitral valve leaflet(s). Moderate mitral annular calcification. No evidence of mitral valve regurgitation. No evidence of mitral valve stenosis. Tricuspid Valve: The tricuspid valve is normal in structure. Tricuspid valve regurgitation is not demonstrated. No evidence of tricuspid stenosis. Aortic Valve: The aortic valve is calcified. There is moderate calcification of the aortic valve. There is moderate thickening of the aortic valve. Aortic valve regurgitation is not visualized. Mild aortic stenosis is present. Aortic valve mean gradient measures 7.5 mmHg. Aortic valve peak gradient measures 15.8 mmHg. Aortic valve area, by VTI measures 1.55 cm. Pulmonic Valve: The pulmonic valve was normal in structure. Pulmonic valve regurgitation is mild. No evidence of pulmonic stenosis. Aorta: Aortic dilatation noted. There is mild dilatation of the aortic root, measuring 39 mm. Venous: The inferior vena cava is normal in size with greater than 50% respiratory variability, suggesting right atrial pressure of 3 mmHg. IAS/Shunts: No atrial level shunt detected by color flow Doppler.  LEFT VENTRICLE PLAX 2D LVIDd:         4.10 cm   Diastology LVIDs:         2.50 cm   LV e' medial:    8.92 cm/s LV PW:         1.30 cm   LV E/e' medial:  5.6 LV IVS:        1.40 cm   LV e'  lateral:   12.10 cm/s LVOT diam:     1.80 cm   LV E/e' lateral: 4.1 LV SV:         55 LV SV Index:   29 LVOT Area:     2.54 cm  RIGHT VENTRICLE RV S prime:     15.10 cm/s TAPSE (M-mode): 2.4 cm LEFT ATRIUM              Index        RIGHT ATRIUM           Index LA diam:        3.10 cm  1.67 cm/m   RA Area:     17.30 cm LA Vol (A2C):   103.0 ml 55.48 ml/m  RA Volume:   43.30 ml  23.32 ml/m LA Vol (A4C):   63.6 ml  34.25 ml/m LA Biplane Vol: 84.3 ml  45.40 ml/m  AORTIC VALVE AV Area (Vmax):    1.49 cm AV Area (Vmean):   1.59 cm AV Area (VTI):     1.55 cm AV Vmax:           198.75 cm/s AV Vmean:          121.250 cm/s AV VTI:            0.352 m AV Peak Grad:      15.8 mmHg AV Mean Grad:      7.5 mmHg LVOT Vmax:         116.00 cm/s LVOT Vmean:  75.700 cm/s LVOT VTI:          0.215 m LVOT/AV VTI ratio: 0.61  AORTA Ao Root diam: 3.90 cm MITRAL VALVE MV Area (PHT): 1.86 cm    SHUNTS MV Decel Time: 408 msec    Systemic VTI:  0.22 m MV E velocity: 49.60 cm/s  Systemic Diam: 1.80 cm MV A velocity: 53.40 cm/s MV E/A ratio:  0.93 Wilbert Bihari MD Electronically signed by Wilbert Bihari MD Signature Date/Time: 05/08/2024/6:31:11 PM    Final     Cardiac Studies   TTE 9/28:  IMPRESSIONS     1. Left ventricular ejection fraction, by estimation, is 60 to 65%. The  left ventricle has normal function. The left ventricle has no regional  wall motion abnormalities. There is moderate concentric left ventricular  hypertrophy. Left ventricular  diastolic parameters were normal.   2. Right ventricular systolic function is normal. The right ventricular  size is normal. Tricuspid regurgitation signal is inadequate for assessing  PA pressure.   3. The mitral valve is degenerative. No evidence of mitral valve  regurgitation. No evidence of mitral stenosis. Moderate mitral annular  calcification.   4. The aortic valve is calcified. There is moderate calcification of the  aortic valve. There is moderate  thickening of the aortic valve. Aortic  valve regurgitation is not visualized. Mild aortic valve stenosis. Aortic  valve area, by VTI measures 1.55  cm. Aortic valve mean gradient measures 7.5 mmHg. Aortic valve Vmax  measures 1.99 m/s.   5. The inferior vena cava is normal in size with greater than 50%  respiratory variability, suggesting right atrial pressure of 3 mmHg.   6. Aortic dilatation noted. There is mild dilatation of the aortic root,  measuring 39 mm.   Patient Profile   Mr. Caraveo is a 68 y.o. male with a PMH of HTN and ESRD who presented with abdominal pain secondary to a large renal mass.  Subsequently developed atrial fibrillation.  Cardiology is consulted for management of A-fib.  Assessment & Plan   #Newly Diagnosed PAF ::Patient diagnosed with new onset atrial fibrillation with RVR.  Rates are currently uncontrolled.  Will plan to increase his p.o. diltiazem dose and fractionated to ensure stability.  The patient's CHA2DS2-VASc score = 3 which necessitates long-term oral anticoagulation.  He is currently awaiting surgery in the next couple of weeks for his RCC.  Given the potential for RCC to bleed, I recommend holding off on systemic anticoagulation until a definitive surgical plan has been determined.  Once he is out of this perioperative phase, then would favor starting systemic anticoagulation. - Holding systemic anticoagulation pending surgery - Discontinue diltiazem 180 mg daily - Start p.o. diltiazem 90 mg q6h -Continue carvedilol  25 mg twice daily - Target heart rate <130 for now - Maintain telemetry - Daily renal function panels and magnesium  for electrolytes  #HTN :: BPs are mildly elevated but not inappropriate while acutely hospitalized.  Continue current regimen. - Continue Coreg , hydralazine , Imdur   #ESRD - Dialysis per nephrology      For questions or updates, please contact [facility] Please consult [email or web address] for contact info  under       Signed, Georganna Archer, MD  05/09/2024, 2:08 PM

## 2024-05-09 NOTE — Progress Notes (Signed)
 Admit: 05/04/2024 LOS: 4  62M progressive CKD5 admitted with large R renal mass concering for RCC  Subjective:  Received first HD treatment yesterday, tolerated well UOP .  Cards consult yesterday for Afib.  Cont coreg , transition IV Cardizem to Cardizem CD. Future Apixaban.  Seen in HD.  Overall doing well.  No events overnight.  Having ongoing hiccups.  On room air.  09/28 0701 - 09/29 0700 In: 356.1 [I.V.:356.1] Out: 700 [Urine:700]  Filed Weights   05/04/24 1547 05/07/24 1746  Weight: 77.6 kg 74.2 kg    Scheduled Meds:  carvedilol   25 mg Oral BID WC   Chlorhexidine  Gluconate Cloth  6 each Topical Daily   diltiazem  180 mg Oral Daily   heparin  injection (subcutaneous)  5,000 Units Subcutaneous Q8H   hydrALAZINE   100 mg Oral TID   isosorbide  mononitrate  60 mg Oral Daily   Continuous Infusions:  chlorproMAZINE  (THORAZINE ) 25 mg in sodium chloride  0.9 % 25 mL IVPB     PRN Meds:.acetaminophen  **OR** acetaminophen , baclofen, chlorproMAZINE  (THORAZINE ) 25 mg in sodium chloride  0.9 % 25 mL IVPB, hydrALAZINE , metoprolol tartrate, ondansetron  **OR** ondansetron  (ZOFRAN ) IV, senna-docusate  Current Labs: reviewed   Physical Exam:  Blood pressure (!) 147/91, pulse 67, temperature 98.7 F (37.1 C), temperature source Oral, resp. rate 15, height 5' 7 (1.702 m), weight 74.2 kg, SpO2 95%. Gen: comfortable, awake and alert  Eyes:  EOMI CV: RRR Abd:  soft, nontender Lungs: clear GU: no foley Extr: no edema Neuro: conversant, attentive Skin: no rashes or lesions, right IJ tunneled HD catheter bandaged, clean  A Longstanding and progressive CKD 5 with significant azotemia followed by Dr. Gearline, now ESRD Large right renal mass concerning for extensive RCC, urology thinks potentially curable with surgery AGMA in the setting of #1, monitor Anemia: Holding ESA given #2.  TSAT 13%.  Monitor. Hypertension, stable, monitor on current regimen A-fib with RVR overnight 05/08/2024,  back in sinus rhythm this morning on heparin  diltiazem and carvedilol .  Cardiology following.  P HD #2, 05/09/24 Plan HD #3 tomorrow.  Then likely transition to routine 3 times a week treatment. Notify renal navigator of new ESRD status 9/29 but surgical plan needs to be defined first  Eventually to need AVF evaluation but would let urological surgical plan be defined first Medication Issues; Preferred narcotic agents for pain control are hydromorphone , fentanyl , and methadone. Morphine should not be used.  Baclofen should be avoided Avoid oral sodium phosphate  and magnesium  citrate based laxatives / bowel preps   Discussed with primary team  Dr. Evalene HERO Elese Rane  05/09/2024, 8:33 AM  Recent Labs  Lab 05/04/24 1552 05/06/24 0258 05/07/24 1223 05/08/24 0015  NA 139 137 138  --   K 4.4 3.9 3.8  --   CL 99 102 103  --   CO2 16* 16* 16*  --   GLUCOSE 109* 83 122*  --   BUN 149* 157* 158*  --   CREATININE 14.20* 15.64* 16.76*  --   CALCIUM  9.7 9.1 9.5  --   PHOS  --   --  7.4* 5.0*   Recent Labs  Lab 05/04/24 1552 05/06/24 0258 05/08/24 0359  WBC 6.0 7.0 8.4  NEUTROABS 4.2  --   --   HGB 8.1* 8.6* 8.9*  HCT 25.2* 26.4* 26.9*  MCV 75.2* 74.4* 73.9*  PLT 260 275 280

## 2024-05-09 NOTE — Progress Notes (Signed)
  Received patient in bed to unit.   Informed consent signed and in chart.    TX duration: 2.5     Transported by  Hand-off given to patient's nurse.    Access used: rt CVC Access issues: None   Total UF removed:  Medication(s) given: 0.6 Post HD VS: 190/65    Hunter Hacking LPN [facility]

## 2024-05-09 NOTE — Progress Notes (Signed)
Patient off the unit for HD

## 2024-05-10 DIAGNOSIS — E872 Acidosis, unspecified: Secondary | ICD-10-CM | POA: Diagnosis not present

## 2024-05-10 DIAGNOSIS — N179 Acute kidney failure, unspecified: Secondary | ICD-10-CM | POA: Diagnosis not present

## 2024-05-10 DIAGNOSIS — I472 Ventricular tachycardia, unspecified: Secondary | ICD-10-CM | POA: Diagnosis not present

## 2024-05-10 DIAGNOSIS — I120 Hypertensive chronic kidney disease with stage 5 chronic kidney disease or end stage renal disease: Secondary | ICD-10-CM | POA: Diagnosis not present

## 2024-05-10 DIAGNOSIS — C641 Malignant neoplasm of right kidney, except renal pelvis: Secondary | ICD-10-CM | POA: Diagnosis not present

## 2024-05-10 DIAGNOSIS — I480 Paroxysmal atrial fibrillation: Secondary | ICD-10-CM | POA: Diagnosis not present

## 2024-05-10 LAB — HEPATITIS B SURFACE ANTIBODY, QUANTITATIVE: Hep B S AB Quant (Post): <3.5 m[IU]/mL — ABNORMAL LOW

## 2024-05-10 LAB — CBC
HCT: 29.8 % — ABNORMAL LOW (ref 39.0–52.0)
Hemoglobin: 9.7 g/dL — ABNORMAL LOW (ref 13.0–17.0)
MCH: 24.2 pg — ABNORMAL LOW (ref 26.0–34.0)
MCHC: 32.6 g/dL (ref 30.0–36.0)
MCV: 74.3 fL — ABNORMAL LOW (ref 80.0–100.0)
Platelets: 309 K/uL (ref 150–400)
RBC: 4.01 MIL/uL — ABNORMAL LOW (ref 4.22–5.81)
RDW: 19.6 % — ABNORMAL HIGH (ref 11.5–15.5)
WBC: 7.9 K/uL (ref 4.0–10.5)
nRBC: 0.3 % — ABNORMAL HIGH (ref 0.0–0.2)

## 2024-05-10 LAB — RENAL FUNCTION PANEL
Albumin: 3.4 g/dL — ABNORMAL LOW (ref 3.5–5.0)
Anion gap: 16 — ABNORMAL HIGH (ref 5–15)
BUN: 72 mg/dL — ABNORMAL HIGH (ref 8–23)
CO2: 19 mmol/L — ABNORMAL LOW (ref 22–32)
Calcium: 9.4 mg/dL (ref 8.9–10.3)
Chloride: 99 mmol/L (ref 98–111)
Creatinine, Ser: 10.08 mg/dL — ABNORMAL HIGH (ref 0.61–1.24)
GFR, Estimated: 5 mL/min — ABNORMAL LOW (ref >60)
Glucose, Bld: 96 mg/dL (ref 70–99)
Phosphorus: 5.4 mg/dL — ABNORMAL HIGH (ref 2.5–4.6)
Potassium: 3.4 mmol/L — ABNORMAL LOW (ref 3.5–5.1)
Sodium: 134 mmol/L — ABNORMAL LOW (ref 135–145)

## 2024-05-10 MED ORDER — METOPROLOL TARTRATE 5 MG/5ML IV SOLN
5.0000 mg | Freq: Once | INTRAVENOUS | Status: AC
Start: 2024-05-10 — End: 2024-05-10
  Administered 2024-05-10: 5 mg via INTRAVENOUS
  Filled 2024-05-10: qty 5

## 2024-05-10 MED ORDER — DILTIAZEM HCL 25 MG/5ML IV SOLN: 10.0000 mg | Freq: Four times a day (QID) | INTRAVENOUS | Status: DC | PRN

## 2024-05-10 NOTE — Progress Notes (Signed)
   05/10/24 1635  Vitals  Temp 98.2 F (36.8 C)  Pulse Rate 79  Resp 15  BP (!) 143/78  SpO2 97 %  Weight 74 kg  Type of Weight Post-Dialysis  Post Treatment  Dialyzer Clearance Clear  Hemodialysis Intake (mL) 0 mL  Liters Processed 54  Fluid Removed (mL) 500 mL  Tolerated HD Treatment Yes   Received patient in bed to unit.  Alert and oriented.  Informed consent signed and in chart.   TX duration:3HRS  Patient tolerated well.  Transported back to the room  Alert, without acute distress.  Hand-off given to patient's nurse.   Access used: [facility] Access issues: NONE  Total UF removed: Medication(s) given: NONE    Marvin Garcia Paraskevi Funez [facility]

## 2024-05-10 NOTE — Progress Notes (Signed)
 Pt has been accepted at [facility] on MWF 7:05 am chair time. Pt can start on Friday and will need to arrive at 6:45 am. Met with pt at bedside while receiving HD. Discussed above arrangements and pt agreeable to plans. Schedule letter provided to pt and will add info to AVS as well. Update provided to attending, nephrologist, pt's RN, and RN CM. Will assist as needed.   Randine Mungo Dialysis Navigator [phone]

## 2024-05-10 NOTE — Progress Notes (Signed)
 Rounding Note   Patient Name: Marvin Garcia Date of Encounter: 05/10/2024  [facility] Cardiologist: Redell Shallow, MD   Subjective  - No acute events overnight. - Patient received IV metoprolol overnight for tachycardia.  He was subsequently started on a diltiazem infusion for rate control. - Today the patient says that he feels fine he denies having any palpitations, chest pain, shortness of breath, PND, syncope or presyncope.  Scheduled Meds:  carvedilol   25 mg Oral BID WC   Chlorhexidine  Gluconate Cloth  6 each Topical Daily   Chlorhexidine  Gluconate Cloth  6 each Topical Q0600   diltiazem  90 mg Oral Q6H   heparin  injection (subcutaneous)  5,000 Units Subcutaneous Q8H   hydrALAZINE   100 mg Oral TID   isosorbide  mononitrate  60 mg Oral Daily   Continuous Infusions:  chlorproMAZINE  (THORAZINE ) 25 mg in sodium chloride  0.9 % 25 mL IVPB Stopped (05/10/24 0123)   PRN Meds: acetaminophen  **OR** acetaminophen , baclofen, chlorproMAZINE  (THORAZINE ) 25 mg in sodium chloride  0.9 % 25 mL IVPB, diltiazem, hydrALAZINE , metoprolol tartrate, ondansetron  **OR** ondansetron  (ZOFRAN ) IV, senna-docusate   Vital Signs  Vitals:   05/10/24 0600 05/10/24 0630 05/10/24 0736 05/10/24 0800  BP: 138/89 131/80 113/72 120/75  Pulse: 73 77  67  Resp: 17 17  18   Temp:   98.5 F (36.9 C)   TempSrc:   Oral   SpO2: 95% 96%  95%  Weight:      Height:        Intake/Output Summary (Last 24 hours) at 05/10/2024 0929 Last data filed at 05/10/2024 0600 Gross per 24 hour  Intake 460.84 ml  Output 925.6 ml  Net -464.76 ml      05/09/2024    8:30 AM 05/07/2024    5:46 PM 05/04/2024    3:47 PM  Last 3 Weights  Weight (lbs) 165 lb 12.6 oz 163 lb 9.3 oz 171 lb  Weight (kg) 75.2 kg 74.2 kg 77.565 kg      Telemetry Rate controlled atrial fibrillation with occasional PVCs- Personally Reviewed  ECG  Atrial fibrillation with RVR- Personally Reviewed  Physical Exam  GEN: No acute  distress.   Neck: No JVD Cardiac: Regular rate with irregularly irregular rhythm, no murmurs, rubs, or gallops.  Respiratory: Clear to auscultation bilaterally. GI: Soft, nontender, non-distended  MS: No edema; No deformity. Neuro:  Nonfocal  Psych: Normal affect   Labs High Sensitivity Troponin:   Recent Labs  Lab 05/08/24 0015 05/08/24 0359  TROPONINIHS 266* 275*     Chemistry Recent Labs  Lab 05/04/24 1552 05/06/24 0258 05/07/24 1223 05/08/24 0015 05/09/24 1933 05/10/24 0309  NA 139   < > 138  --  134* 134*  K 4.4   < > 3.8  --  3.5 3.4*  CL 99   < > 103  --  98 99  CO2 16*   < > 16*  --  21* 19*  GLUCOSE 109*   < > 122*  --  107* 96  BUN 149*   < > 158*  --  69* 72*  CREATININE 14.20*   < > 16.76*  --  9.69* 10.08*  CALCIUM  9.7   < > 9.5  --  9.3 9.4  MG  --   --   --  2.2 1.9  --   PROT 7.5  --   --   --   --   --   ALBUMIN 4.4  --  3.6  --  3.5  3.4*  AST 15  --   --   --   --   --   ALT 6  --   --   --   --   --   ALKPHOS 84  --   --   --   --   --   BILITOT 0.5  --   --   --   --   --   GFRNONAA 3*   < > 3*  --  5* 5*  ANIONGAP 24*   < > 19*  --  15 16*   < > = values in this interval not displayed.    Lipids No results for input(s): CHOL, TRIG, HDL, LABVLDL, LDLCALC, CHOLHDL in the last 168 hours.  Hematology Recent Labs  Lab 05/08/24 0359 05/09/24 0500 05/10/24 0309  WBC 8.4 8.6 7.9  RBC 3.64* 3.81* 4.01*  HGB 8.9* 9.2* 9.7*  HCT 26.9* 28.4* 29.8*  MCV 73.9* 74.5* 74.3*  MCH 24.5* 24.1* 24.2*  MCHC 33.1 32.4 32.6  RDW 19.1* 19.9* 19.6*  PLT 280 303 309   Thyroid  Recent Labs  Lab 05/08/24 0015  TSH 2.816  FREET4 0.86    BNPNo results for input(s): BNP, PROBNP in the last 168 hours.  DDimer No results for input(s): DDIMER in the last 168 hours.   Radiology  ECHOCARDIOGRAM COMPLETE Result Date: 05/08/2024    ECHOCARDIOGRAM REPORT   Patient Name:   Marvin Garcia Date of Exam: 05/08/2024 Medical Rec #:  [id number]          Height:       67.0 in Accession #:    [id number]        Weight:       163.6 lb Date of Birth:  [birth year]          BSA:          1.857 m Patient Age:    68 years          BP:           155/95 mmHg Patient Gender: M                 HR:           67 bpm. Exam Location:  Inpatient Procedure: 2D Echo, Cardiac Doppler, Color Doppler and Strain Analysis (Both            Spectral and Color Flow Doppler were utilized during procedure). Indications:    Atrial Fibrillation I48.91  History:        Patient has no prior history of Echocardiogram examinations.                 Risk Factors:Hypertension. Hx of kidney disease.  Sonographer:    Aida Pizza RCS Referring Phys: JACQUELINE LAVADA POUR [facility] IMPRESSIONS  1. Left ventricular ejection fraction, by estimation, is 60 to 65%. The left ventricle has normal function. The left ventricle has no regional wall motion abnormalities. There is moderate concentric left ventricular hypertrophy. Left ventricular diastolic parameters were normal.  2. Right ventricular systolic function is normal. The right ventricular size is normal. Tricuspid regurgitation signal is inadequate for assessing PA pressure.  3. The mitral valve is degenerative. No evidence of mitral valve regurgitation. No evidence of mitral stenosis. Moderate mitral annular calcification.  4. The aortic valve is calcified. There is moderate calcification of the aortic valve. There is moderate thickening of the aortic valve. Aortic valve regurgitation is not visualized. Mild aortic valve stenosis.  Aortic valve area, by VTI measures 1.55 cm. Aortic valve mean gradient measures 7.5 mmHg. Aortic valve Vmax measures 1.99 m/s.  5. The inferior vena cava is normal in size with greater than 50% respiratory variability, suggesting right atrial pressure of 3 mmHg.  6. Aortic dilatation noted. There is mild dilatation of the aortic root, measuring 39 mm. FINDINGS  Left Ventricle: Left ventricular ejection fraction, by estimation, is 60  to 65%. The left ventricle has normal function. The left ventricle has no regional wall motion abnormalities. Global longitudinal strain performed but not reported based on interpreter judgement due to suboptimal tracking. The left ventricular internal cavity size was normal in size. There is moderate concentric left ventricular hypertrophy. Left ventricular diastolic parameters were normal. Normal left ventricular filling pressure. Right Ventricle: The right ventricular size is normal. No increase in right ventricular wall thickness. Right ventricular systolic function is normal. Tricuspid regurgitation signal is inadequate for assessing PA pressure. Left Atrium: Left atrial size was normal in size. Right Atrium: Right atrial size was normal in size. Pericardium: Trivial pericardial effusion is present. The pericardial effusion is anterior to the right ventricle. Mitral Valve: The mitral valve is degenerative in appearance. There is mild thickening of the mitral valve leaflet(s). Moderate mitral annular calcification. No evidence of mitral valve regurgitation. No evidence of mitral valve stenosis. Tricuspid Valve: The tricuspid valve is normal in structure. Tricuspid valve regurgitation is not demonstrated. No evidence of tricuspid stenosis. Aortic Valve: The aortic valve is calcified. There is moderate calcification of the aortic valve. There is moderate thickening of the aortic valve. Aortic valve regurgitation is not visualized. Mild aortic stenosis is present. Aortic valve mean gradient measures 7.5 mmHg. Aortic valve peak gradient measures 15.8 mmHg. Aortic valve area, by VTI measures 1.55 cm. Pulmonic Valve: The pulmonic valve was normal in structure. Pulmonic valve regurgitation is mild. No evidence of pulmonic stenosis. Aorta: Aortic dilatation noted. There is mild dilatation of the aortic root, measuring 39 mm. Venous: The inferior vena cava is normal in size with greater than 50% respiratory variability,  suggesting right atrial pressure of 3 mmHg. IAS/Shunts: No atrial level shunt detected by color flow Doppler.  LEFT VENTRICLE PLAX 2D LVIDd:         4.10 cm   Diastology LVIDs:         2.50 cm   LV e' medial:    8.92 cm/s LV PW:         1.30 cm   LV E/e' medial:  5.6 LV IVS:        1.40 cm   LV e' lateral:   12.10 cm/s LVOT diam:     1.80 cm   LV E/e' lateral: 4.1 LV SV:         55 LV SV Index:   29 LVOT Area:     2.54 cm  RIGHT VENTRICLE RV S prime:     15.10 cm/s TAPSE (M-mode): 2.4 cm LEFT ATRIUM              Index        RIGHT ATRIUM           Index LA diam:        3.10 cm  1.67 cm/m   RA Area:     17.30 cm LA Vol (A2C):   103.0 ml 55.48 ml/m  RA Volume:   43.30 ml  23.32 ml/m LA Vol (A4C):   63.6 ml  34.25 ml/m LA Biplane Vol: 84.3  ml  45.40 ml/m  AORTIC VALVE AV Area (Vmax):    1.49 cm AV Area (Vmean):   1.59 cm AV Area (VTI):     1.55 cm AV Vmax:           198.75 cm/s AV Vmean:          121.250 cm/s AV VTI:            0.352 m AV Peak Grad:      15.8 mmHg AV Mean Grad:      7.5 mmHg LVOT Vmax:         116.00 cm/s LVOT Vmean:        75.700 cm/s LVOT VTI:          0.215 m LVOT/AV VTI ratio: 0.61  AORTA Ao Root diam: 3.90 cm MITRAL VALVE MV Area (PHT): 1.86 cm    SHUNTS MV Decel Time: 408 msec    Systemic VTI:  0.22 m MV E velocity: 49.60 cm/s  Systemic Diam: 1.80 cm MV A velocity: 53.40 cm/s MV E/A ratio:  0.93 Wilbert Bihari MD Electronically signed by Wilbert Bihari MD Signature Date/Time: 05/08/2024/6:31:11 PM    Final     Cardiac Studies  No new studies  Patient Profile   Mr. Wadlow is a 68 y.o. male with a PMH of HTN and ESRD who presented with abdominal pain secondary to a large renal mass.  Subsequently developed atrial fibrillation.  Cardiology is consulted for management of A-fib.   Assessment & Plan   #Newly Diagnosed PAF ::Patient remains in atrial fibrillation but is rate controlled on the diltiazem infusion.  I am willing to tolerate some mild tachycardia given his clinical  stability.  I would like to try him on a higher dose of p.o. diltiazem at this time, so we will discontinue diltiazem infusion.  Still holding systemic anticoagulation pending surgical evaluation. - Holding systemic anticoagulation pending surgery - Discontinue diltiazem infusion - Start p.o. diltiazem 90 mg q6h today -Continue carvedilol  25 mg twice daily - Target heart rate <130 for now - Maintain telemetry - Daily renal function panels and magnesium  for electrolytes   #HTN :: BPs are mildly elevated but not inappropriate while acutely hospitalized.  Continue current regimen. - Continue Coreg , hydralazine , Imdur    #ESRD - Dialysis per nephrology     For questions or updates, please contact [facility] Please consult [email or web address] for contact info under       Signed, Georganna Archer, MD  05/10/2024, 9:29 AM

## 2024-05-10 NOTE — Care Management Important Message (Signed)
 Important Message  Patient Details  Name: Marvin Garcia MRN: [id number] Date of Birth: [birth year]   Important Message Given:  Yes - Medicare IM     Claretta Deed 05/10/2024, 4:02 PM

## 2024-05-10 NOTE — Progress Notes (Signed)
 PROGRESS NOTE                                                                                                                                                                                                             Patient Demographics:    Marvin Garcia, is a 68 y.o. male, DOB - 04-29-1956, FMW:[id number]  Outpatient Primary MD for the patient is Patient, No Pcp Per    LOS - 5  Admit date - 05/04/2024    Chief Complaint  Patient presents with   Mass       Brief Narrative (HPI from H&P)    68 y.o. male with PMH of chronic kidney disease stage V, hypertension presented with abdominal discomfort and fullness to [facility]. He satarted noticing bulge on left abdomen since Sunday. Patient otherwise denies any nausea, vomiting, chest pain, shortness of breath, fever, chills, headache, focal weakness, numbness tingling, speech difficulties. He has not had recetn weight loss, or night sweats or chills.  The workup in the ER suggested a large right sided renal mass suspicious for RCC with possible mets to the lungs.   Subjective:   Patient in bed, appears comfortable, denies any headache, no fever, no chest pain or pressure, no shortness of breath , no abdominal pain. No new focal weakness.  Hiccups have resolved.  Went into fast heart rate again late night 05/10/2024.   Assessment  & Plan :   Progressive CKD 5 vs AKI on CKD, Metabolic acidosis: Nephrology following, HD catheter was placed on 05/07/2024 by IR, HD was started on the same date.  Nephrology on board.     Large right-sided renal mass likely RCC: Urology has been consulted and is outpatient resection once patient is discharged and dialysis has been initiated, likely in the next 2 to 3 weeks in the outpatient setting, will follow-up with Dr. Patrcia.   New onset paroxysmal atrial fibrillation with RVR.  Noticed night of 05/07/2024, [place] vas 2 score 3.  Seen by cardiology on  oral Cardizem and Coreg , anticoagulation after nephrectomy per cardiology, echo stable TSH stable, he had converted to sinus rhythm however went into A-fib RVR again night of 05/10/2024 requiring Cardizem drip, cardiology will titrate medications further they have been informed.  Will monitor.  Anemia of renal disease: Monitor hemoglobin transfuse less than 7 g.  Hypertension: Currently placed on combination of Coreg , hydralazine , Imdur  and Cardizem.  Monitor and adjust.  Incidental finding of mild aortic root dilation on echo.  Follow-up with cardiology and PCP.  Beta-blocker if blood pressure tolerates.    New onset hiccups.  Baclofen and Thorazine  for now and monitor.    Lung nodules.  Repeat CT in 3 months follow-up with pulmonary postdischarge.      Condition -   Guarded  Family Communication  : Wife Ms. Trudee  [phone]  updated in detail on 05/06/2024  Code Status :  Full  Consults  :  Renal, Urology, cardiology  PUD Prophylaxis :    Procedures  :     ECHO - 1. Left ventricular ejection fraction, by estimation, is 60 to 65%. The left ventricle has normal function. The left ventricle has no regional wall motion abnormalities. There is moderate concentric left ventricular hypertrophy. Left ventricular diastolic parameters were normal.  2. Right ventricular systolic function is normal. The right ventricular size is normal. Tricuspid regurgitation signal is inadequate for assessing PA pressure.  3. The mitral valve is degenerative. No evidence of mitral valve regurgitation. No evidence of mitral stenosis. Moderate mitral annular calcification.  4. The aortic valve is calcified. There is moderate calcification of the aortic valve. There is moderate thickening of the aortic valve. Aortic valve regurgitation is not visualized. Mild aortic valve stenosis. Aortic valve area, by VTI measures 1.55 cm. Aortic valve mean gradient measures 7.5 mmHg. Aortic valve Vmax measures 1.99 m/s.  5.  The inferior vena cava is normal in size with greater than 50% respiratory variability, suggesting right atrial pressure of 3 mmHg.  6. Aortic dilatation noted. There is mild dilatation of the aortic root, measuring 39 mm.  Right IJ HD catheter placed by IR on 05/07/2024.    CT Chest - 1. Two 23 mm left lower lobe pulmonary nodules, technically indeterminate but likely benign. In the setting of known malignancy, consider follow-up CT chest in 3-6 months. 2. Trace bilateral pleural effusions. 3. Large right renal mass, unchanged from recent CT abdomen and pelvis.  CT Abd pelvis -   1. Large right renal mass (15.4 x 19.1 x 16.7 cm) with variable density, likely representing primary renal cell carcinoma with central necrosis. The mass demonstrates endophytic and exophytic extension, mass effect on the gallbladder and ascending colon without direct invasion, and extension into the right renal pelvis without definite involvement of the right renal vein or proximal right ureter. The mass also extends beyond Gerota fascia laterally, abutting the peritoneal lining, and causes significant mass effect on the inferior vena cava without direct invasion. No adjacent pathologic adenopathy or hydronephrosis. 2. Multiple indeterminate exophytic cortical lesions in the left kidney, measuring up to 15 mm, not well characterized on this noncontrast examination. Dedicated contrast-enhanced MRI examination is recommended for further evaluation. 3. Non-emergent CT examination of the chest is recommended for staging purposes      Disposition Plan  :    Status is: Inpatient   DVT Prophylaxis  :    heparin  injection 5,000 Units Start: 05/09/24 1400 SCDs Start: 05/05/24 1524    Lab Results  Component Value Date   PLT 309 05/10/2024    Diet :  Diet Order             Diet renal with fluid restriction Fluid restriction: 1500 mL Fluid; Room service appropriate? Yes; Fluid consistency: Thin  Diet effective now  Inpatient Medications  Scheduled Meds:  carvedilol   25 mg Oral BID WC   Chlorhexidine  Gluconate Cloth  6 each Topical Daily   Chlorhexidine  Gluconate Cloth  6 each Topical Q0600   diltiazem  90 mg Oral Q6H   heparin  injection (subcutaneous)  5,000 Units Subcutaneous Q8H   hydrALAZINE   100 mg Oral TID   isosorbide  mononitrate  60 mg Oral Daily   Continuous Infusions:  chlorproMAZINE  (THORAZINE ) 25 mg in sodium chloride  0.9 % 25 mL IVPB Stopped (05/10/24 0123)   diltiazem (CARDIZEM) infusion 15 mg/hr (05/10/24 0749)   PRN Meds:.acetaminophen  **OR** acetaminophen , baclofen, chlorproMAZINE  (THORAZINE ) 25 mg in sodium chloride  0.9 % 25 mL IVPB, diltiazem, hydrALAZINE , metoprolol tartrate, ondansetron  **OR** ondansetron  (ZOFRAN ) IV, senna-docusate  Antibiotics  :    Anti-infectives (From admission, onward)    None         Objective:   Vitals:   05/10/24 0530 05/10/24 0600 05/10/24 0630 05/10/24 0736  BP: 123/76 138/89 131/80 113/72  Pulse: 84 73 77   Resp: 18 17 17    Temp:    98.5 F (36.9 C)  TempSrc:    Oral  SpO2: 96% 95% 96%   Weight:      Height:        Wt Readings from Last 3 Encounters:  05/09/24 75.2 kg  04/07/23 87.1 kg  03/24/23 87.1 kg     Intake/Output Summary (Last 24 hours) at 05/10/2024 0810 Last data filed at 05/10/2024 0600 Gross per 24 hour  Intake 460.84 ml  Output 925.6 ml  Net -464.76 ml     Physical Exam  Awake Alert, No new F.N deficits, Normal affect, right IJ HD catheter in place [place].AT,PERRAL Supple Neck, No JVD,   Symmetrical Chest wall movement, Good air movement bilaterally, CTAB RRR,No Gallops,Rubs or new Murmurs,  +ve B.Sounds, Abd Soft, No tenderness,   No Cyanosis, Clubbing or edema        Data Review:    Recent Labs  Lab 05/04/24 1552 05/06/24 0258 05/08/24 0359 05/09/24 0500 05/10/24 0309  WBC 6.0 7.0 8.4 8.6 7.9  HGB 8.1* 8.6* 8.9* 9.2* 9.7*  HCT 25.2* 26.4* 26.9* 28.4* 29.8*  PLT 260  275 280 303 309  MCV 75.2* 74.4* 73.9* 74.5* 74.3*  MCH 24.2* 24.2* 24.5* 24.1* 24.2*  MCHC 32.1 32.6 33.1 32.4 32.6  RDW 18.4* 18.8* 19.1* 19.9* 19.6*  LYMPHSABS 0.8  --   --   --   --   MONOABS 0.7  --   --   --   --   EOSABS 0.1  --   --   --   --   BASOSABS 0.0  --   --   --   --     Recent Labs  Lab 05/04/24 1552 05/06/24 0258 05/07/24 1223 05/08/24 0015 05/09/24 1933 05/10/24 0309  NA 139 137 138  --  134* 134*  K 4.4 3.9 3.8  --  3.5 3.4*  CL 99 102 103  --  98 99  CO2 16* 16* 16*  --  21* 19*  ANIONGAP 24* 19* 19*  --  15 16*  GLUCOSE 109* 83 122*  --  107* 96  BUN 149* 157* 158*  --  69* 72*  CREATININE 14.20* 15.64* 16.76*  --  9.69* 10.08*  AST 15  --   --   --   --   --   ALT 6  --   --   --   --   --  ALKPHOS 84  --   --   --   --   --   BILITOT 0.5  --   --   --   --   --   ALBUMIN 4.4  --  3.6  --  3.5 3.4*  INR  --  1.2  --   --   --   --   TSH  --   --   --  2.816  --   --   MG  --   --   --  2.2 1.9  --   PHOS  --   --  7.4* 5.0* 4.7* 5.4*  CALCIUM  9.7 9.1 9.5  --  9.3 9.4      Recent Labs  Lab 05/04/24 1552 05/06/24 0258 05/07/24 1223 05/08/24 0015 05/09/24 1933 05/10/24 0309  INR  --  1.2  --   --   --   --   TSH  --   --   --  2.816  --   --   MG  --   --   --  2.2 1.9  --   CALCIUM  9.7 9.1 9.5  --  9.3 9.4    --------------------------------------------------------------------------------------------------------------- No results found for: CHOL, HDL, LDLCALC, LDLDIRECT, TRIG, CHOLHDL  No results found for: HGBA1C Recent Labs    05/08/24 0015  TSH 2.816  FREET4 0.86   No results for input(s): VITAMINB12, FOLATE, FERRITIN, TIBC, IRON, RETICCTPCT in the last 72 hours. ------------------------------------------------------------------------------------------------------------------ Cardiac Enzymes No results for input(s): CKMB, TROPONINI, MYOGLOBIN in the last 168 hours.  Invalid input(s):  CK  Micro Results Recent Results (from the past 240 hours)  MRSA Next Gen by PCR, Nasal     Status: None   Collection Time: 05/08/24  7:41 AM   Specimen: Nasal Mucosa; Nasal Swab  Result Value Ref Range Status   MRSA by PCR Next Gen NOT DETECTED NOT DETECTED Final    Comment: (NOTE) The GeneXpert MRSA Assay (FDA approved for NASAL specimens only), is one component of a comprehensive MRSA colonization surveillance program. It is not intended to diagnose MRSA infection nor to guide or monitor treatment for MRSA infections. Test performance is not FDA approved in patients less than 32 years old. Performed at [facility], [address], [place], [place] [address]     Radiology Report ECHOCARDIOGRAM COMPLETE Result Date: 05/08/2024    ECHOCARDIOGRAM REPORT   Patient Name:   ENDRIT GITTINS Date of Exam: 05/08/2024 Medical Rec #:  [id number]         Height:       67.0 in Accession #:    [id number]        Weight:       163.6 lb Date of Birth:  Mar 22, 1956          BSA:          1.857 m Patient Age:    68 years          BP:           155/95 mmHg Patient Gender: M                 HR:           67 bpm. Exam Location:  Inpatient Procedure: 2D Echo, Cardiac Doppler, Color Doppler and Strain Analysis (Both            Spectral and Color Flow Doppler were utilized during procedure). Indications:    Atrial Fibrillation I48.91  History:        Patient has no prior history of Echocardiogram examinations.                 Risk Factors:Hypertension. Hx of kidney disease.  Sonographer:    Aida Pizza RCS Referring Phys: JACQUELINE LAVADA POUR [facility] IMPRESSIONS  1. Left ventricular ejection fraction, by estimation, is 60 to 65%. The left ventricle has normal function. The left ventricle has no regional wall motion abnormalities. There is moderate concentric left ventricular hypertrophy. Left ventricular diastolic parameters were normal.  2. Right ventricular systolic function is normal. The right ventricular size  is normal. Tricuspid regurgitation signal is inadequate for assessing PA pressure.  3. The mitral valve is degenerative. No evidence of mitral valve regurgitation. No evidence of mitral stenosis. Moderate mitral annular calcification.  4. The aortic valve is calcified. There is moderate calcification of the aortic valve. There is moderate thickening of the aortic valve. Aortic valve regurgitation is not visualized. Mild aortic valve stenosis. Aortic valve area, by VTI measures 1.55 cm. Aortic valve mean gradient measures 7.5 mmHg. Aortic valve Vmax measures 1.99 m/s.  5. The inferior vena cava is normal in size with greater than 50% respiratory variability, suggesting right atrial pressure of 3 mmHg.  6. Aortic dilatation noted. There is mild dilatation of the aortic root, measuring 39 mm. FINDINGS  Left Ventricle: Left ventricular ejection fraction, by estimation, is 60 to 65%. The left ventricle has normal function. The left ventricle has no regional wall motion abnormalities. Global longitudinal strain performed but not reported based on interpreter judgement due to suboptimal tracking. The left ventricular internal cavity size was normal in size. There is moderate concentric left ventricular hypertrophy. Left ventricular diastolic parameters were normal. Normal left ventricular filling pressure. Right Ventricle: The right ventricular size is normal. No increase in right ventricular wall thickness. Right ventricular systolic function is normal. Tricuspid regurgitation signal is inadequate for assessing PA pressure. Left Atrium: Left atrial size was normal in size. Right Atrium: Right atrial size was normal in size. Pericardium: Trivial pericardial effusion is present. The pericardial effusion is anterior to the right ventricle. Mitral Valve: The mitral valve is degenerative in appearance. There is mild thickening of the mitral valve leaflet(s). Moderate mitral annular calcification. No evidence of mitral valve  regurgitation. No evidence of mitral valve stenosis. Tricuspid Valve: The tricuspid valve is normal in structure. Tricuspid valve regurgitation is not demonstrated. No evidence of tricuspid stenosis. Aortic Valve: The aortic valve is calcified. There is moderate calcification of the aortic valve. There is moderate thickening of the aortic valve. Aortic valve regurgitation is not visualized. Mild aortic stenosis is present. Aortic valve mean gradient measures 7.5 mmHg. Aortic valve peak gradient measures 15.8 mmHg. Aortic valve area, by VTI measures 1.55 cm. Pulmonic Valve: The pulmonic valve was normal in structure. Pulmonic valve regurgitation is mild. No evidence of pulmonic stenosis. Aorta: Aortic dilatation noted. There is mild dilatation of the aortic root, measuring 39 mm. Venous: The inferior vena cava is normal in size with greater than 50% respiratory variability, suggesting right atrial pressure of 3 mmHg. IAS/Shunts: No atrial level shunt detected by color flow Doppler.  LEFT VENTRICLE PLAX 2D LVIDd:         4.10 cm   Diastology LVIDs:         2.50 cm   LV e' medial:    8.92 cm/s LV PW:         1.30 cm   LV E/e'  medial:  5.6 LV IVS:        1.40 cm   LV e' lateral:   12.10 cm/s LVOT diam:     1.80 cm   LV E/e' lateral: 4.1 LV SV:         55 LV SV Index:   29 LVOT Area:     2.54 cm  RIGHT VENTRICLE RV S prime:     15.10 cm/s TAPSE (M-mode): 2.4 cm LEFT ATRIUM              Index        RIGHT ATRIUM           Index LA diam:        3.10 cm  1.67 cm/m   RA Area:     17.30 cm LA Vol (A2C):   103.0 ml 55.48 ml/m  RA Volume:   43.30 ml  23.32 ml/m LA Vol (A4C):   63.6 ml  34.25 ml/m LA Biplane Vol: 84.3 ml  45.40 ml/m  AORTIC VALVE AV Area (Vmax):    1.49 cm AV Area (Vmean):   1.59 cm AV Area (VTI):     1.55 cm AV Vmax:           198.75 cm/s AV Vmean:          121.250 cm/s AV VTI:            0.352 m AV Peak Grad:      15.8 mmHg AV Mean Grad:      7.5 mmHg LVOT Vmax:         116.00 cm/s LVOT Vmean:         75.700 cm/s LVOT VTI:          0.215 m LVOT/AV VTI ratio: 0.61  AORTA Ao Root diam: 3.90 cm MITRAL VALVE MV Area (PHT): 1.86 cm    SHUNTS MV Decel Time: 408 msec    Systemic VTI:  0.22 m MV E velocity: 49.60 cm/s  Systemic Diam: 1.80 cm MV A velocity: 53.40 cm/s MV E/A ratio:  0.93 Wilbert Bihari MD Electronically signed by Wilbert Bihari MD Signature Date/Time: 05/08/2024/6:31:11 PM    Final      Signature  -   Lavada Stank M.D on 05/10/2024 at 8:10 AM   -  To page go to [email or web address]

## 2024-05-10 NOTE — Plan of Care (Signed)

## 2024-05-10 NOTE — Progress Notes (Signed)
 Admit: 05/04/2024 LOS: 5  74M progressive CKD5 admitted with large R renal mass concering for RCC  Subjective:  HD yesterday: 2.5-hour, UF 600 mL, stable UOP 1.2L  Plan HD today.  Seen in bed.  Doing well.   09/29 0701 - 09/30 0700 In: 460.8 [P.O.:300; I.V.:85.8; IV Piggyback:75] Out: 1225.6 [Urine:1225]  Filed Weights   05/04/24 1547 05/07/24 1746 05/09/24 0830  Weight: 77.6 kg 74.2 kg 75.2 kg    Scheduled Meds:  carvedilol   25 mg Oral BID WC   Chlorhexidine  Gluconate Cloth  6 each Topical Daily   Chlorhexidine  Gluconate Cloth  6 each Topical Q0600   diltiazem  90 mg Oral Q6H   heparin  injection (subcutaneous)  5,000 Units Subcutaneous Q8H   hydrALAZINE   100 mg Oral TID   isosorbide  mononitrate  60 mg Oral Daily   Continuous Infusions:  chlorproMAZINE  (THORAZINE ) 25 mg in sodium chloride  0.9 % 25 mL IVPB Stopped (05/10/24 0123)   diltiazem (CARDIZEM) infusion 15 mg/hr (05/10/24 0749)   PRN Meds:.acetaminophen  **OR** acetaminophen , baclofen, chlorproMAZINE  (THORAZINE ) 25 mg in sodium chloride  0.9 % 25 mL IVPB, diltiazem, hydrALAZINE , metoprolol tartrate, ondansetron  **OR** ondansetron  (ZOFRAN ) IV, senna-docusate  Current Labs: reviewed   Physical Exam:  Blood pressure 120/75, pulse 67, temperature 98.5 F (36.9 C), temperature source Oral, resp. rate 18, height 5' 7 (1.702 m), weight 75.2 kg, SpO2 95%. Gen: comfortable, awake and alert  Eyes:  EOMI CV: RRR Abd:  soft, nontender Lungs: clear GU: no foley Extr: no edema Neuro: conversant, attentive Skin: no rashes or lesions, right IJ tunneled HD catheter bandaged, clean  A Longstanding and progressive CKD 5 with significant azotemia followed by Dr. Gearline, now ESRD Large right renal mass concerning for extensive RCC, urology thinks potentially curable with surgery -plan outpatient surgery AGMA in the setting of #1, stable Anemia: Holding ESA given #2.  TSAT 13%.  Monitor. Hypertension, stable, monitor on current  regimen A-fib with RVR overnight 05/08/2024, back in sinus rhythm this morning on heparin  diltiazem and carvedilol .  Cardiology following.  P Plan HD #3 today.  Then likely transition to routine 3 times a week treatment. Notify renal navigator of new ESRD status 9/29 but surgical plan needs to be defined first  Eventually to need AVF evaluation but would let urological surgical plan be defined first Medication Issues; Preferred narcotic agents for pain control are hydromorphone , fentanyl , and methadone. Morphine should not be used.  Baclofen should be avoided Avoid oral sodium phosphate  and magnesium  citrate based laxatives / bowel preps   Discussed with primary team  Dr. Evalene HERO Cameo Shewell  05/10/2024, 8:45 AM  Recent Labs  Lab 05/07/24 1223 05/08/24 0015 05/09/24 1933 05/10/24 0309  NA 138  --  134* 134*  K 3.8  --  3.5 3.4*  CL 103  --  98 99  CO2 16*  --  21* 19*  GLUCOSE 122*  --  107* 96  BUN 158*  --  69* 72*  CREATININE 16.76*  --  9.69* 10.08*  CALCIUM  9.5  --  9.3 9.4  PHOS 7.4* 5.0* 4.7* 5.4*   Recent Labs  Lab 05/04/24 1552 05/06/24 0258 05/08/24 0359 05/09/24 0500 05/10/24 0309  WBC 6.0   < > 8.4 8.6 7.9  NEUTROABS 4.2  --   --   --   --   HGB 8.1*   < > 8.9* 9.2* 9.7*  HCT 25.2*   < > 26.9* 28.4* 29.8*  MCV 75.2*   < > 73.9*  74.5* 74.3*  PLT 260   < > 280 303 309   < > = values in this interval not displayed.

## 2024-05-11 ENCOUNTER — Encounter (HOSPITAL_COMMUNITY): Payer: Self-pay | Admitting: Nephrology

## 2024-05-11 ENCOUNTER — Other Ambulatory Visit (HOSPITAL_COMMUNITY): Payer: Self-pay

## 2024-05-11 DIAGNOSIS — I480 Paroxysmal atrial fibrillation: Secondary | ICD-10-CM | POA: Diagnosis not present

## 2024-05-11 DIAGNOSIS — N179 Acute kidney failure, unspecified: Secondary | ICD-10-CM | POA: Diagnosis not present

## 2024-05-11 LAB — RENAL FUNCTION PANEL
Albumin: 3.5 g/dL (ref 3.5–5.0)
Anion gap: 13 (ref 5–15)
BUN: 39 mg/dL — ABNORMAL HIGH (ref 8–23)
CO2: 23 mmol/L (ref 22–32)
Calcium: 9.4 mg/dL (ref 8.9–10.3)
Chloride: 95 mmol/L — ABNORMAL LOW (ref 98–111)
Creatinine, Ser: 6.85 mg/dL — ABNORMAL HIGH (ref 0.61–1.24)
GFR, Estimated: 8 mL/min — ABNORMAL LOW (ref >60)
Glucose, Bld: 114 mg/dL — ABNORMAL HIGH (ref 70–99)
Phosphorus: 3.9 mg/dL (ref 2.5–4.6)
Potassium: 3 mmol/L — ABNORMAL LOW (ref 3.5–5.1)
Sodium: 131 mmol/L — ABNORMAL LOW (ref 135–145)

## 2024-05-11 LAB — CBC
HCT: 29.5 % — ABNORMAL LOW (ref 39.0–52.0)
Hemoglobin: 9.6 g/dL — ABNORMAL LOW (ref 13.0–17.0)
MCH: 24.6 pg — ABNORMAL LOW (ref 26.0–34.0)
MCHC: 32.5 g/dL (ref 30.0–36.0)
MCV: 75.4 fL — ABNORMAL LOW (ref 80.0–100.0)
Platelets: 268 K/uL (ref 150–400)
RBC: 3.91 MIL/uL — ABNORMAL LOW (ref 4.22–5.81)
RDW: 19.9 % — ABNORMAL HIGH (ref 11.5–15.5)
WBC: 8.5 K/uL (ref 4.0–10.5)
nRBC: 0 % (ref 0.0–0.2)

## 2024-05-11 MED ORDER — POTASSIUM CHLORIDE CRYS ER 20 MEQ PO TBCR
40.0000 meq | EXTENDED_RELEASE_TABLET | Freq: Once | ORAL | Status: AC
  Administered 2024-05-11: 40 meq via ORAL
  Filled 2024-05-11: qty 2

## 2024-05-11 MED ORDER — SENNOSIDES-DOCUSATE SODIUM 8.6-50 MG PO TABS
1.0000 | ORAL_TABLET | Freq: Once | ORAL | Status: AC
  Administered 2024-05-11: 1 via ORAL
  Filled 2024-05-11: qty 1

## 2024-05-11 MED ORDER — DILTIAZEM HCL ER COATED BEADS 180 MG PO CP24: 360.0000 mg | ORAL_CAPSULE | Freq: Every day | ORAL | Status: DC

## 2024-05-11 MED ORDER — DILTIAZEM HCL 60 MG PO TABS
90.0000 mg | ORAL_TABLET | Freq: Four times a day (QID) | ORAL | Status: DC
  Administered 2024-05-11: 90 mg via ORAL
  Filled 2024-05-11: qty 1

## 2024-05-11 MED ORDER — DILTIAZEM HCL ER COATED BEADS 360 MG PO CP24
360.0000 mg | ORAL_CAPSULE | Freq: Every day | ORAL | 0 refills | Status: AC
  Filled 2024-05-11: qty 30, 30d supply, fill #0

## 2024-05-11 MED ORDER — ACETAMINOPHEN 325 MG PO TABS: 650.0000 mg | ORAL_TABLET | Freq: Four times a day (QID) | ORAL | Status: AC | PRN

## 2024-05-11 NOTE — Progress Notes (Signed)
 Admit: 05/04/2024 LOS: 6  73M progressive CKD5 admitted with large R renal mass concering for RCC  Subjective:  HD yesterday: 3-hour, UF 500 mL, stable UOP 300  Accepted at [facility] MWF Seen in bed.  Doing well. Pending discharge today.   09/30 0701 - 10/01 0700 In: -  Out: 800 [Urine:300]  Filed Weights   05/09/24 0830 05/10/24 1321 05/10/24 1635  Weight: 75.2 kg 74.4 kg 74 kg    Scheduled Meds:  carvedilol   25 mg Oral BID WC   Chlorhexidine  Gluconate Cloth  6 each Topical Daily   Chlorhexidine  Gluconate Cloth  6 each Topical Q0600   diltiazem  90 mg Oral Q6H   heparin  injection (subcutaneous)  5,000 Units Subcutaneous Q8H   hydrALAZINE   100 mg Oral TID   isosorbide  mononitrate  60 mg Oral Daily   Continuous Infusions:  chlorproMAZINE  (THORAZINE ) 25 mg in sodium chloride  0.9 % 25 mL IVPB 25 mg (05/10/24 1837)   PRN Meds:.acetaminophen  **OR** acetaminophen , baclofen, chlorproMAZINE  (THORAZINE ) 25 mg in sodium chloride  0.9 % 25 mL IVPB, diltiazem, hydrALAZINE , metoprolol tartrate, ondansetron  **OR** ondansetron  (ZOFRAN ) IV, senna-docusate  Current Labs: reviewed   Physical Exam:  Blood pressure (!) 147/85, pulse 71, temperature 98.2 F (36.8 C), temperature source Oral, resp. rate (!) 22, height 5' 7 (1.702 m), weight 74 kg, SpO2 96%. Gen: comfortable, awake and alert  Eyes:  EOMI CV: RRR Abd:  soft, nontender Lungs: clear GU: no foley Extr: no edema Neuro: conversant, attentive Skin: no rashes or lesions, right IJ tunneled HD catheter bandaged, clean  A Longstanding and progressive CKD 5 with significant azotemia followed by Dr. Gearline, now ESRD Large right renal mass concerning for extensive RCC, urology thinks potentially curable with surgery -plan outpatient surgery AGMA in the setting of #1, resolved Anemia: Holding ESA given #2.  TSAT 13%.  Monitor. Hypertension, stable, monitor on current regimen A-fib with RVR , new: Cardiology following.   Transitioning to p.o. medications.  P HD #3 yesterday, 9/30.  Plan transition to routine 3 times a week treatment. Notify renal navigator of new ESRD status 9/29  Accepted to [facility] [place] on MWF.  Plan DC today.  Planning Friday start. Eventually to need AVF evaluation in the outpatient setting, but would let urological surgical plan be defined first Medication Issues; Preferred narcotic agents for pain control are hydromorphone , fentanyl , and methadone. Morphine should not be used.  Baclofen should be avoided Avoid oral sodium phosphate  and magnesium  citrate based laxatives / bowel preps   Discussed with primary team  Dr. Evalene HERO Lucyann Romano  05/11/2024, 8:59 AM  Recent Labs  Lab 05/09/24 1933 05/10/24 0309 05/11/24 0258  NA 134* 134* 131*  K 3.5 3.4* 3.0*  CL 98 99 95*  CO2 21* 19* 23  GLUCOSE 107* 96 114*  BUN 69* 72* 39*  CREATININE 9.69* 10.08* 6.85*  CALCIUM  9.3 9.4 9.4  PHOS 4.7* 5.4* 3.9   Recent Labs  Lab 05/04/24 1552 05/06/24 0258 05/09/24 0500 05/10/24 0309 05/11/24 0258  WBC 6.0   < > 8.6 7.9 8.5  NEUTROABS 4.2  --   --   --   --   HGB 8.1*   < > 9.2* 9.7* 9.6*  HCT 25.2*   < > 28.4* 29.8* 29.5*  MCV 75.2*   < > 74.5* 74.3* 75.4*  PLT 260   < > 303 309 268   < > = values in this interval not displayed.

## 2024-05-11 NOTE — Plan of Care (Signed)
 [facility]  Initial Hemodialysis Orders Dialysis center: [facility]  Patient's name: Marvin Garcia DOB: 05/25/1956 PMH: Progressive CKD, HTN, renal mass  Discharge diagnosis: New ESRD.  Large right renal mass c/f RCC. Following with urology for outpatient surgery  New onset AFib  Allergies:  Allergies  Allergen Reactions   Hydrocodone Other (See Comments)    Severe hiccups   Date of First Dialysis: 05/07/2024  Cause of renal disease: --  Dialysis Prescription: Dialysis Frequency: three times per week  Tx duration: 3.5H x 1 then 4H BFR: 350 x1 then 400  DFR: 600 EDW: 74 kg   Dialyzer: 180NRe UF profile/Sodium modeling?: -- Dialysis Bath: 3 K 2 Ca  Dialysis access: -R internal jugular TDC placed 05/07/24 by IR  -Will need eval for perm access after renal surgery   In Center Medications: Heparin  Dose: None  Type: -- VDRA: per protocol  Venofer: per protocol  No ESA secondary to renal mass   Discharge labs: Hgb: 9.6 K+: 3.0  Ca: 9.4  Phos: 3.9 Alb: 3.5   Please draw routine monthly labs. Additional labs needed: Weekly potassium for now   Maisie Fellows Carter Kassel PA-C

## 2024-05-11 NOTE — Progress Notes (Signed)
 D/C order noted. Contacted [facility] to be advised of pt's d/c today and that pt should start on Friday as planned. Contacted renal PA to request that orders be sent to clinic. Discussed HD arrangements with pt yesterday and info added to AVS as well.   Randine Mungo Dialysis Navigator [phone]

## 2024-05-11 NOTE — Discharge Summary (Signed)
 Physician Discharge Summary  Pacey Altizer FMW:[id number] DOB: [birth year] DOA: 05/04/2024  PCP: Patient, No Pcp Per  Admit date: 05/04/2024 Discharge date: 05/11/2024  Admitted From: (Home) Disposition:  (Home)  Recommendations for Outpatient Follow-up:  Follow up with PCP in 1-2 weeks Please obtain BMP/CBC in one week Repeat CT in 3 months follow-up with pulmonary postdischarg to follow on pulmonary nodules Urology Dr. Patrcia will arrange for close outpatient follow-up guarding finding of right sided renal mass   Diet recommendation: Renal/heart healthy diet with 1.2 L fluid restriction per day  Brief/Interim Summary:    68 y.o. male with PMH of chronic kidney disease stage V, hypertension presented with abdominal discomfort and fullness to [facility] ED. He satarted noticing bulge on left abdomen since Sunday. Patient otherwise denies any nausea, vomiting, chest pain, shortness of breath, fever, chills, headache, focal weakness, numbness tingling, speech difficulties. He has not had recetn weight loss, or night sweats or chills.  The workup in the ER suggested a large right sided renal mass suspicious for RCC, as well lung nodules, please see discussion below. .   Progressive CKD 5 vs AKI on CKD, Metabolic acidosis: Nephrology following, HD catheter was placed on 05/07/2024 by IR, HD was started on the same date.  Nephrology on board.  Will continue Monday Wednesday Friday HD schedule, next dialysis scheduled for outpatient on Friday.    Large right-sided renal mass likely RCC: Urology has been consulted and is outpatient resection once patient is discharged and dialysis has been initiated, likely in the next 2 to 3 weeks in the outpatient setting, will follow-up with Dr. Patrcia.  I have discussed with Dr. Patrcia who will arrange for close outpatient follow-up. -At this point we will hold on full anticoagulation to prevent a bleed in his RCC mass which will complicate and delay the surgery    New onset paroxysmal atrial fibrillation with RVR.  Noticed night of 05/07/2024, [place] vas 2 score 3.  Seen by cardiology on oral Cardizem and Coreg , anticoagulation after nephrectomy per cardiology, echo stable TSH stable, he had converted to sinus rhythm however went into A-fib RVR again night of 05/10/2024 requiring Cardizem drip, cardiology will titrate medications further they have been informed.  He is currently on scheduled short acting Cardizem, and plan to transition to Cardizem CD in a.m., he is currently in normal sinus rhythm - Please see above discussion regarding holding full anticoagulation as risks outweighed benefits in the setting of recent RCC diagnosis when anticipation for surgical intervention.   Anemia of renal disease: Monitor hemoglobin transfuse less than 7 g.   Hypertension: Currently placed on combination of Coreg , hydralazine , Imdur  and Cardizem.  Monitor and adjust.   Incidental finding of mild aortic root dilation on echo.  Follow-up with cardiology and PCP.  Beta-blocker if blood pressure tolerates.     New onset hiccups.  Baclofen and Thorazine  as needed during hospital stay   Lung nodules.  Repeat CT in 3 months follow-up with pulmonary postdischarg  Discharge Diagnoses:  Principal Problem:   ARF (acute renal failure) Active Problems:   Paroxysmal atrial fibrillation with rapid ventricular response (HCC)   Stage 5 chronic kidney disease (HCC)    Discharge Instructions  Discharge Instructions     Diet - low sodium heart healthy   Complete by: As directed    Discharge instructions   Complete by: As directed    Follow with Primary MD  in 7 days   Get CBC, CMP, checked  by Primary  MD next visit.    Activity: As tolerated with Full fall precautions use walker/cane & assistance as needed   Disposition Home   Diet:  renal diet, heart healthy with 1.2 L fluid restriction  On your next visit with your primary care physician please Get Medicines  reviewed and adjusted.   Please request your Prim.MD to go over all Hospital Tests and Procedure/Radiological results at the follow up, please get all Hospital records sent to your Prim MD by signing hospital release before you go home.   If you experience worsening of your admission symptoms, develop shortness of breath, life threatening emergency, suicidal or homicidal thoughts you must seek medical attention immediately by calling 911 or calling your MD immediately  if symptoms less severe.  You Must read complete instructions/literature along with all the possible adverse reactions/side effects for all the Medicines you take and that have been prescribed to you. Take any new Medicines after you have completely understood and accpet all the possible adverse reactions/side effects.   Do not drive, operating heavy machinery, perform activities at heights, swimming or participation in water  activities or provide baby sitting services if your were admitted for syncope or siezures until you have seen by Primary MD or a Neurologist and advised to do so again.  Do not drive when taking Pain medications.    Do not take more than prescribed Pain, Sleep and Anxiety Medications  Special Instructions: If you have smoked or chewed Tobacco  in the last 2 yrs please stop smoking, stop any regular Alcohol  and or any Recreational drug use.  Wear Seat belts while driving.   Please note  You were cared for by a hospitalist during your hospital stay. If you have any questions about your discharge medications or the care you received while you were in the hospital after you are discharged, you can call the unit and asked to speak with the hospitalist on call if the hospitalist that took care of you is not available. Once you are discharged, your primary care physician will handle any further medical issues. Please note that NO REFILLS for any discharge medications will be authorized once you are discharged,  as it is imperative that you return to your primary care physician (or establish a relationship with a primary care physician if you do not have one) for your aftercare needs so that they can reassess your need for medications and monitor your lab values.   Increase activity slowly   Complete by: As directed    No wound care   Complete by: As directed       Allergies as of 05/11/2024       Reactions   Hydrocodone Other (See Comments)   Severe hiccups        Medication List     STOP taking these medications    acetaminophen  650 MG CR tablet Commonly known as: TYLENOL  Replaced by: acetaminophen  325 MG tablet   amLODipine  10 MG tablet Commonly known as: NORVASC    aspirin  81 MG chewable tablet   cloNIDine  0.3 MG tablet Commonly known as: CATAPRES    irbesartan  150 MG tablet Commonly known as: AVAPRO    methocarbamol  500 MG tablet Commonly known as: Robaxin    vitamin C  1000 MG tablet       TAKE these medications    acetaminophen  325 MG tablet Commonly known as: TYLENOL  Take 2 tablets (650 mg total) by mouth every 6 (six) hours as needed for mild pain (pain score 1-3) or  fever (or Fever >/= 101). Replaces: acetaminophen  650 MG CR tablet   carvedilol  25 MG tablet Commonly known as: COREG  Take 25 mg by mouth 2 (two) times daily with a meal.   CoQ-10 100 MG Caps Take 100 mg by mouth daily.   diltiazem 360 MG 24 hr capsule Commonly known as: CARDIZEM CD Take 1 capsule (360 mg total) by mouth daily. Start taking on: May 12, 2024   Febuxostat  80 MG Tabs Take 80 mg by mouth daily.   hydrALAZINE  100 MG tablet Commonly known as: APRESOLINE  Take 100 mg by mouth 3 (three) times daily.   isosorbide  mononitrate 30 MG 24 hr tablet Commonly known as: IMDUR  Take 30 mg by mouth daily.   multivitamin with minerals Tabs tablet Take 1 tablet by mouth daily. *alive mens 50+*   Omega 3 500 500 MG Caps Take 500 mg by mouth daily.   OVER THE COUNTER MEDICATION Take  1 tablet by mouth daily. **vitra-cq10-100 mg*   OVER THE COUNTER MEDICATION Take 1 tablet by mouth daily. **ultimate iron 50 mg**        Follow-up Information     [facility] Follow up.   Why: Need follow up establish PCP Contact information: [address] [place]  [address] [phone]        [facility]. Go on 05/13/2024.   Why: Schedule is Monday, Wednesday, Friday with 7:05 am start time.  On Friday (10/3), please arrive at 6:45 am. Contact information: [address] [place] [address] [phone]         Emerick Avelina POUR, PA-C Follow up.   Specialty: Physician Assistant Why: TIME : 10:30 AM   PLEASE ARRIVE AT  10::00 AM DATE : OCTOBER 06 , 2025 WEDNESDAY  PLEASE  BRING ALL CURRENT MEDICATION ,ID and  INS CARD Contact information: [address], [address] [place] [address] [phone]                Allergies  Allergen Reactions   Hydrocodone Other (See Comments)    Severe hiccups    Consultations: Cardiology Nephrology Urology  Procedures/Studies: ECHOCARDIOGRAM COMPLETE Result Date: 05/08/2024    ECHOCARDIOGRAM REPORT   Patient Name:   EMRAN MOLZAHN Date of Exam: 05/08/2024 Medical Rec #:  [id number]         Height:       67.0 in Accession #:    [id number]        Weight:       163.6 lb Date of Birth:  11-May-1956          BSA:          1.857 m Patient Age:    68 years          BP:           155/95 mmHg Patient Gender: M                 HR:           67 bpm. Exam Location:  Inpatient Procedure: 2D Echo, Cardiac Doppler, Color Doppler and Strain Analysis (Both            Spectral and Color Flow Doppler were utilized during procedure). Indications:    Atrial Fibrillation I48.91  History:        Patient has no prior history of Echocardiogram examinations.                 Risk Factors:Hypertension. Hx  of kidney disease.  Sonographer:    Aida Pizza RCS Referring Phys:  JACQUELINE LAVADA POUR [facility] IMPRESSIONS  1. Left ventricular ejection fraction, by estimation, is 60 to 65%. The left ventricle has normal function. The left ventricle has no regional wall motion abnormalities. There is moderate concentric left ventricular hypertrophy. Left ventricular diastolic parameters were normal.  2. Right ventricular systolic function is normal. The right ventricular size is normal. Tricuspid regurgitation signal is inadequate for assessing PA pressure.  3. The mitral valve is degenerative. No evidence of mitral valve regurgitation. No evidence of mitral stenosis. Moderate mitral annular calcification.  4. The aortic valve is calcified. There is moderate calcification of the aortic valve. There is moderate thickening of the aortic valve. Aortic valve regurgitation is not visualized. Mild aortic valve stenosis. Aortic valve area, by VTI measures 1.55 cm. Aortic valve mean gradient measures 7.5 mmHg. Aortic valve Vmax measures 1.99 m/s.  5. The inferior vena cava is normal in size with greater than 50% respiratory variability, suggesting right atrial pressure of 3 mmHg.  6. Aortic dilatation noted. There is mild dilatation of the aortic root, measuring 39 mm. FINDINGS  Left Ventricle: Left ventricular ejection fraction, by estimation, is 60 to 65%. The left ventricle has normal function. The left ventricle has no regional wall motion abnormalities. Global longitudinal strain performed but not reported based on interpreter judgement due to suboptimal tracking. The left ventricular internal cavity size was normal in size. There is moderate concentric left ventricular hypertrophy. Left ventricular diastolic parameters were normal. Normal left ventricular filling pressure. Right Ventricle: The right ventricular size is normal. No increase in right ventricular wall thickness. Right ventricular systolic function is normal. Tricuspid regurgitation signal is inadequate for assessing PA pressure. Left  Atrium: Left atrial size was normal in size. Right Atrium: Right atrial size was normal in size. Pericardium: Trivial pericardial effusion is present. The pericardial effusion is anterior to the right ventricle. Mitral Valve: The mitral valve is degenerative in appearance. There is mild thickening of the mitral valve leaflet(s). Moderate mitral annular calcification. No evidence of mitral valve regurgitation. No evidence of mitral valve stenosis. Tricuspid Valve: The tricuspid valve is normal in structure. Tricuspid valve regurgitation is not demonstrated. No evidence of tricuspid stenosis. Aortic Valve: The aortic valve is calcified. There is moderate calcification of the aortic valve. There is moderate thickening of the aortic valve. Aortic valve regurgitation is not visualized. Mild aortic stenosis is present. Aortic valve mean gradient measures 7.5 mmHg. Aortic valve peak gradient measures 15.8 mmHg. Aortic valve area, by VTI measures 1.55 cm. Pulmonic Valve: The pulmonic valve was normal in structure. Pulmonic valve regurgitation is mild. No evidence of pulmonic stenosis. Aorta: Aortic dilatation noted. There is mild dilatation of the aortic root, measuring 39 mm. Venous: The inferior vena cava is normal in size with greater than 50% respiratory variability, suggesting right atrial pressure of 3 mmHg. IAS/Shunts: No atrial level shunt detected by color flow Doppler.  LEFT VENTRICLE PLAX 2D LVIDd:         4.10 cm   Diastology LVIDs:         2.50 cm   LV e' medial:    8.92 cm/s LV PW:         1.30 cm   LV E/e' medial:  5.6 LV IVS:        1.40 cm   LV e' lateral:   12.10 cm/s LVOT diam:     1.80 cm   LV E/e'  lateral: 4.1 LV SV:         55 LV SV Index:   29 LVOT Area:     2.54 cm  RIGHT VENTRICLE RV S prime:     15.10 cm/s TAPSE (M-mode): 2.4 cm LEFT ATRIUM              Index        RIGHT ATRIUM           Index LA diam:        3.10 cm  1.67 cm/m   RA Area:     17.30 cm LA Vol (A2C):   103.0 ml 55.48 ml/m  RA  Volume:   43.30 ml  23.32 ml/m LA Vol (A4C):   63.6 ml  34.25 ml/m LA Biplane Vol: 84.3 ml  45.40 ml/m  AORTIC VALVE AV Area (Vmax):    1.49 cm AV Area (Vmean):   1.59 cm AV Area (VTI):     1.55 cm AV Vmax:           198.75 cm/s AV Vmean:          121.250 cm/s AV VTI:            0.352 m AV Peak Grad:      15.8 mmHg AV Mean Grad:      7.5 mmHg LVOT Vmax:         116.00 cm/s LVOT Vmean:        75.700 cm/s LVOT VTI:          0.215 m LVOT/AV VTI ratio: 0.61  AORTA Ao Root diam: 3.90 cm MITRAL VALVE MV Area (PHT): 1.86 cm    SHUNTS MV Decel Time: 408 msec    Systemic VTI:  0.22 m MV E velocity: 49.60 cm/s  Systemic Diam: 1.80 cm MV A velocity: 53.40 cm/s MV E/A ratio:  0.93 Wilbert Bihari MD Electronically signed by Wilbert Bihari MD Signature Date/Time: 05/08/2024/6:31:11 PM    Final    IR TUNNELED CENTRAL VENOUS CATH [facility] IMG Result Date: 05/07/2024 INDICATION: ESRD requiring HD initiation EXAM: TUNNELED CENTRAL VENOUS HEMODIALYSIS CATHETER PLACEMENT WITH ULTRASOUND AND FLUOROSCOPIC GUIDANCE MEDICATIONS: Ancef  2 gm IV. The antibiotic was given in an appropriate time interval prior to skin puncture. ANESTHESIA/SEDATION: Local anesthetic and single agent sedation was employed during this procedure. A total of fentanyl  75 mcg was administered intravenously. The patient's level of consciousness and vital signs were monitored continuously by radiology nursing throughout the procedure under my direct supervision. FLUOROSCOPY: Radiation Exposure Index and estimated peak skin dose (PSD); Reference air kerma (RAK), 2.3 mGy. COMPLICATIONS: None immediate. PROCEDURE: Informed written consent was obtained from the patient and/or patient's representative after a discussion of the risks, benefits, and alternatives to treatment. Questions regarding the procedure were encouraged and answered. The RIGHT neck and chest were prepped with chlorhexidine  in a sterile fashion, and a sterile drape was applied covering the operative  field. Maximum barrier sterile technique with sterile gowns and gloves were used for the procedure. A timeout was performed prior to the initiation of the procedure. After creating a small venotomy incision, a micropuncture kit was utilized to access the internal jugular vein. Real-time ultrasound guidance was utilized for vascular access including the acquisition of a permanent ultrasound image documenting patency of the accessed vessel. The microwire was utilized to measure appropriate catheter length. A stiff Glidewire was advanced to the level of the IVC and the micropuncture sheath was exchanged for a peel-away sheath. A palindrome tunneled  hemodialysis catheter measuring 19 cm from tip to cuff was tunneled in a retrograde fashion from the anterior chest wall to the venotomy incision. The catheter was then placed through the peel-away sheath with tips ultimately positioned within the superior aspect of the right atrium. Final catheter positioning was confirmed and documented with a spot radiographic image. The catheter aspirates and flushes normally. The catheter was flushed with appropriate volume heparin  dwells. The catheter exit site was secured with a 0-Prolene retention suture. The venotomy incision was closed with Dermabond. Dressings were applied. The patient tolerated the procedure well without immediate post procedural complication. IMPRESSION: Successful placement of 19 cm tip to cuff tunneled hemodialysis catheter via the RIGHT internal jugular vein. The tip of the catheter is positioned at the superior cavo-atrial junction. The catheter is ready for immediate use. Thom Hall, MD [facility] Electronically Signed   By: Thom Hall M.D.   On: 05/07/2024 14:06   CT CHEST WO CONTRAST Result Date: 05/05/2024 EXAM: CT CHEST WITHOUT CONTRAST 05/05/2024 04:00:00 PM TECHNIQUE: CT of the chest was performed without the administration of  intravenous contrast. Multiplanar reformatted images are provided for review. Automated exposure control, iterative reconstruction, and/or weight based adjustment of the mA/kV was utilized to reduce the radiation dose to as low as reasonably achievable. COMPARISON: None available. CLINICAL HISTORY: renal mass, staging. FINDINGS: MEDIASTINUM: \Moderate coronary atherosclerosis of the LAD and right coronary artery. Pericardium is unremarkable. The central airways are clear. Mild thoracic atherosclerosis. LYMPH NODES: No mediastinal, hilar or axillary lymphadenopathy. LUNGS AND PLEURA: 2 left lower lobe nodules measuring 2-3 mm (images 97 and 110), technically indeterminate but likely benign. Trace bilateral pleural effusions. No focal consolidation or pulmonary edema. No pneumothorax. SOFT TISSUES/BONES: Moderate changes of the thoracic spine with mild dextroscoliosis. No acute abnormality of the soft tissues. UPPER ABDOMEN: Visualized upper abdomen is unchanged from recent CT abdomen and pelvis noting a large right renal mass. IMPRESSION: 1. Two 23 mm left lower lobe pulmonary nodules, technically indeterminate but likely benign. In the setting of known malignancy, consider follow-up CT chest in 3-6 months. 2. Trace bilateral pleural effusions. 3. Large right renal mass, unchanged from recent CT abdomen and pelvis. Electronically signed by: Pinkie Pebbles MD 05/05/2024 07:24 PM EDT RP Workstation: [id number]   CT ABDOMEN PELVIS WO CONTRAST Result Date: 05/04/2024 EXAM: CT ABDOMEN AND PELVIS WITHOUT CONTRAST 05/04/2024 07:16:37 PM TECHNIQUE: CT of the abdomen and pelvis was performed without the administration of intravenous contrast. Multiplanar reformatted images are provided for review. Automated exposure control, iterative reconstruction, and/or weight-based adjustment of the mA/kV was utilized to reduce the radiation dose to as low as reasonably achievable. COMPARISON: None available. CLINICAL HISTORY:  Abdominal mass, palpable. Patient reports a hardened mass on the right side of the abdomen, denies pain, first noticed Sunday. Normal bowel movements, last bowel movement this morning. FINDINGS: LOWER CHEST: No acute abnormality. LIVER: The liver is unremarkable. GALLBLADDER AND BILE DUCTS: Gallbladder is unremarkable. No biliary ductal dilatation. SPLEEN: No acute abnormality. PANCREAS: No acute abnormality. ADRENAL GLANDS: The adrenal glands are unremarkable. KIDNEYS, URETERS AND BLADDER: A 15.4 x 19.1 x 16.7 cm mass is seen involving the interpolar region of the right kidney, likely representing a primary renal cell carcinoma. Variable density is likely related to areas of central necrosis. Faint Dystrophic calcification is seen throughout the mass. The mass demonstrates endophytic and exophytic extension with mass effect upon the adjacent gallbladder and ascending colon without evidence of direct invasion,  as well as extension into the right renal pelvis without definite involvement of the right renal vein or proximal right ureter. No hydronephrosis of the right kidney is seen. The mass appears to extend beyond Gerota fascia laterally, where it abuts the peritoneal lining, with significant mass effect upon the inferior vena cava, which is slit-like in configuration. Multiple indeterminate exophytic cortical lesions are seen arising from the left kidney, measuring up to 15 mm, which are not well characterized on this noncontrast examination. No hydronephrosis on the left. No intrarenal or ureteral calculi. The bladder is partially obscured by artifact but is otherwise unremarkable. GI AND BOWEL: Appendix is normal. Stomach, small bowel, and large bowel are otherwise unremarkable. PERITONEUM AND RETROPERITONEUM: No ascites. No free air. VASCULATURE: The inferior vena cava is slit-like in configuration, best seen at image number 38, series number 2, without direct invasion of the structure. LYMPH NODES: No adjacent  pathologic adenopathy is identified. REPRODUCTIVE ORGANS: No acute abnormality. BONES AND SOFT TISSUES: Left total hip arthroplasty has been performed. Osseous structures are otherwise age-appropriate. No lytic or blastic bone lesions. IMPRESSION: 1. Large right renal mass (15.4 x 19.1 x 16.7 cm) with variable density, likely representing primary renal cell carcinoma with central necrosis. The mass demonstrates endophytic and exophytic extension, mass effect on the gallbladder and ascending colon without direct invasion, and extension into the right renal pelvis without definite involvement of the right renal vein or proximal right ureter. The mass also extends beyond Gerota fascia laterally, abutting the peritoneal lining, and causes significant mass effect on the inferior vena cava without direct invasion. No adjacent pathologic adenopathy or hydronephrosis. 2. Multiple indeterminate exophytic cortical lesions in the left kidney, measuring up to 15 mm, not well characterized on this noncontrast examination. Dedicated contrast-enhanced MRI examination is recommended for further evaluation. 3. Non-emergent CT examination of the chest is recommended for staging purposes. Electronically signed by: Dorethia Molt MD 05/04/2024 07:43 PM EDT RP Workstation: [id number]      Subjective:  Patient at the edge of the bed, denies any chest pain shortness of breath   Discharge Exam: Vitals:   05/11/24 0734 05/11/24 1218  BP: (!) 147/85 121/83  Pulse:    Resp:    Temp: 98.2 F (36.8 C) 98.5 F (36.9 C)  SpO2:     Vitals:   05/11/24 0000 05/11/24 0631 05/11/24 0734 05/11/24 1218  BP: (!) 140/71 (!) 149/92 (!) 147/85 121/83  Pulse: (!) 57 71    Resp: 11 (!) 22    Temp: 97.9 F (36.6 C) 98.5 F (36.9 C) 98.2 F (36.8 C) 98.5 F (36.9 C)  TempSrc: Axillary Oral Oral Oral  SpO2: 92% 96%    Weight:      Height:        General: Pt is alert, awake, not in acute distress Cardiovascular: RRR, S1/S2 +,  no rubs, no gallops Respiratory: CTA bilaterally, no wheezing, no rhonchi Abdominal: Soft, NT, ND, bowel sounds + Extremities: no edema, no cyanosis    The results of significant diagnostics from this hospitalization (including imaging, microbiology, ancillary and laboratory) are listed below for reference.     Microbiology: Recent Results (from the past 240 hours)  MRSA Next Gen by PCR, Nasal     Status: None   Collection Time: 05/08/24  7:41 AM   Specimen: Nasal Mucosa; Nasal Swab  Result Value Ref Range Status   MRSA by PCR Next Gen NOT DETECTED NOT DETECTED Final    Comment: (NOTE) The GeneXpert  MRSA Assay (FDA approved for NASAL specimens only), is one component of a comprehensive MRSA colonization surveillance program. It is not intended to diagnose MRSA infection nor to guide or monitor treatment for MRSA infections. Test performance is not FDA approved in patients less than 7 years old. Performed at [facility], [address] [place], [place] [address]      Labs: BNP (last 3 results) No results for input(s): BNP in the last 8760 hours. Basic Metabolic Panel: Recent Labs  Lab 05/06/24 0258 05/07/24 1223 05/08/24 0015 05/09/24 1933 05/10/24 0309 05/11/24 0258  NA 137 138  --  134* 134* 131*  K 3.9 3.8  --  3.5 3.4* 3.0*  CL 102 103  --  98 99 95*  CO2 16* 16*  --  21* 19* 23  GLUCOSE 83 122*  --  107* 96 114*  BUN 157* 158*  --  69* 72* 39*  CREATININE 15.64* 16.76*  --  9.69* 10.08* 6.85*  CALCIUM  9.1 9.5  --  9.3 9.4 9.4  MG  --   --  2.2 1.9  --   --   PHOS  --  7.4* 5.0* 4.7* 5.4* 3.9   Liver Function Tests: Recent Labs  Lab 05/04/24 1552 05/07/24 1223 05/09/24 1933 05/10/24 0309 05/11/24 0258  AST 15  --   --   --   --   ALT 6  --   --   --   --   ALKPHOS 84  --   --   --   --   BILITOT 0.5  --   --   --   --   PROT 7.5  --   --   --   --   ALBUMIN 4.4 3.6 3.5 3.4* 3.5   Recent Labs  Lab 05/04/24 1552  LIPASE 51   No  results for input(s): AMMONIA in the last 168 hours. CBC: Recent Labs  Lab 05/04/24 1552 05/06/24 0258 05/08/24 0359 05/09/24 0500 05/10/24 0309 05/11/24 0258  WBC 6.0 7.0 8.4 8.6 7.9 8.5  NEUTROABS 4.2  --   --   --   --   --   HGB 8.1* 8.6* 8.9* 9.2* 9.7* 9.6*  HCT 25.2* 26.4* 26.9* 28.4* 29.8* 29.5*  MCV 75.2* 74.4* 73.9* 74.5* 74.3* 75.4*  PLT 260 275 280 303 309 268   Cardiac Enzymes: No results for input(s): CKTOTAL, CKMB, CKMBINDEX, TROPONINI in the last 168 hours. BNP: Invalid input(s): POCBNP CBG: No results for input(s): GLUCAP in the last 168 hours. D-Dimer No results for input(s): DDIMER in the last 72 hours. Hgb A1c No results for input(s): HGBA1C in the last 72 hours. Lipid Profile No results for input(s): CHOL, HDL, LDLCALC, TRIG, CHOLHDL, LDLDIRECT in the last 72 hours. Thyroid function studies No results for input(s): TSH, T4TOTAL, T3FREE, THYROIDAB in the last 72 hours.  Invalid input(s): FREET3 Anemia work up No results for input(s): VITAMINB12, FOLATE, FERRITIN, TIBC, IRON, RETICCTPCT in the last 72 hours. Urinalysis    Component Value Date/Time   COLORURINE YELLOW 05/04/2024 1857   APPEARANCEUR CLEAR 05/04/2024 1857   LABSPEC 1.012 05/04/2024 1857   PHURINE 6.0 05/04/2024 1857   GLUCOSEU NEGATIVE 05/04/2024 1857   HGBUR NEGATIVE 05/04/2024 1857   BILIRUBINUR NEGATIVE 05/04/2024 1857   KETONESUR NEGATIVE 05/04/2024 1857   PROTEINUR 100 (A) 05/04/2024 1857   UROBILINOGEN 0.2 05/31/2012 1410   NITRITE NEGATIVE 05/04/2024 1857   LEUKOCYTESUR NEGATIVE 05/04/2024 1857   Sepsis Labs  Recent Labs  Lab 05/08/24 0359 05/09/24 0500 05/10/24 0309 05/11/24 0258  WBC 8.4 8.6 7.9 8.5   Microbiology Recent Results (from the past 240 hours)  MRSA Next Gen by PCR, Nasal     Status: None   Collection Time: 05/08/24  7:41 AM   Specimen: Nasal Mucosa; Nasal Swab  Result Value Ref Range Status   MRSA  by PCR Next Gen NOT DETECTED NOT DETECTED Final    Comment: (NOTE) The GeneXpert MRSA Assay (FDA approved for NASAL specimens only), is one component of a comprehensive MRSA colonization surveillance program. It is not intended to diagnose MRSA infection nor to guide or monitor treatment for MRSA infections. Test performance is not FDA approved in patients less than 1 years old. Performed at [facility], [address] [place], [place] [address]      Time coordinating discharge: Over 30 minutes  SIGNED:   Brayton Lye, MD  [facility] 05/11/2024, 2:53 PM Pager   If 7PM-7AM, please contact night-coverage [email or web address]

## 2024-05-11 NOTE — Progress Notes (Signed)
 PROGRESS NOTE                                                                                                                                                                                                             Marvin Garcia Demographics:    Marvin Garcia, is a 68 y.o. male, DOB - 11-07-1955, FMW:[id number]  Outpatient Primary MD for the Marvin Garcia is Marvin Garcia, No Pcp Per    LOS - 6  Admit date - 05/04/2024    Chief Complaint  Marvin Garcia presents with   Mass       Brief Narrative (HPI from H&P)    68 y.o. male with PMH of chronic kidney disease stage V, hypertension presented with abdominal discomfort and fullness to [facility] ED. He satarted noticing bulge on left abdomen since Sunday. Marvin Garcia otherwise denies any nausea, vomiting, chest pain, shortness of breath, fever, chills, headache, focal weakness, numbness tingling, speech difficulties. He has not had recetn weight loss, or night sweats or chills.  The workup in the ER suggested a large right sided renal mass suspicious for RCC with possible mets to the lungs.   Subjective:   Marvin Garcia at the edge of the bed, denies any chest pain shortness of breath   Assessment  & Plan :   Progressive CKD 5 vs AKI on CKD, Metabolic acidosis: Nephrology following, HD catheter was placed on 05/07/2024 by IR, HD was started on the same date.  Nephrology on board.  Will continue Monday Wednesday Friday HD schedule, next dialysis scheduled for outpatient on Friday.    Large right-sided renal mass likely RCC: Urology has been consulted and is outpatient resection once Marvin Garcia is discharged and dialysis has been initiated, likely in the next 2 to 3 weeks in the outpatient setting, will follow-up with Dr. Patrcia. -At this point we will hold on full anticoagulation to prevent a bleed in his RCC mass which will complicate and delay the surgery   New onset paroxysmal atrial fibrillation with RVR.   Noticed night of 05/07/2024, [place] vas 2 score 3.  Seen by cardiology on oral Cardizem and Coreg , anticoagulation after nephrectomy per cardiology, echo stable TSH stable, he had converted to sinus rhythm however went into A-fib RVR again night of 05/10/2024 requiring Cardizem drip, cardiology will titrate medications further they have been informed.  Will monitor. - Cardiology input  greatly appreciated, start on Cardizem CD 360 in a.m.  Anemia of renal disease: Monitor hemoglobin transfuse less than 7 g.   Hypertension: Currently placed on combination of Coreg , hydralazine , Imdur  and Cardizem.  Monitor and adjust.  Incidental finding of mild aortic root dilation on echo.  Follow-up with cardiology and PCP.  Beta-blocker if blood pressure tolerates.    New onset hiccups.  Baclofen and Thorazine  for now and monitor.    Lung nodules.  Repeat CT in 3 months follow-up with pulmonary postdischarge.      Condition -   Guarded  Family Communication  : None at bedside  Code Status :  Full  Consults  :  Renal, Urology, cardiology  PUD Prophylaxis :    Procedures  :     ECHO - 1. Left ventricular ejection fraction, by estimation, is 60 to 65%. The left ventricle has normal function. The left ventricle has no regional wall motion abnormalities. There is moderate concentric left ventricular hypertrophy. Left ventricular diastolic parameters were normal.  2. Right ventricular systolic function is normal. The right ventricular size is normal. Tricuspid regurgitation signal is inadequate for assessing PA pressure.  3. The mitral valve is degenerative. No evidence of mitral valve regurgitation. No evidence of mitral stenosis. Moderate mitral annular calcification.  4. The aortic valve is calcified. There is moderate calcification of the aortic valve. There is moderate thickening of the aortic valve. Aortic valve regurgitation is not visualized. Mild aortic valve stenosis. Aortic valve area, by VTI measures  1.55 cm. Aortic valve mean gradient measures 7.5 mmHg. Aortic valve Vmax measures 1.99 m/s.  5. The inferior vena cava is normal in size with greater than 50% respiratory variability, suggesting right atrial pressure of 3 mmHg.  6. Aortic dilatation noted. There is mild dilatation of the aortic root, measuring 39 mm.  Right IJ HD catheter placed by IR on 05/07/2024.    CT Chest - 1. Two 23 mm left lower lobe pulmonary nodules, technically indeterminate but likely benign. In the setting of known malignancy, consider follow-up CT chest in 3-6 months. 2. Trace bilateral pleural effusions. 3. Large right renal mass, unchanged from recent CT abdomen and pelvis.  CT Abd pelvis -   1. Large right renal mass (15.4 x 19.1 x 16.7 cm) with variable density, likely representing primary renal cell carcinoma with central necrosis. The mass demonstrates endophytic and exophytic extension, mass effect on the gallbladder and ascending colon without direct invasion, and extension into the right renal pelvis without definite involvement of the right renal vein or proximal right ureter. The mass also extends beyond Gerota fascia laterally, abutting the peritoneal lining, and causes significant mass effect on the inferior vena cava without direct invasion. No adjacent pathologic adenopathy or hydronephrosis. 2. Multiple indeterminate exophytic cortical lesions in the left kidney, measuring up to 15 mm, not well characterized on this noncontrast examination. Dedicated contrast-enhanced MRI examination is recommended for further evaluation. 3. Non-emergent CT examination of the chest is recommended for staging purposes      Disposition Plan  :    Status is: Inpatient   DVT Prophylaxis  :    heparin  injection 5,000 Units Start: 05/09/24 1400 SCDs Start: 05/05/24 1524    Lab Results  Component Value Date   PLT 268 05/11/2024    Diet :  Diet Order             Diet renal with fluid restriction Fluid restriction:  1500 mL Fluid; Room service appropriate? Yes;  Fluid consistency: Thin  Diet effective now                    Inpatient Medications  Scheduled Meds:  carvedilol   25 mg Oral BID WC   Chlorhexidine  Gluconate Cloth  6 each Topical Daily   Chlorhexidine  Gluconate Cloth  6 each Topical Q0600   [START ON 05/12/2024] diltiazem  360 mg Oral Daily   diltiazem  90 mg Oral Q6H   heparin  injection (subcutaneous)  5,000 Units Subcutaneous Q8H   hydrALAZINE   100 mg Oral TID   isosorbide  mononitrate  60 mg Oral Daily   senna-docusate  1 tablet Oral Once   Continuous Infusions:  chlorproMAZINE  (THORAZINE ) 25 mg in sodium chloride  0.9 % 25 mL IVPB 25 mg (05/10/24 1837)   PRN Meds:.acetaminophen  **OR** acetaminophen , baclofen, chlorproMAZINE  (THORAZINE ) 25 mg in sodium chloride  0.9 % 25 mL IVPB, diltiazem, hydrALAZINE , metoprolol tartrate, ondansetron  **OR** ondansetron  (ZOFRAN ) IV, senna-docusate  Antibiotics  :    Anti-infectives (From admission, onward)    None         Objective:   Vitals:   05/10/24 2000 05/11/24 0000 05/11/24 0631 05/11/24 0734  BP: (!) 145/96 (!) 140/71 (!) 149/92 (!) 147/85  Pulse: 64 (!) 57 71   Resp: 19 11 (!) 22   Temp:  97.9 F (36.6 C) 98.5 F (36.9 C) 98.2 F (36.8 C)  TempSrc:  Axillary Oral Oral  SpO2: 92% 92% 96%   Weight:      Height:        Wt Readings from Last 3 Encounters:  05/10/24 74 kg  04/07/23 87.1 kg  03/24/23 87.1 kg     Intake/Output Summary (Last 24 hours) at 05/11/2024 1204 Last data filed at 05/11/2024 1023 Gross per 24 hour  Intake --  Output 1000 ml  Net -1000 ml     Physical Exam  Awake Alert, No new F.N deficits, Normal affect, right IJ HD catheter in place [place].AT,PERRAL Symmetrical Chest wall movement, Good air movement bilaterally, CTAB RRR,No Gallops,Rubs or new Murmurs, No Parasternal Heave +ve B.Sounds, Abd Soft, No tenderness, No rebound - guarding or rigidity. No Cyanosis, Clubbing or edema, No new Rash  or bruise       Data Review:    Recent Labs  Lab 05/04/24 1552 05/06/24 0258 05/08/24 0359 05/09/24 0500 05/10/24 0309 05/11/24 0258  WBC 6.0 7.0 8.4 8.6 7.9 8.5  HGB 8.1* 8.6* 8.9* 9.2* 9.7* 9.6*  HCT 25.2* 26.4* 26.9* 28.4* 29.8* 29.5*  PLT 260 275 280 303 309 268  MCV 75.2* 74.4* 73.9* 74.5* 74.3* 75.4*  MCH 24.2* 24.2* 24.5* 24.1* 24.2* 24.6*  MCHC 32.1 32.6 33.1 32.4 32.6 32.5  RDW 18.4* 18.8* 19.1* 19.9* 19.6* 19.9*  LYMPHSABS 0.8  --   --   --   --   --   MONOABS 0.7  --   --   --   --   --   EOSABS 0.1  --   --   --   --   --   BASOSABS 0.0  --   --   --   --   --     Recent Labs  Lab 05/04/24 1552 05/06/24 0258 05/07/24 1223 05/08/24 0015 05/09/24 1933 05/10/24 0309 05/11/24 0258  NA 139 137 138  --  134* 134* 131*  K 4.4 3.9 3.8  --  3.5 3.4* 3.0*  CL 99 102 103  --  98 99 95*  CO2 16* 16*  16*  --  21* 19* 23  ANIONGAP 24* 19* 19*  --  15 16* 13  GLUCOSE 109* 83 122*  --  107* 96 114*  BUN 149* 157* 158*  --  69* 72* 39*  CREATININE 14.20* 15.64* 16.76*  --  9.69* 10.08* 6.85*  AST 15  --   --   --   --   --   --   ALT 6  --   --   --   --   --   --   ALKPHOS 84  --   --   --   --   --   --   BILITOT 0.5  --   --   --   --   --   --   ALBUMIN 4.4  --  3.6  --  3.5 3.4* 3.5  INR  --  1.2  --   --   --   --   --   TSH  --   --   --  2.816  --   --   --   MG  --   --   --  2.2 1.9  --   --   PHOS  --   --  7.4* 5.0* 4.7* 5.4* 3.9  CALCIUM  9.7 9.1 9.5  --  9.3 9.4 9.4      Recent Labs  Lab 05/06/24 0258 05/07/24 1223 05/08/24 0015 05/09/24 1933 05/10/24 0309 05/11/24 0258  INR 1.2  --   --   --   --   --   TSH  --   --  2.816  --   --   --   MG  --   --  2.2 1.9  --   --   CALCIUM  9.1 9.5  --  9.3 9.4 9.4    --------------------------------------------------------------------------------------------------------------- No results found for: CHOL, HDL, LDLCALC, LDLDIRECT, TRIG, CHOLHDL  No results found for: HGBA1C No  results for input(s): TSH, T4TOTAL, FREET4, T3FREE, THYROIDAB in the last 72 hours.  No results for input(s): VITAMINB12, FOLATE, FERRITIN, TIBC, IRON, RETICCTPCT in the last 72 hours. ------------------------------------------------------------------------------------------------------------------ Cardiac Enzymes No results for input(s): CKMB, TROPONINI, MYOGLOBIN in the last 168 hours.  Invalid input(s): CK  Micro Results Recent Results (from the past 240 hours)  MRSA Next Gen by PCR, Nasal     Status: None   Collection Time: 05/08/24  7:41 AM   Specimen: Nasal Mucosa; Nasal Swab  Result Value Ref Range Status   MRSA by PCR Next Gen NOT DETECTED NOT DETECTED Final    Comment: (NOTE) The GeneXpert MRSA Assay (FDA approved for NASAL specimens only), is one component of a comprehensive MRSA colonization surveillance program. It is not intended to diagnose MRSA infection nor to guide or monitor treatment for MRSA infections. Test performance is not FDA approved in patients less than 10 years old. Performed at [facility], [address] [place], [place] [address]     Radiology Report No results found.    Signature  -   Brayton Lye M.D on 05/11/2024 at 12:04 PM   -  To page go to [email or web address]

## 2024-05-11 NOTE — Progress Notes (Signed)
 Mobility Specialist Progress Note:   05/11/24 1230  Mobility  Activity Ambulated with assistance  Level of Assistance Standby assist, set-up cues, supervision of patient - no hands on  Assistive Device None;Other (Comment) (hallway rails)  Distance Ambulated (ft) 100 ft  Activity Response Tolerated well  Mobility Referral Yes  Mobility visit 1 Mobility  Mobility Specialist Start Time (ACUTE ONLY) 1230  Mobility Specialist Stop Time (ACUTE ONLY) 1245  Mobility Specialist Time Calculation (min) (ACUTE ONLY) 15 min   Pt agreeable to mobility session. Required only supervision for ambulation. No overt LOB noted. Distance limited by decr endurance. Requesting to turn around at 78ft. Denies SOB, HR 90s throughout. Left sitting EOB with all needs met.   Therisa Rana Mobility Specialist Please contact via SecureChat or  Rehab office at [phone]

## 2024-05-11 NOTE — Progress Notes (Signed)
 Rounding Note   Patient Name: Marvin Garcia Date of Encounter: 05/11/2024  [facility] Cardiologist: Redell Shallow, MD   Subjective -NAEON - Patient is now in NSR! - Patient is asymptomatic denying chest pain, SOB, palpitations, swelling, PND and orthopnea and bleeding  Scheduled Meds:  carvedilol   25 mg Oral BID WC   Chlorhexidine  Gluconate Cloth  6 each Topical Daily   Chlorhexidine  Gluconate Cloth  6 each Topical Q0600   diltiazem  90 mg Oral Q6H   heparin  injection (subcutaneous)  5,000 Units Subcutaneous Q8H   hydrALAZINE   100 mg Oral TID   isosorbide  mononitrate  60 mg Oral Daily   Continuous Infusions:  chlorproMAZINE  (THORAZINE ) 25 mg in sodium chloride  0.9 % 25 mL IVPB 25 mg (05/10/24 1837)   PRN Meds: acetaminophen  **OR** acetaminophen , baclofen, chlorproMAZINE  (THORAZINE ) 25 mg in sodium chloride  0.9 % 25 mL IVPB, diltiazem, hydrALAZINE , metoprolol tartrate, ondansetron  **OR** ondansetron  (ZOFRAN ) IV, senna-docusate   Vital Signs  Vitals:   05/10/24 2000 05/11/24 0000 05/11/24 0631 05/11/24 0734  BP: (!) 145/96 (!) 140/71 (!) 149/92 (!) 147/85  Pulse: 64 (!) 57 71   Resp: 19 11 (!) 22   Temp:  97.9 F (36.6 C) 98.5 F (36.9 C) 98.2 F (36.8 C)  TempSrc:  Axillary Oral Oral  SpO2: 92% 92% 96%   Weight:      Height:        Intake/Output Summary (Last 24 hours) at 05/11/2024 1049 Last data filed at 05/11/2024 1023 Gross per 24 hour  Intake --  Output 1000 ml  Net -1000 ml      05/10/2024    4:35 PM 05/10/2024    1:21 PM 05/09/2024    8:30 AM  Last 3 Weights  Weight (lbs) 163 lb 2.3 oz 164 lb 0.4 oz 165 lb 12.6 oz  Weight (kg) 74 kg 74.4 kg 75.2 kg      Telemetry NSR, occasional PVCs, 1 run of NSVT- Personally Reviewed  ECG  No new ECG  Physical Exam  GEN: Well-appearing male in NAD  Neck: No JVD Cardiac: RRR, II/VI systolic murmur heard throughout the precordium no rubs or gallops. R perm cath in place Respiratory: Clear to  auscultation bilaterally. GI: Soft, nontender, non-distended  MS: No edema; No deformity. Neuro:  Nonfocal  Psych: Normal affect   Labs High Sensitivity Troponin:   Recent Labs  Lab 05/08/24 0015 05/08/24 0359  TROPONINIHS 266* 275*     Chemistry Recent Labs  Lab 05/04/24 1552 05/06/24 0258 05/08/24 0015 05/09/24 1933 05/10/24 0309 05/11/24 0258  NA 139   < >  --  134* 134* 131*  K 4.4   < >  --  3.5 3.4* 3.0*  CL 99   < >  --  98 99 95*  CO2 16*   < >  --  21* 19* 23  GLUCOSE 109*   < >  --  107* 96 114*  BUN 149*   < >  --  69* 72* 39*  CREATININE 14.20*   < >  --  9.69* 10.08* 6.85*  CALCIUM  9.7   < >  --  9.3 9.4 9.4  MG  --   --  2.2 1.9  --   --   PROT 7.5  --   --   --   --   --   ALBUMIN 4.4   < >  --  3.5 3.4* 3.5  AST 15  --   --   --   --   --  ALT 6  --   --   --   --   --   ALKPHOS 84  --   --   --   --   --   BILITOT 0.5  --   --   --   --   --   GFRNONAA 3*   < >  --  5* 5* 8*  ANIONGAP 24*   < >  --  15 16* 13   < > = values in this interval not displayed.    Lipids No results for input(s): CHOL, TRIG, HDL, LABVLDL, LDLCALC, CHOLHDL in the last 168 hours.  Hematology Recent Labs  Lab 05/09/24 0500 05/10/24 0309 05/11/24 0258  WBC 8.6 7.9 8.5  RBC 3.81* 4.01* 3.91*  HGB 9.2* 9.7* 9.6*  HCT 28.4* 29.8* 29.5*  MCV 74.5* 74.3* 75.4*  MCH 24.1* 24.2* 24.6*  MCHC 32.4 32.6 32.5  RDW 19.9* 19.6* 19.9*  PLT 303 309 268   Thyroid  Recent Labs  Lab 05/08/24 0015  TSH 2.816  FREET4 0.86    BNPNo results for input(s): BNP, PROBNP in the last 168 hours.  DDimer No results for input(s): DDIMER in the last 168 hours.   Radiology  No results found.  Cardiac Studies No new cardiac studies  Patient Profile   Marvin Garcia is a 68 y.o. male with a PMH of HTN and ESRD who presented with abdominal pain secondary to a large renal mass. Subsequently developed atrial fibrillation. Cardiology is consulted for management of A-fib.    Assessment & Plan    #Newly Diagnosed PAF :: Patient converted to NSR overnight.  He tolerated his first trial of the higher dose fractionated p.o. diltiazem 90 mg every 6h.  He has plenty of blood pressure room as well.  I will let him continue his fractionated diltiazem today and then transition to sustained-release diltiazem tomorrow.  Still plan to hold systemic anticoagulation until a definitive surgical plan is made. - Consolidate diltiazem to 360 mg p.o. daily starting tomorrow -Continue carvedilol  25 mg twice daily - Maintain telemetry - Daily renal function panels and magnesium  for electrolytes -Holding systemic anticoagulation pending surgical evaluation   #HTN :: BPs are stable. - Continue Coreg , hydralazine , Imdur    #ESRD - Dialysis per nephrology     [facility] will sign off.   The patient is ready for discharge from a cardiac standpoint. Medication Recommendations: Continue diltiazem 360 mg p.o. daily sustained-release Other recommendations (labs, testing, etc):  N/A Follow up as an outpatient: Cardiology to arrange outpatient follow-up For questions or updates, please contact [facility] Please consult [email or web address] for contact info under       Signed, Marvin Archer, MD  05/11/2024, 10:49 AM

## 2024-05-11 NOTE — Discharge Instructions (Signed)
 Follow with Primary MD  in 7 days   Get CBC, CMP, checked  by Primary MD next visit.    Activity: As tolerated with Full fall precautions use walker/cane & assistance as needed   Disposition Home   Diet:  renal diet, heart healthy with 1.2 L fluid restriction  On your next visit with your primary care physician please Get Medicines reviewed and adjusted.   Please request your Prim.MD to go over all Hospital Tests and Procedure/Radiological results at the follow up, please get all Hospital records sent to your Prim MD by signing hospital release before you go home.   If you experience worsening of your admission symptoms, develop shortness of breath, life threatening emergency, suicidal or homicidal thoughts you must seek medical attention immediately by calling 911 or calling your MD immediately  if symptoms less severe.  You Must read complete instructions/literature along with all the possible adverse reactions/side effects for all the Medicines you take and that have been prescribed to you. Take any new Medicines after you have completely understood and accpet all the possible adverse reactions/side effects.   Do not drive, operating heavy machinery, perform activities at heights, swimming or participation in water  activities or provide baby sitting services if your were admitted for syncope or siezures until you have seen by Primary MD or a Neurologist and advised to do so again.  Do not drive when taking Pain medications.    Do not take more than prescribed Pain, Sleep and Anxiety Medications  Special Instructions: If you have smoked or chewed Tobacco  in the last 2 yrs please stop smoking, stop any regular Alcohol  and or any Recreational drug use.  Wear Seat belts while driving.   Please note  You were cared for by a hospitalist during your hospital stay. If you have any questions about your discharge medications or the care you received while you were in the hospital after  you are discharged, you can call the unit and asked to speak with the hospitalist on call if the hospitalist that took care of you is not available. Once you are discharged, your primary care physician will handle any further medical issues. Please note that NO REFILLS for any discharge medications will be authorized once you are discharged, as it is imperative that you return to your primary care physician (or establish a relationship with a primary care physician if you do not have one) for your aftercare needs so that they can reassess your need for medications and monitor your lab values.

## 2024-05-11 NOTE — Progress Notes (Signed)
 Discharge  Pt verbally understanding discharge instructions.  TOC med ready.  Pt alert and oriented in good spirits.  PIV's removed, tele removed CCMD/Jennifer.    Discharging to lounge.  Pt will call son.  Transport called.

## 2024-05-12 ENCOUNTER — Telehealth: Payer: Self-pay | Admitting: Nephrology

## 2024-05-12 NOTE — Telephone Encounter (Signed)
 Transition of care contact from inpatient facility  Date of Discharge: 05/11/24 Date of Contact: 05/12/24 Method of contact: Phone  Attempted to contact patient to discuss transition of care from inpatient admission. Patient did not answer the phone. Will continue outreach attempts.

## 2024-05-17 ENCOUNTER — Inpatient Hospital Stay (HOSPITAL_COMMUNITY): Admission: RE | Admit: 2024-05-17 | Source: Ambulatory Visit

## 2024-05-24 ENCOUNTER — Other Ambulatory Visit: Payer: Self-pay | Admitting: Vascular Surgery

## 2024-05-24 DIAGNOSIS — N179 Acute kidney failure, unspecified: Secondary | ICD-10-CM

## 2024-05-31 ENCOUNTER — Encounter (HOSPITAL_COMMUNITY)

## 2024-06-01 ENCOUNTER — Ambulatory Visit: Admitting: Physician Assistant

## 2024-06-01 NOTE — Progress Notes (Signed)
[facility] Pre-Operative Teaching    Pre-operative teaching completed and patient/caregiver verbalized understanding. Provided with copy of the following information for patients:    "Information for Surgical Patients"  "Pre-Operative Screening Unit (Clinic 2D) Information"   Map of [facility] and [facility], Local Hotels and Pharmacies  "Before Your Surgery at [facility]"  "Medications That Increase Risk of Bleeding"  "Enhanced Recovery After Nephrectomy Surgery- Your Path to Healing"

## 2024-06-05 ENCOUNTER — Other Ambulatory Visit: Payer: Self-pay

## 2024-06-05 ENCOUNTER — Encounter (HOSPITAL_COMMUNITY): Payer: Self-pay

## 2024-06-05 ENCOUNTER — Emergency Department (HOSPITAL_COMMUNITY)
Admission: EM | Admit: 2024-06-05 | Discharge: 2024-06-06 | Disposition: A | Attending: Emergency Medicine | Admitting: Emergency Medicine

## 2024-06-05 DIAGNOSIS — R339 Retention of urine, unspecified: Secondary | ICD-10-CM | POA: Insufficient documentation

## 2024-06-05 LAB — URINALYSIS, ROUTINE W REFLEX MICROSCOPIC
Bacteria, UA: NONE SEEN
Bilirubin Urine: NEGATIVE
Glucose, UA: 50 mg/dL — AB
Hgb urine dipstick: NEGATIVE
Ketones, ur: NEGATIVE mg/dL
Leukocytes,Ua: NEGATIVE
Nitrite: NEGATIVE
Protein, ur: >=300 mg/dL — AB
Specific Gravity, Urine: 1.007 (ref 1.005–1.030)
pH: 9 — ABNORMAL HIGH (ref 5.0–8.0)

## 2024-06-05 LAB — BASIC METABOLIC PANEL WITH GFR
Anion gap: 17 — ABNORMAL HIGH (ref 5–15)
BUN: 51 mg/dL — ABNORMAL HIGH (ref 8–23)
CO2: 20 mmol/L — ABNORMAL LOW (ref 22–32)
Calcium: 10 mg/dL (ref 8.9–10.3)
Chloride: 103 mmol/L (ref 98–111)
Creatinine, Ser: 7.47 mg/dL — ABNORMAL HIGH (ref 0.61–1.24)
GFR, Estimated: 7 mL/min — ABNORMAL LOW (ref >60)
Glucose, Bld: 105 mg/dL — ABNORMAL HIGH (ref 70–99)
Potassium: 4 mmol/L (ref 3.5–5.1)
Sodium: 140 mmol/L (ref 135–145)

## 2024-06-05 LAB — CBC
HCT: 24.9 % — ABNORMAL LOW (ref 39.0–52.0)
Hemoglobin: 8 g/dL — ABNORMAL LOW (ref 13.0–17.0)
MCH: 25 pg — ABNORMAL LOW (ref 26.0–34.0)
MCHC: 32.1 g/dL (ref 30.0–36.0)
MCV: 77.8 fL — ABNORMAL LOW (ref 80.0–100.0)
Platelets: 317 K/uL (ref 150–400)
RBC: 3.2 MIL/uL — ABNORMAL LOW (ref 4.22–5.81)
RDW: 19.1 % — ABNORMAL HIGH (ref 11.5–15.5)
WBC: 10.6 K/uL — ABNORMAL HIGH (ref 4.0–10.5)
nRBC: 0.2 % (ref 0.0–0.2)

## 2024-06-05 NOTE — ED Triage Notes (Signed)
 Pt reports urinary retention x 2 days, only able to urinate in drops since; pt recently went on dialysis and states he is scheduled to have right kidney removed, pain 10/10

## 2024-06-05 NOTE — ED Notes (Signed)
 Pt 10/10 bladder pain, unable to void since Friday. Bladder scan in triage showed  greater than . Inserted foley cath per standing orders. Immediate return of golden yellow urine

## 2024-06-05 NOTE — ED Triage Notes (Signed)
 PT arrived from home via POV c/o inability to void. Pt states that he has not voided well since Friday and that only small spurts have been coming out. Pt c/o pain 10/10 pain.

## 2024-06-06 NOTE — ED Notes (Signed)
 Education provided regarding switching foley catheter bags and emptying bag, pt verbalized understanding, urine bag and urinal provided

## 2024-06-06 NOTE — ED Provider Notes (Signed)
 [place] EMERGENCY DEPARTMENT AT [facility] Provider Note   CSN: [id number] Arrival date & time: 06/05/24  2025     Patient presents with: Urinary Retention   Marvin Garcia. is a 68 y.o. male.   Patient presents with complaints of inability to pass his urine.  Patient complaining of pain and feeling like he needs to urinate but he cannot.       Prior to Admission medications   Medication Sig Start Date End Date Taking? Authorizing Provider  acetaminophen  (TYLENOL ) 325 MG tablet Take 2 tablets (650 mg total) by mouth every 6 (six) hours as needed for mild pain (pain score 1-3) or fever (or Fever >/= 101). 05/11/24   Elgergawy, Brayton RAMAN, MD  carvedilol  (COREG ) 25 MG tablet Take 25 mg by mouth 2 (two) times daily with a meal.    [provider]  Coenzyme Q10 (COQ-10) 100 MG CAPS Take 100 mg by mouth daily.    [provider]  diltiazem (CARDIZEM CD) 360 MG 24 hr capsule Take 1 capsule (360 mg total) by mouth daily. 05/12/24   Elgergawy, Brayton RAMAN, MD  Febuxostat  80 MG TABS Take 80 mg by mouth daily.     [provider]  hydrALAZINE  (APRESOLINE ) 100 MG tablet Take 100 mg by mouth 3 (three) times daily.    [provider]  isosorbide  mononitrate (IMDUR ) 30 MG 24 hr tablet Take 30 mg by mouth daily. 09/08/19   [provider]  Multiple Vitamin (MULTIVITAMIN WITH MINERALS) TABS tablet Take 1 tablet by mouth daily. *alive mens 50+*    [provider]  Omega-3 Fatty Acids (OMEGA 3 500) 500 MG CAPS Take 500 mg by mouth daily.    [provider]  OVER THE COUNTER MEDICATION Take 1 tablet by mouth daily. **vitra-cq10-100 mg*    [provider]  OVER THE COUNTER MEDICATION Take 1 tablet by mouth daily. **ultimate iron 50 mg**    [provider]    Allergies: Hydrocodone    Review of Systems  Updated Vital Signs BP (!) 174/98 (BP Location: Right Arm)   Pulse 80   Temp 98.7 F (37.1 C) (Oral)    Resp 18   Ht 5' 7 (1.702 m)   Wt 74.8 kg   SpO2 97%   BMI 25.84 kg/m   Physical Exam Vitals and nursing note reviewed.  Constitutional:      General: He is not in acute distress.    Appearance: He is well-developed.  HENT:     Head: Normocephalic and atraumatic.     Mouth/Throat:     Mouth: Mucous membranes are moist.  Eyes:     General: Vision grossly intact. Gaze aligned appropriately.     Extraocular Movements: Extraocular movements intact.     Conjunctiva/sclera: Conjunctivae normal.  Cardiovascular:     Rate and Rhythm: Normal rate and regular rhythm.     Pulses: Normal pulses.     Heart sounds: Normal heart sounds, S1 normal and S2 normal. No murmur heard.    No friction rub. No gallop.  Pulmonary:     Effort: Pulmonary effort is normal. No respiratory distress.     Breath sounds: Normal breath sounds.  Abdominal:     Palpations: Abdomen is soft.     Tenderness: There is no abdominal tenderness. There is no guarding or rebound.     Hernia: No hernia is present.  Musculoskeletal:        General: No swelling.  Cervical back: Full passive range of motion without pain, normal range of motion and neck supple. No pain with movement, spinous process tenderness or muscular tenderness. Normal range of motion.     Right lower leg: No edema.     Left lower leg: No edema.  Skin:    General: Skin is warm and dry.     Capillary Refill: Capillary refill takes less than 2 seconds.     Findings: No ecchymosis, erythema, lesion or wound.  Neurological:     Mental Status: He is alert and oriented to person, place, and time.     GCS: GCS eye subscore is 4. GCS verbal subscore is 5. GCS motor subscore is 6.     Cranial Nerves: Cranial nerves 2-12 are intact.     Sensory: Sensation is intact.     Motor: Motor function is intact. No weakness or abnormal muscle tone.     Coordination: Coordination is intact.  Psychiatric:        Mood and Affect: Mood normal.        Speech:  Speech normal.        Behavior: Behavior normal.     (all labs ordered are listed, but only abnormal results are displayed) Labs Reviewed  URINALYSIS, ROUTINE W REFLEX MICROSCOPIC - Abnormal; Notable for the following components:      Result Value   pH 9.0 (*)    Glucose, UA 50 (*)    Protein, ur >=300 (*)    All other components within normal limits  BASIC METABOLIC PANEL WITH GFR - Abnormal; Notable for the following components:   CO2 20 (*)    Glucose, Bld 105 (*)    BUN 51 (*)    Creatinine, Ser 7.47 (*)    GFR, Estimated 7 (*)    Anion gap 17 (*)    All other components within normal limits  CBC - Abnormal; Notable for the following components:   WBC 10.6 (*)    RBC 3.20 (*)    Hemoglobin 8.0 (*)    HCT 24.9 (*)    MCV 77.8 (*)    MCH 25.0 (*)    RDW 19.1 (*)    All other components within normal limits    EKG: None  Radiology: No results found.   Procedures   Medications Ordered in the ED - No data to display                                  Medical Decision Making Amount and/or Complexity of Data Reviewed Labs: ordered.   Patient presents with acute urinary retention.  He had a Foley catheter placed in triage before I evaluated him and had immediate relief of his pain.  Bladder was completely drained.  Patient tells me that he is due for right nephrectomy at [facility] later this week.  Will discharge with catheter in place and can follow-up with his physicians at [facility] to determine when it needs to be removed.     Final diagnoses:  Urinary retention    ED Discharge Orders     None          Elya Diloreto, Lonni PARAS, MD 06/06/24 [phone]

## 2024-06-06 NOTE — ED Provider Notes (Incomplete)
 [place] EMERGENCY DEPARTMENT AT [facility] Provider Note   CSN: [id number] Arrival date & time: 06/05/24  2025     Patient presents with: Urinary Retention   Marvin Garcia. is a 68 y.o. male.  {Add pertinent medical, surgical, social history, OB history to HPI:32947} HPI     Prior to Admission medications   Medication Sig Start Date End Date Taking? Authorizing Provider  acetaminophen  (TYLENOL ) 325 MG tablet Take 2 tablets (650 mg total) by mouth every 6 (six) hours as needed for mild pain (pain score 1-3) or fever (or Fever >/= 101). 05/11/24   Elgergawy, Brayton RAMAN, MD  carvedilol  (COREG ) 25 MG tablet Take 25 mg by mouth 2 (two) times daily with a meal.    [provider]  Coenzyme Q10 (COQ-10) 100 MG CAPS Take 100 mg by mouth daily.    [provider]  diltiazem (CARDIZEM CD) 360 MG 24 hr capsule Take 1 capsule (360 mg total) by mouth daily. 05/12/24   Elgergawy, Brayton RAMAN, MD  Febuxostat  80 MG TABS Take 80 mg by mouth daily.     [provider]  hydrALAZINE  (APRESOLINE ) 100 MG tablet Take 100 mg by mouth 3 (three) times daily.    [provider]  isosorbide  mononitrate (IMDUR ) 30 MG 24 hr tablet Take 30 mg by mouth daily. 09/08/19   [provider]  Multiple Vitamin (MULTIVITAMIN WITH MINERALS) TABS tablet Take 1 tablet by mouth daily. *alive mens 50+*    [provider]  Omega-3 Fatty Acids (OMEGA 3 500) 500 MG CAPS Take 500 mg by mouth daily.    [provider]  OVER THE COUNTER MEDICATION Take 1 tablet by mouth daily. **vitra-cq10-100 mg*    [provider]  OVER THE COUNTER MEDICATION Take 1 tablet by mouth daily. **ultimate iron 50 mg**    [provider]    Allergies: Hydrocodone    Review of Systems  Updated Vital Signs BP (!) 174/98 (BP Location: Right Arm)   Pulse 80   Temp 98.7 F (37.1 C) (Oral)   Resp 18   Ht 5' 7 (1.702 m)   Wt 74.8 kg   SpO2 97%   BMI 25.84  kg/m   Physical Exam  (all labs ordered are listed, but only abnormal results are displayed) Labs Reviewed  URINALYSIS, ROUTINE W REFLEX MICROSCOPIC - Abnormal; Notable for the following components:      Result Value   pH 9.0 (*)    Glucose, UA 50 (*)    Protein, ur >=300 (*)    All other components within normal limits  BASIC METABOLIC PANEL WITH GFR - Abnormal; Notable for the following components:   CO2 20 (*)    Glucose, Bld 105 (*)    BUN 51 (*)    Creatinine, Ser 7.47 (*)    GFR, Estimated 7 (*)    Anion gap 17 (*)    All other components within normal limits  CBC - Abnormal; Notable for the following components:   WBC 10.6 (*)    RBC 3.20 (*)    Hemoglobin 8.0 (*)    HCT 24.9 (*)    MCV 77.8 (*)    MCH 25.0 (*)    RDW 19.1 (*)    All other components within normal limits    EKG: None  Radiology: No results found.  {Document cardiac monitor, telemetry assessment procedure when appropriate:32947} Procedures   Medications Ordered in the ED - No data to  display    {Click here for ABCD2, HEART and other calculators REFRESH Note before signing:1}                              Medical Decision Making Amount and/or Complexity of Data Reviewed Labs: ordered.   ***  {Document critical care time when appropriate  Document review of labs and clinical decision tools ie [id number], etc  Document your independent review of radiology images and any outside records  Document your discussion with family members, caretakers and with consultants  Document social determinants of health affecting pt's care  Document your decision making why or why not admission, treatments were needed:32947:::1}   Final diagnoses:  None    ED Discharge Orders     None

## 2024-06-10 NOTE — Discharge Summary (Signed)
 Discharge Summary   Admit Date: 06/09/2024 Discharge Date: 06/13/2024 Admitting Physician: Ozell Bernardino Hahn, MD  Discharge Physician: Hahn Ozell Bernardino, MD  Primary Care Provider: Auston Opal, DO  Admission Diagnoses:  Renal mass, right [N28.89]  Discharge Diagnoses:  Renal mass, right [N28.89] Resolved Problems:   * No resolved hospital problems. 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   05/30/2024 - Present    Secondary hyperparathyroidism of renal origin (HHS-HCC) N25.81   05/26/2024 - Present    Iron deficiency anemia, unspecified D50.9   05/19/2024 - Present    End-stage renal disease (CMS/HHS-HCC) N18.6   05/12/2024 - Present    Hypertensive chronic kidney disease with stage 5 chronic kidney disease or end stage renal disease (CMS/HHS-HCC) I12.0   05/10/2024 - Present    Anemia due to stage 5 chronic kidney disease treated with erythropoietin (CMS/HHS-HCC) N18.5, D63.1   04/15/2024 - Present    History of total hip arthroplasty Z96.649   04/08/2018 - Present    Essential hypertension I10   09/30/2017 - Present    No additional problems / co-morbidities  Consult Orders: IP CONSULT TO NEPHROLOGY   Procedures Performed:   None  Surgeries Performed: Procedure(s): NEPHRECTOMY, RADICAL WITH REGIONAL LYMPHADENECTOMY AND/OR VENA CAVALTHROMBECTOMY  Brief History of Present Illness: Marvin Garcia. is a 68 y.o. male who presented as pre-admit on  10/30 for his right nephrectomy with Dr. Hahn on 10/31. Patient felt a large bump in his right side and hade 100lbs weight loss over last three years and went to ER for evaluation. CT scan at that time identified  large 15.4 x 19.1 x 16.7 cm right renal mass. Patient was referred to [facility] for surgical evaluation and scheduled for right nephrectomy on 10/31 with pre-admission the day prior to receive CT scan with IV contrast and HD.    Patient after Friday dialysis experienced urinary retention only driblling Fri-Sun not having adequate voids and emptying. Presented to his local ED for evaluation and found to have >671mL in his bladder. ED placed foley catheter and told him to keep it until his surgery when his urologists will evaluate him.   Hospital Course: The patient was admitted 10/30 to Urology service. He received CT scan. 10/31 he received 2u pRBC prior to OR for Hgb of 6.8. He went to hemodialysis morning of surgery. 10/31 the patient was taken to the OR for open right nephrectomy. The procedure was performed by Dr. Hahn, Ozell Bernardino, MD on 10/31. Full details can be found in the operative report. He tolerated the procedure well, and the patient was taken to the PACU, and admitted to the Urology service in stable condition.   His post operative course was as expected and uneventful. He had a little bit of an extended stay due to needing more time with physical therapy in order to be cleared for discharge.  On 06/13/24, PT released and cleared him for d/c home. He was tolerating food and liquids at time of discharge. He was ambulating in the hallways.  His pain was acceptable on oral medications.  After his foley was removed on 11/1, he was having issues with urinary retention and was not able to void after foley removal.  He was retaining bladder volumes of up to that were requiring clean intermittent catheterization. On the day of discharge, he received a hemodialysis treatment and  afterwards, patient unsuccessfully attempted to void with a PVR of . Nursing attempted to teach him how to perform clean intermittent catheterization. He was unable to perform this independently, so a foley catheter was reinserted and he will be sent home with a foley in place. He will return to clinic in a few days to have a trial of void. He was deemed medically stable and is being discharged home.  Discharge Exam:    Breathing comfortably with no use of accessory muscles   Abdomen is soft, appropriately tender    Incision is C/D/I, no s/s of infection    There is no sign of DVT   Foley catheter in place, urine is clear yellow    There are no focal neurologic deficits and patient is alert and oriented   Discharge Medications:    Medication List     START taking these medications    lidocaine  4 % patch Commonly known as: SALONPAS Place 1 patch onto the skin daily for 10 doses Apply patch to the most painful area for up to 12 hours in a 24 hours period.   ondansetron  4 MG disintegrating tablet Commonly known as: ZOFRAN -ODT Take 1 tablet (4 mg total) by mouth every 8 (eight) hours as needed for Nausea or Vomiting for up to 7 days   oxyCODONE  5 MG immediate release tablet Commonly known as: ROXICODONE  Take 1 tablet (5 mg total) by mouth every 6 (six) hours as needed for Pain (for severe pain) for up to 10 days   polyethylene glycol powder Commonly known as: MIRALAX  Take 17 grams by mouth once daily for 10 doses Mix in 4-8ounces of fluid prior to taking.       CONTINUE taking these medications    ALIVE MEN'S ENERGY ORAL   amLODIPine  10 MG tablet Commonly known as: NORVASC    ascorbic acid  (vitamin C ) 1000 MG tablet Commonly known as: VITAMIN C    atorvastatin  20 MG tablet Commonly known as: LIPITOR   carvediloL  25 MG tablet Commonly known as: COREG    cloNIDine  HCL 0.3 MG tablet Commonly known as: CATAPRES    CO Q-10 100 mg capsule Generic drug: co-enzyme Q-10  (ubiquinone)   Febuxostat  80 mg tablet Commonly known as: ULORIC    fluticasone propionate 50 mcg/actuation nasal spray Commonly known as: FLONASE   Herbal Supplement   hydrALAZINE  100 MG tablet Commonly known as: APRESOLINE    isosorbide  mononitrate 30 MG ER tablet Commonly known as: IMDUR    MIRCERA INJ   omega-3 fatty acids 500 mg Cap capsule   TYLENOL  ARTHRITIS PAIN 650 MG ER tablet Generic drug: acetaminophen          Where to Get Your Medications     These medications were sent to [facility]  [id number], [place] [address]    Hours: 8:30-5 MON-FRI Phone: [phone]  lidocaine  4 % patch ondansetron  4 MG disintegrating tablet oxyCODONE  5 MG immediate release tablet polyethylene glycol powder     Major Imaging: please  see below for details  Imaging:  CT RCC protocol incl chest w MIPS and dual abd pel w wo Result Date: 06/10/2024 Procedure: CT Chest with IV Contrast Procedure: CT Abdomen without and with IV Contrast Procedure: CT Pelvis with IV Contrast Comparison:  CT abdomen and pelvis from May 04, 2024. CT chest from May 05, 2024. Indication:  N18.6 End stage renal disease (CMS/HHS-HCC), N28.89 Other specified disorders of kidney and ureter. Technique:  CT imaging of the chest, abdomen, and pelvis was performed following the administration of IV contrast. Imaging was performed through the kidneys before the administration of contrast, followed by arterial phase imaging through the liver, and portal venous phase imaging of the chest, abdomen, and pelvis.   Iodinated contrast was used due to the indications for the examination, to improve disease detection and to further define anatomy.   Coronal and sagittal reformatted images of the abdomen and pelvis were generated and reviewed. 3-D maximal intensity projection (MIP) reconstructions of the chest were created and reviewed to potentially increase study sensitivity. Findings:  Chest:  - Support Devices: There is a right IJ approach central venous catheter with tip residing in the inferior aspect superior vena cava at the cavoatrial junction - Chest wall and Thoracic Inlet: There there are two left thyroid lobe nodules measuring 1 cm and 1.1 cm. Bursal fluid collections within the right shoulder. There is a prominent 1.2 cm right axillary lymph node with a fatty hilum (series 5, image 20). - Mediastinum and Hila: No masses or lymphadenopathy. - Thoracic Vessels: Normal caliber of the thoracic aorta and main pulmonary artery. Mild atherosclerotic plaque. - Heart and Pericardium: Normal heart size.  No pericardial effusion. Severe calcified atherosclerotic plaque in the coronary arteries. - Lungs and Airways: There is a new 7 mm perifissural triangular nodule (series 7, image 218) at the junction of the minor and major fissure. There is a small 2 mm solid lung nodule in the left upper lobe (series 7, image 187)  No suspicious nodules or opacities. - Pleura: No pleural effusions. No pneumothorax Abdomen and pelvis: - Liver: Normal in morphology and enhancement.  No suspicious hepatic masses are identified.  The portal and hepatic veins are patent. - Biliary and Gallbladder: Mild intrahepatic biliary ductal dilatation. Common bile duct measures 1.0 cm (series 5, image 121). This is favored due to large right renal mass causing mass effect. The gallbladder is unremarkable. - Spleen: Normal in appearance.  - Pancreas: Normal in appearance. No intrapancreatic ductal dilatation. - Adrenal Glands: Normal in appearance. - Kidneys: There is a 19 x 16 x 17 cm heterogeneously enhancing well-circumscribed right renal mass with central necrosis with no definite no evidence of renal vein invasion although the evaluation of the right renal vein is limited given that the IVC is markedly compressed by the right renal mass. On the left kidney there are scattered renal cysts and subcentimeter hypodensities too  small to characterize. In the left kidney, there are enhancing lesions at the superior pole (series 5, image 128) and inferior pole (series 5, image 165) that are consistent with renal cell carcinoma. No hydronephrosis. - Abdominal and Pelvic Vasculature: No abdominal aortic aneurysm. Bilateral renal arteries are patent without significant atherosclerotic involvement. There is mild to moderate atherosclerotic disease of the abdominal aorta and iliac arteries. The renal veins appear compressed and partially obscured but show no definite evidence of invasion. The retrohepatic IVC is incompletely opacified due to mixing artifact, without definite renal vein or caval invasion. -  Gastrointestinal Tract: No abnormal dilation or wall thickening. - Peritoneum/Mesentery/Retroperitoneum: No free fluid.  No free intraperitoneal air.  - Lymph Nodes: No retroperitoneal or mesenteric lymphadenopathy.  - Bladder: There is a Foley catheter within the bladder with some air within the bladder lumen. The bladder has a thick wall favored to be secondary to being decompressed by Foley catheter. - Pelvic Organs: Unremarkable. - Body Wall: Unremarkable. - Musculoskeletal:  No aggressive appearing osseous lesions. Visualized left hip replacement. Level degenerative changes of the spine. Impression: 1.  Large (19 X  16 X  17 cm) heterogeneously enhancing well-circumscribed renal cell carcinoma with central necrosis, causing mass effect on the IVC without definite renal vein invasion although the right renal vein is hard to visualize given marked compression of the IVC. 2.  In the left kidney, there are enhancing lesions at the superior pole and inferior pole that are concerning for small renal cell carcinomas. 3.  Mild intrahepatic biliary ductal dilatation with common bile duct dilation favored secondary to mass effect by large right renal mass. 4.  There is a prominent 1.2 cm right axillary lymph node with a fatty hilum that is  nonspecific and would be atypical for a metastatic site for renal cell carcinoma, recommend attention on follow-up. 5.  No definite CT evidence of metastatic disease to the chest, abdomen, or pelvis. The preliminary report (critical or emergent communication) was reviewed prior to this dictation and there are no critical differences between the preliminary results and the impressions in this final report. Electronically Reviewed by:  Marvin Meissner, MD, [facility] Electronically Reviewed on:  06/10/2024 12:17 PM I have reviewed the images and concur with the above findings. Electronically Signed by:  Olam Rouse, MD, [facility] Electronically Signed on:  06/10/2024 2:11 PM  Request for image library services Result Date: 06/03/2024 Please refer to the appropriate PACS to view images.   Request for image library services Result Date: 06/02/2024 Please refer to the appropriate PACS to view images.   Request for image library services Result Date: 05/28/2024 Please refer to the appropriate PACS to view images.    Discharge Disposition:  Home  Allergies: Allergies  Allergen Reactions  . Hydrocodone Other (See Comments)    Severe hiccups  . Oxycodone  Hcl Other (See Comments)    Hiccups     Patient Instructions: Hard copy was provided to the patient prior to discharge  Code Status:  Full Code  Activity:  activity as tolerated and no heavy lifting for 4-6 weeks  Diet: Regular  Pertinent Recent Labs:  Lab Results  Component Value Date   WBC 10.4 (H) 06/12/2024   HGB 8.5 (L) 06/12/2024   HCT 26.2 (L) 06/12/2024   PLT 258 06/12/2024   Lab Results  Component Value Date   NA 130 (L) 06/13/2024   K 4.2 06/13/2024   CL 94 (L) 06/13/2024   CO2 23 06/13/2024   BUN 49 (H) 06/13/2024   CREATININE 8.2 (H) 06/13/2024   GLUCOSE 86 06/13/2024    Results Pending at Discharge:  None  Future Appointments  Date Time Provider Department Center  06/28/2024 10:45 AM Sagester,  Lamarr Cook, NP [facility]  Trial of void appointment requested and pending.  Adina Colander, MD [facility], PGY-1 Urology Pager: [phone]

## 2024-06-13 NOTE — Care Plan (Signed)
  Problem: Risk for harm related to dialysis access Goal: Ability to maintain safe environment during dialysis procedure Outcome: Progressing   Problem: Alteration in Hemodynamic Stability Goal: Ability to maintain vital signs within normal range will improve Outcome: Progressing Goal: Ability to maintain absence of bleeding will improve Outcome: Progressing   Problem: Fluid Volume Imbalance Goal: Impaired intake and output balance will improve Outcome: Progressing   Problem: Risk for infection Goal: Patient will remain free from infection Outcome: Progressing   Problem: Electrolyte Imbalance Goal: Electrolyte Imbalance will improve by discharge Outcome: Progressing

## 2024-06-13 NOTE — Care Plan (Signed)
 Hemodialysis Handoff Note  The patient received a hemodialysis treatment that lasted 3:55hours:minutes Patient's net fluid removal was 2.7 L.  Did the patient tolerate the treatment? Yes Was an intervention required? None Post Dialysis Vitals:  Sitting:  125/81BP; 60 HR   Post-Hemodialysis Comments: The patient tolerated the HD treatment. UFG met. The circuit clotted twice unable to rinseback. Marko, NP and Primary Nurse Norman, RN were made aware. Blood Products Given:no Outstanding Dialysis Medications: none AVF/AVG Dressing Removal: n/a  Report given to: Norman, RN

## 2024-06-13 NOTE — Progress Notes (Signed)
 Physical Therapy Progress Note  Patient Name:  Marvin Garcia. Date of Therapy Session: 06/13/24 Time of Therapy Session:  0826 Duration of Session:  14 Minutes Room/Bed: 9115/9115-01  Precautions: Falls Risk, Abdominal precautions, Low hematocrit   Assessment: Pt is clear for discharge home from a PT perspective. Main limitations include post-op abdominal pain and abdominal precautions. In summary, pt safely ambulated 300 ft without an assistive device, and practiced flat bed mobility w/o bed rails. Education provided on abdominal precautions and post-op activity progression. He has sufficient support available from his wife and son upon d/c. Recommending no DME or follow up PT services. Acute PT to sign off, please reconsult if any changes in status.   Recommendations for mobility with nursing:  Use BMAT score and associated clinical judgement to determine safe mobility on a daily basis as patient status may be subject to change.   Supervision to ambulate as tolerated; encourage 3-4 walks/day and sitting OOB for meals  Discharge Recommendations: Is the patient safe to discharge to the recommended disposition? Yes Discharge Recommendations: Home DME Recommendations    Flowsheet Row Most Recent Value  DME Recommendations None Filed at 06/13/2024 [id number]   Complete details of today's session: Chart review completed, and RN consulted prior to therapy. Pt found supine in bed, and agreeable to therapy. No visitors present. Pt reported no pain. See flowsheet below for completed mobility details. In summary, educated pt on log roll technique & abdominal precautions. Pt transferred to/from EOB w/ HOB lowered & w/o bed rails, and ambulated on the unit w/o a rest break. At end of session, the patient was left semi-reclined in bed, with all needs in reach, with nurse call device in reach (w/ transport going to HD). His status was communicated to the NP, Case Manager, RN, Patient.     06/13/24  0826  Discipline Timestamp  Discipline Timestamp PT  Documentation Type     Documentation Type                                E,R, T   Treatment  Patient Subjective Information  Patient Subjective Information Anticipates discharge from hospital today;Patient agreeable to therapy  Precautions  Precautions Falls Risk;Abdominal precautions;Low hematocrit  Patient/Family Goals  Patient/Family Goals Go home  Pain Assessment  Pain Assessment %% 0-10  Pain Score %% Zero  Review of Systems Cardiovascular/Pulmonary Function  Any supplemental oxygen? No  Review of Systems - Cognition                         Overall Cognitive Status WNL  Mobility  Bed Mobility  Log rolling;Sidelying to Sit;Sit to Sidelying;Sit to Stand;Stand to Sit       Log Rolling Assistance Supervision       Log Rolling Details Requires extra time;Verbal cues;Right       Number of People Required 1       Sidelying to Sit Assistance Supervision       Sidelying to Sit Details Requires extra time;Verbal cues;Right;Head of bed elevated (HOB only slightly elevated)       Number of People Required 1       Sit to Sidelying  Assistance Supervision       Sit to Sidelying details Requires extra time;Verbal cues;Head of bed elevated (HOB only slightly elevated)       Number of People Required 1  Sit to Stand Assistance Supervision       Sit to Stand Details Requires extra time       Number of People Required 1       Stand to Sit Assistance Supervision       Stand to Sit Details Requires extra time       Number of People Required 1  Ambulation Yes       Ambulation Assistance Supervision       Number of People Required 1  Distance ambulated in feet 300 feet  Ambulation Assistive Device None   Number of rest breaks 0  Gait Pattern Shuffle;Decreased push off (Decreased cadence but pt reports being at/near baseline w/ ambulation. Overall steady w/o LOB.)  Gait Pattern Swing Phase Decreased step length, bilateral   Stairs/Curb assessed No  Adult PT Outcomes  Highest Level of Function Yes  Highest Level of Activity 11- Walking wihtout gait aid and no assist  Inpatient AM-PAC Performed Basic Mobility Inpatient Short Form - 6 Clicks  AM-PAC 6 Clicks Basic Mobility Inpatient Short Form  Turning from your back to your side while in a flat bed without using bedrails? 4-None  Moving from lying on your back to sitting on the side of a flat bed without using bedrails? 4-None  Moving to and from a bed to a chair (including a wheelchair)? 4-None  Standing up from a chair using using your arms (e.g,. wheelchair, or bedside chair)? 4-None  To walk in hospital room? 4-None  Climbing 3-5 steps with a railing? 3-A Little  AM-PAC Basic Mobility Raw Score 23  AM-PAC Basic Mobility t-Scale Score 50.88  AM-PAC Basic Mobility G-Code Modifier CI  Patient Status At End of Session  Status Communicated to: NP;Case Manager;RN;Patient  Pt Left: semi-reclined in bed;with all needs in reach;with nurse call device in reach (w/ transport going to HD)  Assessment  PT Enhancers Good family support/resources;Motivated;Accessible home;Intact cognition;Previous level of function/active lifestyle;Has needed equipment;Good insight into disability;Good problem solving skills;Good Upper Extremity Strength  PT Barriers Decreased activity tolerance/medically complex/comorbidities;Pain  Impairments/Functional Limitations    Decreased balance;Impaired functional mobility;Decreased strength;Decreased endurance/activity tolerance;Decreased ROM/flexibility;Gait/Ambulation limitations;Pain;Decreased transfers;Decreased bed mobility  Rehab Potential   Good  Patient safe for DC/PT perspective? Yes  Summary of Findings  Safe to return home;Notified case manager that patient is ready for discharge from PT perspective;Notified provider that patient is ready for discharge from PT perspective  Plan  Treatment/Interventions None indicated  PT  Frequency Patient discharged from PT  Discharge Recommendation ([facility]) Home  DME Recommendations None  Plan(Progress Note) The patient is anticipating discharge today;Discontinue physical therapy    Please see patient education record for PT teaching completed today.   KATHERINE DREW, PT , DPT, [employer] Certified Specialist Pager: [phone] PT/OT office phone: [phone]

## 2024-06-16 ENCOUNTER — Ambulatory Visit (HOSPITAL_COMMUNITY)

## 2024-06-16 ENCOUNTER — Ambulatory Visit (HOSPITAL_COMMUNITY): Admitting: Vascular Surgery

## 2024-06-25 ENCOUNTER — Other Ambulatory Visit: Payer: Self-pay

## 2024-06-25 ENCOUNTER — Emergency Department (HOSPITAL_COMMUNITY)
Admission: EM | Admit: 2024-06-25 | Discharge: 2024-06-25 | Disposition: A | Discharge status: Home / Self Care | Attending: Emergency Medicine | Admitting: Emergency Medicine

## 2024-06-25 ENCOUNTER — Encounter (HOSPITAL_COMMUNITY): Payer: Self-pay

## 2024-06-25 DIAGNOSIS — Z466 Encounter for fitting and adjustment of urinary device: Secondary | ICD-10-CM | POA: Diagnosis not present

## 2024-06-25 DIAGNOSIS — R339 Retention of urine, unspecified: Secondary | ICD-10-CM | POA: Diagnosis present

## 2024-06-25 NOTE — ED Notes (Signed)
 Pt given another cup of ice water .

## 2024-06-25 NOTE — ED Provider Notes (Signed)
 [place] EMERGENCY DEPARTMENT AT [facility] Provider Note   CSN: [id number] Arrival date & time: 06/25/24  9268     Patient presents with: Foley Catheter Leaking   Marvin Garcia. is a 68 y.o. male.   68 year old male with a history of right renal mass status post nephrectomy on 06/10/2024 who presents to the emergency department with leakage around his Foley catheter.  Over the past few days has noticed leakage around his Foley catheter.  No flank pain or fevers.  No suprapubic pain.  No blood in his urine.  Says that he was due for a voiding trial and Foley removal on 11/18 and would like to have his Foley removed today to see if he can still pee.  He has been on dialysis for the past month.  Says he still makes urine about 3-4 times per day.  Has been taking Flomax as well.       Prior to Admission medications   Medication Sig Start Date End Date Taking? Authorizing Provider  acetaminophen  (TYLENOL ) 325 MG tablet Take 2 tablets (650 mg total) by mouth every 6 (six) hours as needed for mild pain (pain score 1-3) or fever (or Fever >/= 101). 05/11/24   Elgergawy, Brayton RAMAN, MD  carvedilol  (COREG ) 25 MG tablet Take 25 mg by mouth 2 (two) times daily with a meal.    [provider]  Coenzyme Q10 (COQ-10) 100 MG CAPS Take 100 mg by mouth daily.    [provider]  diltiazem (CARDIZEM CD) 360 MG 24 hr capsule Take 1 capsule (360 mg total) by mouth daily. 05/12/24   Elgergawy, Brayton RAMAN, MD  Febuxostat  80 MG TABS Take 80 mg by mouth daily.     [provider]  hydrALAZINE  (APRESOLINE ) 100 MG tablet Take 100 mg by mouth 3 (three) times daily.    [provider]  isosorbide  mononitrate (IMDUR ) 30 MG 24 hr tablet Take 30 mg by mouth daily. 09/08/19   [provider]  Multiple Vitamin (MULTIVITAMIN WITH MINERALS) TABS tablet Take 1 tablet by mouth daily. *alive mens 50+*    [provider]  Omega-3 Fatty Acids (OMEGA 3 500)  500 MG CAPS Take 500 mg by mouth daily.    [provider]  OVER THE COUNTER MEDICATION Take 1 tablet by mouth daily. **vitra-cq10-100 mg*    [provider]  OVER THE COUNTER MEDICATION Take 1 tablet by mouth daily. **ultimate iron 50 mg**    [provider]    Allergies: Hydrocodone    Review of Systems  Updated Vital Signs BP 136/82   Pulse 74   Temp 99 F (37.2 C) (Oral)   Resp 14   Ht 5' 7 (1.702 m)   Wt 73.9 kg   SpO2 98%   BMI 25.53 kg/m   Physical Exam Vitals and nursing note reviewed.  Constitutional:      General: He is not in acute distress.    Appearance: He is well-developed.  HENT:     Head: Normocephalic and atraumatic.     Right Ear: External ear normal.     Left Ear: External ear normal.     Nose: Nose normal.  Eyes:     Extraocular Movements: Extraocular movements intact.     Conjunctiva/sclera: Conjunctivae normal.     Pupils: Pupils are equal, round, and reactive to light.  Abdominal:     General: There is no distension.     Palpations: Abdomen is soft.  There is no mass.     Tenderness: There is no abdominal tenderness. There is no guarding.  Genitourinary:    Comments: Chaperoned by RN Luster  Foley catheter in place.  Small amount of leakage around the Foley catheter.  Straw-colored urine without any hematuria in Foley bag. Musculoskeletal:     Cervical back: Normal range of motion and neck supple.  Neurological:     Mental Status: He is alert.     (all labs ordered are listed, but only abnormal results are displayed) Labs Reviewed - No data to display  EKG: None  Radiology: No results found.   Procedures   Medications Ordered in the ED - No data to display                                  Medical Decision Making  68 year old male with a history of right renal mass status post nephrectomy on 06/10/2024 who presents to the emergency department with leakage around his Foley catheter.   Initial Ddx:   Urinary retention, clogged Foley catheter, hematuria, UTI  MDM/Course:  Patient presents to the emergency department with request to have his Foley catheter removed.  Does report some leakage around his Foley catheter.  Says he been having the normal amount of urine output.  No discomfort near his Foley catheter site or suprapubic tenderness to palpation.  No fevers or chills or flank pain.  Low concern for catheter associated UTI at this point in time so do not feel that he needs UA to be sent.  He had his Foley catheter removed and was able to void spontaneously afterwards.  Had a bladder scan that showed he only had 9 mL of urine in his bladder.  Upon re-evaluation was feeling back to his baseline.  Will have him follow-up with his urologist and continue his Flomax until they tell him to discontinue  This patient presents to the ED for concern of complaints listed in HPI, this involves an extensive number of treatment options, and is a complaint that carries with it a high risk of complications and morbidity. Disposition including potential need for admission considered.   Dispo: DC Home. Return precautions discussed including, but not limited to, those listed in the AVS. Allowed pt time to ask questions which were answered fully prior to dc.  Records reviewed Outpatient Clinic Notes I have reviewed the patients home medications and made adjustments as needed Social Determinants of health:  Geriatric  Portions of this note were generated with Scientist, clinical (histocompatibility and immunogenetics). Dictation errors may occur despite best attempts at proofreading.     Final diagnoses:  Urinary retention  Encounter for Foley catheter removal    ED Discharge Orders     None          Yolande Lamar BROCKS, MD 06/25/24 1043

## 2024-06-25 NOTE — ED Triage Notes (Signed)
 Pt came in via POV d/t his indwelling foley catheter leaking at the insertion site since yesterday. States he had a kidney removed on September 3rd & has had the foley since then d/t retaining. Denies abd pain, endorses flow of urine is normal and the leakage is the only problem.

## 2024-06-25 NOTE — Discharge Instructions (Signed)
 You were seen for Foley catheter removal in the emergency department.  You were able to pee without difficulty  At home, please continue your Flomax.    Check your MyChart online for the results of any tests that had not resulted by the time you left the emergency department.   Follow-up with your primary doctor in 2-3 days regarding your visit.  Follow-up with your urologist as scheduled.  Return immediately to the emergency department if you experience any of the following: Urinary retention, bladder pain, fever, or any other concerning symptoms.    Thank you for visiting our Emergency Department. It was a pleasure taking care of you today.

## 2024-06-25 NOTE — ED Notes (Signed)
 Pt provided ice water  x2 cups and call light.  Pt directed to let staff know when he needs to urinate.

## 2024-07-05 ENCOUNTER — Other Ambulatory Visit (HOSPITAL_COMMUNITY): Payer: Self-pay

## 2024-07-12 ENCOUNTER — Ambulatory Visit: Admitting: Cardiology

## 2024-07-21 ENCOUNTER — Ambulatory Visit: Admitting: Vascular Surgery

## 2024-07-21 ENCOUNTER — Encounter: Payer: Self-pay | Admitting: Vascular Surgery

## 2024-07-21 ENCOUNTER — Ambulatory Visit (HOSPITAL_COMMUNITY): Admission: RE | Admit: 2024-07-21 | Discharge: 2024-07-21 | Attending: Vascular Surgery

## 2024-07-21 VITALS — BP 144/86 | HR 60 | Temp 98.1°F | Resp 20 | Ht 67.0 in | Wt 163.8 lb

## 2024-07-21 DIAGNOSIS — N186 End stage renal disease: Secondary | ICD-10-CM | POA: Insufficient documentation

## 2024-07-21 DIAGNOSIS — Z992 Dependence on renal dialysis: Secondary | ICD-10-CM | POA: Diagnosis present

## 2024-07-21 DIAGNOSIS — N179 Acute kidney failure, unspecified: Secondary | ICD-10-CM

## 2024-07-21 NOTE — Progress Notes (Signed)
 Office Note     CC:  ESRD Requesting Provider:  [name], MD  HPI: Marvin Garcia. is a Right handed 68 y.o. ([birth year]) male with kidney disease who presents at the request of [name], MD for permanent HD access. The patient has had no prior access procedures. Per pt, previous tunneled lines have been placed in right IJ. Current access is Right IJ. Dialysis days are MWF.   On exam, Gabreal was doing well.  A native of [place], he has lived in [place] for a number of years.  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  He is happily married for the last 45 years with 2 children, and 2 grandchildren.  Jeury was aware that he needed long-term HD access.  Past Medical History:  Diagnosis Date   Arrhythmia 1991   patient  describes that he woke up surrounded by doctors saying that his heart had stopped while sleeping; denies having pain during this event; also denies any reccurrent issues after that episode; says that was when they told me i had high blood pressure    Arthritis    Chronic kidney disease    stage IV managed by Dr Arthurine leash kidney    Headache    Hypertension    Umbilical hernia     Past Surgical History:  Procedure Laterality Date   HERNIA REPAIR     inguinal    INSERTION OF MESH N/A 01/21/2017   Procedure: INSERTION OF MESH;  Surgeon: Signe Mitzie LABOR, MD;  Location: [facility];  Service: General;  Laterality: N/A;   IR TUNNELED CENTRAL VENOUS CATH PLC W IMG  05/07/2024   JOINT REPLACEMENT     KNEE ARTHROSCOPY     TOTAL HIP ARTHROPLASTY Left 03/19/2017   Procedure: LEFT TOTAL HIP ARTHROPLASTY ANTERIOR APPROACH;  Surgeon: Fidel Rogue, MD;  Location: [facility];  Service: Orthopedics;  Laterality: Left;  Needs RNFA   TOTAL KNEE ARTHROPLASTY  06/04/2012   Procedure: TOTAL KNEE ARTHROPLASTY;  Surgeon: Lamar Collet, MD;  Location: [facility];  Service: Orthopedics;  Laterality: Left;   TOTAL KNEE ARTHROPLASTY Right  10/14/2019   Procedure: TOTAL KNEE ARTHROPLASTY;  Surgeon: Collet Lamar, MD;  Location: [facility];  Service: Orthopedics;  Laterality: Right;  adductor canal   UMBILICAL HERNIA REPAIR N/A 01/21/2017   Procedure: UMBILICAL HERNIA REPAIR WITH MESH;  Surgeon: Signe Mitzie LABOR, MD;  Location: [facility];  Service: General;  Laterality: N/A;    Social History   Socioeconomic History   Marital status: Married    Spouse name: Not on file   Number of children: Not on file   Years of education: Not on file   Highest education level: Not on file  Occupational History   Not on file  Tobacco Use   Smoking status: Never   Smokeless tobacco: Never  Vaping Use   Vaping status: Never Used  Substance and Sexual Activity   Alcohol use: No   Drug use: No   Sexual activity: Not on file  Other Topics Concern   Not on file  Social History Narrative   Dialysis M-W-F   Right Handed   Social Drivers of Health   Tobacco Use: Low Risk (07/21/2024)   Patient History    Smoking Tobacco Use: Never    Smokeless Tobacco Use: Never    Passive Exposure: Not on file  Financial Resource Strain: Low Risk  (06/16/2024)   Received from [facility]  Overall Financial Resource Strain (CARDIA)    Difficulty of Paying Living Expenses: Not hard at all  Food Insecurity: No Food Insecurity (06/16/2024)   Received from [facility]   Epic    Within the past 12 months, you worried that your food would run out before you got the money to buy more.: Never true    Within the past 12 months, the food you bought just didn't last and you didn't have money to get more.: Never true  Transportation Needs: No Transportation Needs (06/16/2024)   Received from [facility] - Transportation    In the past 12 months, has lack of transportation kept you from medical appointments or from getting medications?: No    Lack of Transportation (Non-Medical): No  Physical Activity:  Not on file  Stress: Not on file  Social Connections: Moderately Isolated (05/05/2024)   Social Connection and Isolation Panel    Frequency of Communication with Friends and Family: Twice a week    Frequency of Social Gatherings with Friends and Family: Never    Attends Religious Services: More than 4 times per year    Active Member of [employer] or Organizations: No    Attends Banker Meetings: Never    Marital Status: Married  Catering Manager Violence: Not At Risk (05/05/2024)   Epic    Fear of Current or Ex-Partner: No    Emotionally Abused: No    Physically Abused: No    Sexually Abused: No  Depression (PHQ2-9): Not on file  Alcohol Screen: Not on file  Housing: Low Risk  (06/09/2024)   Received from [facility]   Epic    In the last 12 months, was there a time when you were not able to pay the mortgage or rent on time?: No    In the past 12 months, how many times have you moved where you were living?: 0    At any time in the past 12 months, were you homeless or living in a shelter (including now)?: No  Utilities: Not At Risk (06/09/2024)   Received from [facility]   Epic    In the past 12 months has the electric, gas, oil, or water  company threatened to shut off services in your home?: No  Health Literacy: Not on file   Family History  Problem Relation Age of Onset   Diabetes Mother    Heart disease Father    Asthma Son    Diabetes Maternal Grandmother     Current Outpatient Medications  Medication Sig Dispense Refill   acetaminophen  (TYLENOL ) 325 MG tablet Take 2 tablets (650 mg total) by mouth every 6 (six) hours as needed for mild pain (pain score 1-3) or fever (or Fever >/= 101).     carvedilol  (COREG ) 25 MG tablet Take 25 mg by mouth 2 (two) times daily with a meal.     Coenzyme Q10 (COQ-10) 100 MG CAPS Take 100 mg by mouth daily.     diltiazem  (CARDIZEM  CD) 360 MG 24 hr capsule Take 1 capsule (360 mg total) by mouth  daily. 30 capsule 0   Febuxostat  80 MG TABS Take 80 mg by mouth daily.      hydrALAZINE  (APRESOLINE ) 100 MG tablet Take 100 mg by mouth 3 (three) times daily.     isosorbide  mononitrate (IMDUR ) 30 MG 24 hr tablet Take 30 mg by mouth daily.     Multiple Vitamin (MULTIVITAMIN WITH  MINERALS) TABS tablet Take 1 tablet by mouth daily. *alive mens 50+*     Omega-3 Fatty Acids (OMEGA 3 500) 500 MG CAPS Take 500 mg by mouth daily.     OVER THE COUNTER MEDICATION Take 1 tablet by mouth daily. **vitra-cq10-100 mg*     OVER THE COUNTER MEDICATION Take 1 tablet by mouth daily. **ultimate iron 50 mg**     No current facility-administered medications for this visit.    Allergies[1]   REVIEW OF SYSTEMS:  [X]  denotes positive finding, [ ]  denotes negative finding Cardiac  Comments:  Chest pain or chest pressure:    Shortness of breath upon exertion:    Short of breath when lying flat:    Irregular heart rhythm:        Vascular    Pain in calf, thigh, or hip brought on by ambulation:    Pain in feet at night that wakes you up from your sleep:     Blood clot in your veins:    Leg swelling:         Pulmonary    Oxygen at home:    Productive cough:     Wheezing:         Neurologic    Sudden weakness in arms or legs:     Sudden numbness in arms or legs:     Sudden onset of difficulty speaking or slurred speech:    Temporary loss of vision in one eye:     Problems with dizziness:         Gastrointestinal    Blood in stool:     Vomited blood:         Genitourinary    Burning when urinating:     Blood in urine:        Psychiatric    Major depression:         Hematologic    Bleeding problems:    Problems with blood clotting too easily:        Skin    Rashes or ulcers:        Constitutional    Fever or chills:      PHYSICAL EXAMINATION:  Vitals:   07/21/24 1349  BP: (!) 144/86  Pulse: 60  Resp: 20  Temp: 98.1 F (36.7 C)  TempSrc: Temporal  SpO2: 98%  Weight: 163 lb  12.8 oz (74.3 kg)  Height: 5' 7 (1.702 m)    General:  WDWN in NAD; vital signs documented above Gait: Not observed HENT: WNL, normocephalic Pulmonary: normal non-labored breathing , without Rales, rhonchi,  wheezing Cardiac: regular HR, Abdomen: soft, NT, no masses Skin: without rashes Vascular Exam/Pulses:  Right Left  Radial 2+ (normal) 2+ (normal)  Ulnar 2+ (normal) 2+ (normal)  Femoral    Popliteal    DP    PT     Extremities: without ischemic changes, without Gangrene , without cellulitis; without open wounds;  Musculoskeletal: no muscle wasting or atrophy  Neurologic: A&O X 3;  No focal weakness or paresthesias are detected Psychiatric:  The pt has Normal affect.   Non-Invasive Vascular Imaging:   See studies    ASSESSMENT/PLAN:  Purnell Daigle. is a 68 y.o. male who presents with end stage renal disease  Based on vein mapping and examination, Holoman is a candidate for fistula creation in either extremity.  Being that he is right-handed, we discussed left arm fistula creation.  I think he would be best served with left arm brachiocephalic fistula  creation. I had an extensive discussion with this patient in regards to the nature of access surgery, including risk, benefits, and alternatives.   The patient is aware that the risks of access surgery include but are not limited to: bleeding, infection, steal syndrome, nerve damage, ischemic monomelic neuropathy, failure of access to mature, complications related to venous hypertension, and possible need for additional access procedures in the future.  I discussed with the patient the nature of the staged access procedure, specifically the need for a second operation to transpose the first stage fistula if it matures adequately.   The patient has  agreed to proceed with the above procedure which will be scheduled at his convenience.  Fonda FORBES Rim, MD [facility] [phone]     [1]   Allergies Allergen Reactions   Hydrocodone Other (See Comments)    Severe hiccups

## 2024-07-22 ENCOUNTER — Other Ambulatory Visit: Payer: Self-pay

## 2024-07-22 ENCOUNTER — Telehealth: Payer: Self-pay

## 2024-07-22 DIAGNOSIS — N186 End stage renal disease: Secondary | ICD-10-CM

## 2024-07-22 NOTE — Telephone Encounter (Signed)
 Attempted to call for surgery scheduling. LVM

## 2024-08-09 ENCOUNTER — Encounter (HOSPITAL_COMMUNITY): Payer: Self-pay | Admitting: Vascular Surgery

## 2024-08-09 ENCOUNTER — Other Ambulatory Visit: Payer: Self-pay

## 2024-08-09 NOTE — Anesthesia Preprocedure Evaluation (Addendum)
 "                                  Anesthesia Evaluation  Patient identified by MRN, date of birth, ID band Patient awake    Reviewed: Allergy & Precautions, NPO status , Patient's Chart, lab work & pertinent test results  Airway Mallampati: II  TM Distance: >3 FB     Dental no notable dental hx. (+) Teeth Intact, Caps, Dental Advisory Given   Pulmonary neg pulmonary ROS   Pulmonary exam normal breath sounds clear to auscultation       Cardiovascular hypertension, Pt. on medications Normal cardiovascular exam+ dysrhythmias Atrial Fibrillation + Valvular Problems/Murmurs AS  Rhythm:Regular Rate:Normal  EKG 05/10/24 Atrial fibrillation with RVR 110/min, incomplete RBBB,  possible RVH Possible Anterior infarct ST-T wave changes consider inferior ischemia  Echo 05/08/24  1. Left ventricular ejection fraction, by estimation, is 60 to 65%. The  left ventricle has normal function. The left ventricle has no regional  wall motion abnormalities. There is moderate concentric left ventricular  hypertrophy. Left ventricular diastolic parameters were normal.   2. Right ventricular systolic function is normal. The right ventricular  size is normal. Tricuspid regurgitation signal is inadequate for assessing  PA pressure.   3. The mitral valve is degenerative. No evidence of mitral valve  regurgitation. No evidence of mitral stenosis. Moderate mitral annular  calcification.   4. The aortic valve is calcified. There is moderate calcification of the  aortic valve. There is moderate thickening of the aortic valve. Aortic  valve regurgitation is not visualized. Mild aortic valve stenosis. Aortic  valve area, by VTI measures 1.55 cm. Aortic valve mean gradient measures 7.5 mmHg. Aortic valve Vmax measures 1.99 m/s.   5. The inferior vena cava is normal in size with greater than 50%  respiratory variability, suggesting right atrial pressure of 3 mmHg.   6. Aortic dilatation noted.  There is mild dilatation of the aortic root,  measuring 39 mm.     Neuro/Psych  Headaches  negative psych ROS   GI/Hepatic negative GI ROS, Neg liver ROS,,,  Endo/Other  negative endocrine ROS    Renal/GU ESRF and DialysisRenal diseaseLast dialysis yesterday Lab Results      Component                Value               Date                      NA                       140                 06/05/2024                CL                       103                 06/05/2024                K                        4.0  06/05/2024                CO2                      20 (L)              06/05/2024                BUN                      51 (H)              06/05/2024                CREATININE               7.47 (H)            06/05/2024                GFRNONAA                 7 (L)               06/05/2024                CALCIUM                   10.0                06/05/2024                PHOS                     3.9                 05/11/2024                ALBUMIN                  3.5                 05/11/2024                GLUCOSE                  105 (H)             06/05/2024             negative genitourinary   Musculoskeletal  (+) Arthritis , Osteoarthritis,    Abdominal   Peds  Hematology  (+) Blood dyscrasia, anemia Lab Results      Component                Value               Date                      WBC                      10.6 (H)            06/05/2024                HGB                      8.0 (L)             06/05/2024  HCT                      24.9 (L)            06/05/2024                MCV                      77.8 (L)            06/05/2024                PLT                      317                 06/05/2024              Anesthesia Other Findings   Reproductive/Obstetrics                              Anesthesia Physical Anesthesia Plan  ASA: 4  Anesthesia Plan: MAC and Regional    Post-op Pain Management: Regional block* and Minimal or no pain anticipated   Induction: Intravenous  PONV Risk Score and Plan: 2 and Treatment may vary due to age or medical condition and Propofol  infusion  Airway Management Planned: Natural Airway and Simple Face Mask  Additional Equipment: None  Intra-op Plan:   Post-operative Plan:   Informed Consent: I have reviewed the patients History and Physical, chart, labs and discussed the procedure including the risks, benefits and alternatives for the proposed anesthesia with the patient or authorized representative who has indicated his/her understanding and acceptance.     Dental advisory given  Plan Discussed with: CRNA and Anesthesiologist  Anesthesia Plan Comments: (PAT note by Lynwood Hope, PA-C:  68 year old male with pertinent history including HTN, renal carcinoma, CKD with recent progression to ESRD.  Patient was admitted 9/24 through 05/11/2024 after presenting with abdominal discomfort and fullness.  Workup showed large right-sided renal mass suspicious for RCC.  During admission he progressed from CKD 5 to ESRD and was initiated on HD via catheter.  He was also noted to have new onset of paroxysmal A-fib with RVR on 05/07/2024.  He was seen by cardiology and started on oral Cardizem  and Coreg  and converted to sinus rhythm.  He had another episode of A-fib RVR 05/10/24 requiring Cardizem  drip.  Echo showed LVEF 60 to 65%, moderate concentric LVH, normal RV.  Report states mild aortic stenosis with MG 7.5 mmHg and valve area 1.55 cm.  Anticoagulation was deferred in the setting of new diagnosis of RCC and planned nephrectomy.  He is currently on carvedilol  25 mg twice daily and Cardizem  CD3 360 mg daily.  Patient subsequently underwent right radical nephrectomy at [facility] on 06/10/2024.  He was seen in outpatient follow-up by urology on 06/28/2024 and noted to be doing well.  Per note,  He is s/p right nephrectomy 06/10/24 after an  extremely large tumor was noted (18 cm) during work-up for weight loss and abdominal discomfort. He looks very well today and has been doing well in the initial post-op period.SABRASABRAOf note, he remains on HD 3x/week but still voids normally. He had post-op retention and passed his TOV a few days ago at a local ER. His PVR today was unremarkable and I encouraged him to pick up and start taking  Flomax prescribed by his nephrologist.  Currently dialyzing MWF via right IJ.  Per anesthesia intubation note 06/10/2024, video laryngoscope was used.  Patient will need day of surgery labs and evaluation.  TTE 04/30/2024: 1. Left ventricular ejection fraction, by estimation, is 60 to 65%. The  left ventricle has normal function. The left ventricle has no regional  wall motion abnormalities. There is moderate concentric left ventricular  hypertrophy. Left ventricular  diastolic parameters were normal.   2. Right ventricular systolic function is normal. The right ventricular  size is normal. Tricuspid regurgitation signal is inadequate for assessing  PA pressure.   3. The mitral valve is degenerative. No evidence of mitral valve  regurgitation. No evidence of mitral stenosis. Moderate mitral annular  calcification.   4. The aortic valve is calcified. There is moderate calcification of the  aortic valve. There is moderate thickening of the aortic valve. Aortic  valve regurgitation is not visualized. Mild aortic valve stenosis. Aortic  valve area, by VTI measures 1.55  cm. Aortic valve mean gradient measures 7.5 mmHg. Aortic valve Vmax  measures 1.99 m/s.   5. The inferior vena cava is normal in size with greater than 50%  respiratory variability, suggesting right atrial pressure of 3 mmHg.   6. Aortic dilatation noted. There is mild dilatation of the aortic root,  measuring 39 mm.   )         Anesthesia Quick Evaluation  "

## 2024-08-09 NOTE — Progress Notes (Signed)
 SDW CALL  Patient was given pre-op  instructions over the phone. The opportunity was given for the patient to ask questions. No further questions asked. Patient verbalized understanding of instructions given.  Consent states that the procedure is for his right arm but patient states it is supposed to be left arm - notified Alan Glance at Dr. Lanis' office.     PCP - Cheryle Frees Cardiologist - denies  PPM/ICD - denies Device Orders - n/a Rep Notified - n/a  Chest x-ray - denies EKG - 05/10/24 Stress Test - denies ECHO - 05/08/24 Cardiac Cath - denies  Sleep Study - denies  No DM  Last dose of GLP1 agonist-  n/a GLP1 instructions:  n/a  Blood Thinner Instructions: n/a Aspirin  Instructions: n/a  ERAS Protcol - NPO PRE-SURGERY Ensure or G2- n/a  COVID TEST- no   Anesthesia review: yes - A fib, ESRD, HTN  Patient denies shortness of breath, fever, cough and chest pain over the phone call   All instructions explained to the patient, with a verbal understanding of the material. Patient agrees to go over the instructions while at home for a better understanding.

## 2024-08-09 NOTE — Progress Notes (Signed)
 Anesthesia Chart Review: Same day workup  68 year old male with pertinent history including HTN, renal carcinoma, CKD with recent progression to ESRD.  Patient was admitted 9/24 through 05/11/2024 after presenting with abdominal discomfort and fullness.  Workup showed large right-sided renal mass suspicious for RCC.  During admission he progressed from CKD 5 to ESRD and was initiated on HD via catheter.  He was also noted to have new onset of paroxysmal A-fib with RVR on 05/07/2024.  He was seen by cardiology and started on oral Cardizem  and Coreg  and converted to sinus rhythm.  He had another episode of A-fib RVR 05/10/24 requiring Cardizem  drip.  Echo showed LVEF 60 to 65%, moderate concentric LVH, normal RV.  Report states mild aortic stenosis with MG 7.5 mmHg and valve area 1.55 cm.  Anticoagulation was deferred in the setting of new diagnosis of RCC and planned nephrectomy.  He is currently on carvedilol  25 mg twice daily and Cardizem  CD3 360 mg daily.  Patient subsequently underwent right radical nephrectomy at [facility] on 06/10/2024.  He was seen in outpatient follow-up by urology on 06/28/2024 and noted to be doing well.  Per note,  He is s/p right nephrectomy 06/10/24 after an extremely large tumor was noted (18 cm) during work-up for weight loss and abdominal discomfort. He looks very well today and has been doing well in the initial post-op period.SABRASABRAOf note, he remains on HD 3x/week but still voids normally. He had post-op retention and passed his TOV a few days ago at a local ER. His PVR today was unremarkable and I encouraged him to pick up and start taking Flomax prescribed by his nephrologist.  Currently dialyzing MWF via right IJ.  Per anesthesia intubation note 06/10/2024, video laryngoscope was used.  Patient will need day of surgery labs and evaluation.  TTE 04/30/2024: 1. Left ventricular ejection fraction, by estimation, is 60 to 65%. The  left ventricle has normal function. The left  ventricle has no regional  wall motion abnormalities. There is moderate concentric left ventricular  hypertrophy. Left ventricular  diastolic parameters were normal.   2. Right ventricular systolic function is normal. The right ventricular  size is normal. Tricuspid regurgitation signal is inadequate for assessing  PA pressure.   3. The mitral valve is degenerative. No evidence of mitral valve  regurgitation. No evidence of mitral stenosis. Moderate mitral annular  calcification.   4. The aortic valve is calcified. There is moderate calcification of the  aortic valve. There is moderate thickening of the aortic valve. Aortic  valve regurgitation is not visualized. Mild aortic valve stenosis. Aortic  valve area, by VTI measures 1.55  cm. Aortic valve mean gradient measures 7.5 mmHg. Aortic valve Vmax  measures 1.99 m/s.   5. The inferior vena cava is normal in size with greater than 50%  respiratory variability, suggesting right atrial pressure of 3 mmHg.   6. Aortic dilatation noted. There is mild dilatation of the aortic root,  measuring 39 mm.     Lynwood Geofm RIGGERS [facility] Short Stay Center/Anesthesiology Phone [phone] 08/09/2024 10:44 AM

## 2024-08-10 ENCOUNTER — Other Ambulatory Visit: Payer: Self-pay

## 2024-08-15 ENCOUNTER — Encounter (HOSPITAL_COMMUNITY): Payer: Self-pay | Admitting: Vascular Surgery

## 2024-08-16 ENCOUNTER — Ambulatory Visit (HOSPITAL_BASED_OUTPATIENT_CLINIC_OR_DEPARTMENT_OTHER): Payer: Self-pay | Admitting: Physician Assistant

## 2024-08-16 ENCOUNTER — Other Ambulatory Visit: Payer: Self-pay

## 2024-08-16 ENCOUNTER — Encounter (HOSPITAL_COMMUNITY): Payer: Self-pay | Admitting: Physician Assistant

## 2024-08-16 ENCOUNTER — Ambulatory Visit (HOSPITAL_COMMUNITY)
Admission: RE | Admit: 2024-08-16 | Discharge: 2024-08-16 | Disposition: A | Discharge status: Home / Self Care | Attending: Vascular Surgery | Admitting: Vascular Surgery

## 2024-08-16 ENCOUNTER — Other Ambulatory Visit (HOSPITAL_COMMUNITY): Payer: Self-pay

## 2024-08-16 ENCOUNTER — Encounter (HOSPITAL_COMMUNITY): Payer: Self-pay | Admitting: Vascular Surgery

## 2024-08-16 ENCOUNTER — Encounter (HOSPITAL_COMMUNITY)
Admission: RE | Disposition: A | Discharge status: Home / Self Care | Payer: Self-pay | Source: Home / Self Care | Attending: Vascular Surgery

## 2024-08-16 DIAGNOSIS — Z85528 Personal history of other malignant neoplasm of kidney: Secondary | ICD-10-CM | POA: Insufficient documentation

## 2024-08-16 DIAGNOSIS — N186 End stage renal disease: Secondary | ICD-10-CM | POA: Diagnosis not present

## 2024-08-16 DIAGNOSIS — I350 Nonrheumatic aortic (valve) stenosis: Secondary | ICD-10-CM | POA: Insufficient documentation

## 2024-08-16 DIAGNOSIS — Z992 Dependence on renal dialysis: Secondary | ICD-10-CM | POA: Insufficient documentation

## 2024-08-16 DIAGNOSIS — I480 Paroxysmal atrial fibrillation: Secondary | ICD-10-CM | POA: Diagnosis not present

## 2024-08-16 DIAGNOSIS — D631 Anemia in chronic kidney disease: Secondary | ICD-10-CM | POA: Diagnosis not present

## 2024-08-16 DIAGNOSIS — Z905 Acquired absence of kidney: Secondary | ICD-10-CM | POA: Insufficient documentation

## 2024-08-16 DIAGNOSIS — Z79899 Other long term (current) drug therapy: Secondary | ICD-10-CM | POA: Insufficient documentation

## 2024-08-16 DIAGNOSIS — I120 Hypertensive chronic kidney disease with stage 5 chronic kidney disease or end stage renal disease: Secondary | ICD-10-CM

## 2024-08-16 HISTORY — DX: Thoracic aortic ectasia: I77.810

## 2024-08-16 HISTORY — DX: Cardiac murmur, unspecified: R01.1

## 2024-08-16 HISTORY — DX: Hyperlipidemia, unspecified: E78.5

## 2024-08-16 HISTORY — DX: Unspecified atrial fibrillation: I48.91

## 2024-08-16 HISTORY — PX: AV FISTULA PLACEMENT: SHX1204

## 2024-08-16 MED ORDER — SODIUM CHLORIDE 0.9 % IV SOLN: INTRAVENOUS | Status: DC

## 2024-08-16 MED ORDER — FENTANYL CITRATE (PF) 100 MCG/2ML IJ SOLN
INTRAMUSCULAR | Status: DC | PRN
  Administered 2024-08-16: 100 ug via INTRAVENOUS

## 2024-08-16 MED ORDER — MIDAZOLAM HCL (PF) 2 MG/2ML IJ SOLN
INTRAMUSCULAR | Status: DC | PRN
  Administered 2024-08-16: 1 mg via INTRAVENOUS

## 2024-08-16 MED ORDER — FENTANYL CITRATE (PF) 100 MCG/2ML IJ SOLN: 25.0000 ug | INTRAMUSCULAR | Status: DC | PRN

## 2024-08-16 MED ORDER — PROPOFOL 500 MG/50ML IV EMUL
INTRAVENOUS | Status: DC | PRN
  Administered 2024-08-16: 50 ug/kg/min via INTRAVENOUS

## 2024-08-16 MED ORDER — ROPIVACAINE HCL 5 MG/ML IJ SOLN
INTRAMUSCULAR | Status: DC | PRN
  Administered 2024-08-16: 30 mL via PERINEURAL

## 2024-08-16 MED ORDER — CHLORHEXIDINE GLUCONATE 0.12 % MT SOLN
15.0000 mL | Freq: Once | OROMUCOSAL | Status: AC
  Administered 2024-08-16: 15 mL via OROMUCOSAL
  Filled 2024-08-16: qty 15

## 2024-08-16 MED ORDER — CHLORHEXIDINE GLUCONATE 4 % EX SOLN: 60.0000 mL | Freq: Once | CUTANEOUS | Status: DC

## 2024-08-16 MED ORDER — ORAL CARE MOUTH RINSE: 15.0000 mL | Freq: Once | OROMUCOSAL | Status: AC

## 2024-08-16 MED ORDER — HEPARIN 6000 UNIT IRRIGATION SOLUTION
Status: DC | PRN
  Administered 2024-08-16: 1

## 2024-08-16 MED ORDER — ONDANSETRON HCL 4 MG/2ML IJ SOLN: 4.0000 mg | Freq: Once | INTRAMUSCULAR | Status: DC | PRN

## 2024-08-16 MED ORDER — OXYCODONE HCL 5 MG PO TABS: 5.0000 mg | ORAL_TABLET | Freq: Once | ORAL | Status: DC | PRN

## 2024-08-16 MED ORDER — MIDAZOLAM HCL 2 MG/2ML IJ SOLN
INTRAMUSCULAR | Status: AC
  Filled 2024-08-16: qty 2

## 2024-08-16 MED ORDER — CEFAZOLIN SODIUM-DEXTROSE 2-4 GM/100ML-% IV SOLN
2.0000 g | INTRAVENOUS | Status: AC
  Administered 2024-08-16: 2 g via INTRAVENOUS
  Filled 2024-08-16: qty 100

## 2024-08-16 MED ORDER — HEPARIN SODIUM (PORCINE) 1000 UNIT/ML IJ SOLN
INTRAMUSCULAR | Status: DC | PRN
  Administered 2024-08-16: 2000 [IU] via INTRAVENOUS

## 2024-08-16 MED ORDER — OXYCODONE HCL 5 MG PO TABS
5.0000 mg | ORAL_TABLET | ORAL | 0 refills | Status: AC | PRN
  Filled 2024-08-16: qty 6, 1d supply, fill #0

## 2024-08-16 MED ORDER — 0.9 % SODIUM CHLORIDE (POUR BTL) OPTIME
TOPICAL | Status: DC | PRN
  Administered 2024-08-16: 1000 mL

## 2024-08-16 MED ORDER — HEPARIN SODIUM (PORCINE) 1000 UNIT/ML IJ SOLN
INTRAMUSCULAR | Status: AC
  Filled 2024-08-16: qty 10

## 2024-08-16 MED ORDER — HEPARIN 6000 UNIT IRRIGATION SOLUTION
Status: AC
  Filled 2024-08-16: qty 500

## 2024-08-16 MED ORDER — OXYCODONE HCL 5 MG/5ML PO SOLN: 5.0000 mg | Freq: Once | ORAL | Status: DC | PRN

## 2024-08-16 MED ORDER — FENTANYL CITRATE (PF) 100 MCG/2ML IJ SOLN
INTRAMUSCULAR | Status: AC
  Filled 2024-08-16: qty 2

## 2024-08-16 NOTE — Anesthesia Procedure Notes (Signed)
 Anesthesia Regional Block: Supraclavicular block   Pre-Anesthetic Checklist: , timeout performed,  Correct Patient, Correct Site, Correct Laterality,  Correct Procedure, Correct Position, site marked,  Risks and benefits discussed,  Surgical consent,  Pre-op  evaluation,  At surgeon's request and post-op pain management  Laterality: Left  Prep: chloraprep       Needles:  Injection technique: Single-shot  Needle Type: Echogenic Stimulator Needle     Needle Length: 10cm  Needle Gauge: 21   Needle insertion depth (cm): 7   Additional Needles:   Procedures:,,,, ultrasound used (permanent image in chart),,   Motor weakness within 5 minutes.  Narrative:  Start time: 08/16/2024 7:13 AM End time: 08/16/2024 7:18 AM Injection made incrementally with aspirations every 5 mL.  Performed by: Personally  Anesthesiologist: Jerrye Sharper, MD  Additional Notes: Timeout performed. Patient sedated. Relevant anatomy ID'd using US . Incremental 2-5ml injection of LA with frequent aspiration. Patient tolerated procedure well.

## 2024-08-16 NOTE — Anesthesia Postprocedure Evaluation (Signed)
"   Anesthesia Post Note  Patient: Marvin Garcia.  Procedure(s) Performed: LEFT BRACHIOCEPHALIC ARTERIOVENOUS FISTULA CREATION (Left: Arm Upper)     Patient location during evaluation: PACU Anesthesia Type: Regional and MAC Level of consciousness: awake and alert and oriented Pain management: pain level controlled Vital Signs Assessment: post-procedure vital signs reviewed and stable Respiratory status: spontaneous breathing, nonlabored ventilation and respiratory function stable Cardiovascular status: stable and blood pressure returned to baseline Postop Assessment: no apparent nausea or vomiting Anesthetic complications: no   No notable events documented.  Last Vitals:  Vitals:   08/16/24 0915 08/16/24 0930  BP: 121/76 127/75  Pulse: (!) 59 60  Resp: 14 14  Temp:  36.7 C  SpO2: 94% 94%    Last Pain:  Vitals:   08/16/24 0856  TempSrc:   PainSc: 0-No pain                 Kona Yusuf A.      "

## 2024-08-16 NOTE — Transfer of Care (Signed)
 Immediate Anesthesia Transfer of Care Note  Patient: Marvin Garcia.  Procedure(s) Performed: LEFT BRACHIOCEPHALIC ARTERIOVENOUS FISTULA CREATION (Left: Arm Upper)  Patient Location: PACU  Anesthesia Type:MAC combined with regional for post-op pain  Level of Consciousness: awake, alert , and oriented  Airway & Oxygen Therapy: Patient Spontanous Breathing  Post-op Assessment: Report given to RN and Post -op Vital signs reviewed and stable  Post vital signs: Reviewed and stable  Last Vitals:  Vitals Value Taken Time  BP 112/78 08/16/24 08:56  Temp 98   Pulse 65 08/16/24 08:57  Resp 16   SpO2 93 % 08/16/24 08:57  Vitals shown include unfiled device data.  Last Pain:  Vitals:   08/16/24 0644  TempSrc:   PainSc: 0-No pain         Complications: No notable events documented.

## 2024-08-16 NOTE — H&P (Signed)
 " Office Note   Patient seen and examined in preop holding.  No complaints. No changes to medication history or physical exam since last seen in clinic. After discussing the risks and benefits of left arm fistula, Marvin Garcia. elected to proceed.   Fonda FORBES Rim MD   CC:  ESRD Requesting Provider:  No ref. provider found  HPI: Marvin Kneece. is a Right handed 69 y.o. (1956/02/13) male with kidney disease who presents at the request of No ref. provider found for permanent HD access. The patient has had no prior access procedures. Per pt, previous tunneled lines have been placed in right IJ. Current access is Right IJ. Dialysis days are MWF.   On exam, Marvin Garcia.  A native of [place], he has lived in [place] for a number of years.  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  He is happily married for the last 45 years with 2 children, and 2 grandchildren.  Marvin Garcia was aware that he needed long-term HD access.  Past Medical History:  Diagnosis Date   Aortic root dilatation    Arrhythmia 1991   patient  describes that he woke up surrounded by doctors saying that his heart had stopped while sleeping; denies having pain during this event; also denies any reccurrent issues after that episode; says that was when they told me i had high blood pressure    Arthritis    Atrial fibrillation (HCC)    Chronic kidney disease    stage IV managed by Dr Arthur[facility]    Headache    Hyperlipidemia    Hypertension    Systolic murmur    Umbilical hernia     Past Surgical History:  Procedure Laterality Date   HERNIA REPAIR     inguinal    INSERTION OF MESH N/A 01/21/2017   Procedure: INSERTION OF MESH;  Surgeon: Signe Mitzie LABOR, MD;  Location: [facility];  Service: General;  Laterality: N/A;   IR TUNNELED CENTRAL VENOUS CATH PLC W IMG  05/07/2024   JOINT REPLACEMENT     KNEE ARTHROSCOPY     TOTAL HIP ARTHROPLASTY Left 03/19/2017    Procedure: LEFT TOTAL HIP ARTHROPLASTY ANTERIOR APPROACH;  Surgeon: Fidel Rogue, MD;  Location: [facility];  Service: Orthopedics;  Laterality: Left;  Needs RNFA   TOTAL KNEE ARTHROPLASTY  06/04/2012   Procedure: TOTAL KNEE ARTHROPLASTY;  Surgeon: Lamar Collet, MD;  Location: [facility];  Service: Orthopedics;  Laterality: Left;   TOTAL KNEE ARTHROPLASTY Right 10/14/2019   Procedure: TOTAL KNEE ARTHROPLASTY;  Surgeon: Collet Lamar, MD;  Location: [facility];  Service: Orthopedics;  Laterality: Right;  adductor canal   UMBILICAL HERNIA REPAIR N/A 01/21/2017   Procedure: UMBILICAL HERNIA REPAIR WITH MESH;  Surgeon: Signe Mitzie LABOR, MD;  Location: [facility];  Service: General;  Laterality: N/A;    Social History   Socioeconomic History   Marital status: Married    Spouse name: Not on file   Number of children: Not on file   Years of education: Not on file   Highest education level: Not on file  Occupational History   Not on file  Tobacco Use   Smoking status: Never   Smokeless tobacco: Never  Vaping Use   Vaping status: Never Used  Substance and Sexual Activity   Alcohol use: No   Drug use: No   Sexual activity: Not on file  Other Topics Concern   Not on file  Social History Narrative   Dialysis M-W-F   Right Handed   Social Drivers of Health   Tobacco Use: Low Risk (08/16/2024)   Patient History    Smoking Tobacco Use: Never    Smokeless Tobacco Use: Never    Passive Exposure: Not on file  Financial Resource Strain: Low Risk  (06/16/2024)   Received from [facility]   Overall Financial Resource Strain (CARDIA)    Difficulty of Paying Living Expenses: Not hard at all  Food Insecurity: No Food Insecurity (06/16/2024)   Received from [facility]   Epic    Within the past 12 months, you worried that your food would run out before you got the money to buy more.: Never true    Within the past 12 months, the food you bought just didn't last and you  didn't have money to get more.: Never true  Transportation Needs: No Transportation Needs (06/16/2024)   Received from [facility] - Transportation    In the past 12 months, has lack of transportation kept you from medical appointments or from getting medications?: No    Lack of Transportation (Non-Medical): No  Physical Activity: Not on file  Stress: Not on file  Social Connections: Moderately Isolated (05/05/2024)   Social Connection and Isolation Panel    Frequency of Communication with Friends and Family: Twice a week    Frequency of Social Gatherings with Friends and Family: Never    Attends Religious Services: More than 4 times per year    Active Member of [employer] or Organizations: No    Attends Banker Meetings: Never    Marital Status: Married  Catering Manager Violence: Not At Risk (05/05/2024)   Epic    Fear of Current or Ex-Partner: No    Emotionally Abused: No    Physically Abused: No    Sexually Abused: No  Depression (PHQ2-9): Not on file  Alcohol Screen: Not on file  Housing: Low Risk  (06/09/2024)   Received from [facility]   Epic    In the last 12 months, was there a time when you were not able to pay the mortgage or rent on time?: No    In the past 12 months, how many times have you moved where you were living?: 0    At any time in the past 12 months, were you homeless or living in a shelter (including now)?: No  Utilities: Not At Risk (06/09/2024)   Received from [facility]   Epic    In the past 12 months has the electric, gas, oil, or water  company threatened to shut off services in your home?: No  Health Literacy: Not on file   Family History  Problem Relation Age of Onset   Diabetes Mother    Heart disease Father    Asthma Son    Diabetes Maternal Grandmother     Current Facility-Administered Medications  Medication Dose Route Frequency Provider Last Rate Last Admin   0.9 %   sodium chloride  infusion   Intravenous Continuous Amber Williard E, MD       0.9 %  sodium chloride  infusion   Intravenous Continuous Jerrye Sharper, MD       ceFAZolin  (ANCEF ) IVPB 2g/100 mL premix  2 g Intravenous 30 min Pre-Op  Juris Gosnell E, MD       chlorhexidine  (HIBICLENS ) 4 % liquid 4 Application  60 mL Topical Once Lanis Chew  E, MD       And   [START ON 08/17/2024] chlorhexidine  (HIBICLENS ) 4 % liquid 4 Application  60 mL Topical Once Lanis Fonda BRAVO, MD        Allergies[1]   REVIEW OF SYSTEMS:  [X]  denotes positive finding, [ ]  denotes negative finding Cardiac  Comments:  Chest pain or chest pressure:    Shortness of breath upon exertion:    Short of breath when lying flat:    Irregular heart rhythm:        Vascular    Pain in calf, thigh, or hip brought on by ambulation:    Pain in feet at night that wakes you up from your sleep:     Blood clot in your veins:    Leg swelling:         Pulmonary    Oxygen at home:    Productive cough:     Wheezing:         Neurologic    Sudden weakness in arms or legs:     Sudden numbness in arms or legs:     Sudden onset of difficulty speaking or slurred speech:    Temporary loss of vision in one eye:     Problems with dizziness:         Gastrointestinal    Blood in stool:     Vomited blood:         Genitourinary    Burning when urinating:     Blood in urine:        Psychiatric    Major depression:         Hematologic    Bleeding problems:    Problems with blood clotting too easily:        Skin    Rashes or ulcers:        Constitutional    Fever or chills:      PHYSICAL EXAMINATION:  Vitals:   08/16/24 0555  BP: (!) 154/88  Pulse: 64  Resp: 17  Temp: 98.4 F (36.9 C)  TempSrc: Oral  SpO2: 96%  Weight: 77.1 kg  Height: 5' 7 (1.702 m)    General:  WDWN in NAD; vital signs documented above Gait: Not observed HENT: WNL, normocephalic Pulmonary: normal non-labored breathing , without Rales,  rhonchi,  wheezing Cardiac: regular HR, Abdomen: soft, NT, no masses Skin: without rashes Vascular Exam/Pulses:  Right Left  Radial 2+ (normal) 2+ (normal)  Ulnar 2+ (normal) 2+ (normal)  Femoral    Popliteal    DP    PT     Extremities: without ischemic changes, without Gangrene , without cellulitis; without open wounds;  Musculoskeletal: no muscle wasting or atrophy  Neurologic: A&O X 3;  No focal weakness or paresthesias are detected Psychiatric:  The pt has Normal affect.   Non-Invasive Vascular Imaging:   See studies    ASSESSMENT/PLAN:  Marvin Lennartz. is a 69 y.o. male who presents with end stage renal disease  Based on vein mapping and examination, Marvin Garcia is a candidate for fistula creation in either extremity.  Being that he is right-handed, we discussed left arm fistula creation.  I think he would be best served with left arm brachiocephalic fistula creation. I had an extensive discussion with this patient in regards to the nature of access surgery, including risk, benefits, and alternatives.   The patient is aware that the risks of access surgery include but are not limited to: bleeding, infection, steal syndrome, nerve damage,  ischemic monomelic neuropathy, failure of access to mature, complications related to venous hypertension, and possible need for additional access procedures in the future.  I discussed with the patient the nature of the staged access procedure, specifically the need for a second operation to transpose the first stage fistula if it matures adequately.   The patient has  agreed to proceed with the above procedure which will be scheduled at his convenience.  Fonda FORBES Rim, MD [facility] [phone]     [1]  Allergies Allergen Reactions   Hydrocodone Other (See Comments)    Severe hiccups   "

## 2024-08-16 NOTE — Discharge Instructions (Signed)
 "  [facility]  Discharge Instructions  AV Fistula or Graft Surgery for Dialysis Access  Please refer to the following instructions for your post-procedure care. Your surgeon or physician assistant will discuss any changes with you.  Activity  You may drive the day following your surgery, if you are comfortable and no longer taking prescription pain medication. Resume full activity as the soreness in your incision resolves.  Bathing/Showering  You may shower after you go home. Keep your incision dry for 48 hours. Do not soak in a bathtub, hot tub, or swim until the incision heals completely. You may not shower if you have a hemodialysis catheter.  Incision Care  Clean your incision with mild soap and water  after 48 hours. Pat the area dry with a clean towel. You do not need a bandage unless otherwise instructed. Do not apply any ointments or creams to your incision. You may have skin glue on your incision. Do not peel it off. It will come off on its own in about one week. Your arm may swell a bit after surgery. To reduce swelling use pillows to elevate your arm so it is above your heart. Your doctor will tell you if you need to lightly wrap your arm with an ACE bandage.  Diet  Resume your normal diet. There are not special food restrictions following this procedure. In order to heal from your surgery, it is CRITICAL to get adequate nutrition. Your body requires vitamins, minerals, and protein. Vegetables are the best source of vitamins and minerals. Vegetables also provide the perfect balance of protein. Processed food has little nutritional value, so try to avoid this.  Medications  Resume taking all of your medications. If your incision is causing pain, you may take over-the counter pain relievers such as acetaminophen  (Tylenol ). If you were prescribed a stronger pain medication, please be aware these medications can cause nausea and constipation. Prevent  nausea by taking the medication with a snack or meal. Avoid constipation by drinking plenty of fluids and eating foods with high amount of fiber, such as fruits, vegetables, and grains.  Do not take Tylenol  if you are taking prescription pain medications.  Follow up Your surgeon may want to see you in the office following your access surgery. If so, this will be arranged at the time of your surgery.  Please call us  immediately for any of the following conditions:  Increased pain, redness, drainage (pus) from your incision site Fever of 101 degrees or higher Severe or worsening pain at your incision site Hand pain or numbness.  Reduce your risk of vascular disease:  Stop smoking. If you would like help, call [facility] at 1-800-QUIT-NOW (581-559-7018) or [place] at 559-339-6273  Manage your cholesterol Maintain a desired weight Control your diabetes Keep your blood pressure down  Dialysis  It will take several weeks to several months for your new dialysis access to be ready for use. Your surgeon will determine when it is okay to use it. Your nephrologist will continue to direct your dialysis. You can continue to use your Permcath until your new access is ready for use.   08/16/2024 Marvin Garcia. [id number] [birth year]  Surgeon(s): Lanis Fonda BRAVO, MD  Procedures: LEFT BRACHIOCEPHALIC ARTERIOVENOUS FISTULA CREATION   May stick graft immediately   May stick graft on designated area only:   x Do not stick fistula for 12 weeks    If you have any questions, please call the office at [phone].  "

## 2024-08-16 NOTE — Op Note (Signed)
" ° ° °  NAME: Marvin Garcia.    MRN: [id number] DOB: 12/16/1955    DATE OF OPERATION: 08/16/2024  PREOP DIAGNOSIS:    End stage renal disease requiring dialysis  POSTOP DIAGNOSIS:    Same  PROCEDURE:    Left brachiocephalic fistula  SURGEON: Fonda FORBES Rim  ASSIST: Sherrilee Holster, PA  ANESTHESIA: Block, moderate   EBL: 10ml  INDICATIONS:    Marvin Garcia. is a 69 y.o. male with end stage renal disease in need of long term HD access.   FINDINGS:   5mm brachial artery  5mm cephalic vein   TECHNIQUE:   The patient was brought to the operating room and placed in supine position. The left arm was prepped and draped in standard fashion. IV antibiotics were administered. A timeout was performed.   The case began with ultrasound insonation of the brachial artery and cephalic vein, which demonstrated sufficient size at the antecubital fossa for arteriovenous fistula.   A transverse incision was made below the elbow creese in the antecubital fossa. The  cephalic vein was isolated for 3 cm in length. Next the aponeurosis was partially released and the brachial artery secured with a vessel loop. The patient was heparinized. The cephalic vein was transected and ligated distally with a 2-0 silk stick-tie. The vein was dilated with coronary dilators and flushed with heparin  saline. Vascular clamps were placed proximally and distally on the brachial artery and a 5 mm arteriotomy was created on the brachial artery. This was flushed with heparin  saline.  An anastomosis was created in end to side fashion on the brachial artery using running 6-0 Prolene suture.  Prior to completing the anastomosis, the vessels were flushed and the suture line was tied down. There was an excellent thrill in the cephalic vein from the anastomosis to the mid upper bicipital region. The patient had a 1+ palpable radial pulsel. He had an excellent thrill in the fistula. The incision was irrigated and  hemostasis achieved with cautery and suture. The deeper tissue was closed with 3-0 Vicryl and the skin closed with 4-0 Monocryl.  Dermabond was applied to the incisions. The patient was transferred to PACU in stable condition.  Given the complexity of the case,  the assistant was necessary in order to expedient the procedure and safely perform the technical aspects of the operation.  The assistant provided traction and countertraction to assist with exposure of the artery and vein.  They also assisted with suture ligation of multiple venous branches.  They played a critical role in the anastomosis. These skills, especially following the Prolene suture for the anastomosis, could not have been adequately performed by a scrub tech assistant.    Fonda FORBES Rim, MD [facility] DATE OF DICTATION:   08/16/2024  "

## 2024-08-17 ENCOUNTER — Encounter (HOSPITAL_COMMUNITY): Payer: Self-pay | Admitting: Vascular Surgery

## 2024-08-17 LAB — POCT I-STAT, CHEM 8
BUN: 62 mg/dL — ABNORMAL HIGH (ref 8–23)
Calcium, Ion: 1.23 mmol/L (ref 1.15–1.40)
Chloride: 101 mmol/L (ref 98–111)
Creatinine, Ser: 7.6 mg/dL — ABNORMAL HIGH (ref 0.61–1.24)
Glucose, Bld: 87 mg/dL (ref 70–99)
HCT: 39 % (ref 39.0–52.0)
Hemoglobin: 13.3 g/dL (ref 13.0–17.0)
Potassium: 5.6 mmol/L — ABNORMAL HIGH (ref 3.5–5.1)
Sodium: 136 mmol/L (ref 135–145)
TCO2: 24 mmol/L (ref 22–32)

## 2024-08-23 ENCOUNTER — Telehealth (HOSPITAL_COMMUNITY): Payer: Self-pay

## 2024-08-23 NOTE — Telephone Encounter (Signed)
 Patient has not received retacrit  since September. Called [facility] to see if orders have been discontinued. Left voicemail message for Amber to fax over d/c orders if patient is not to receive this anymore. Awaiting fax.

## 2024-09-01 ENCOUNTER — Other Ambulatory Visit: Payer: Self-pay | Admitting: Vascular Surgery

## 2024-09-01 DIAGNOSIS — N185 Chronic kidney disease, stage 5: Secondary | ICD-10-CM

## 2024-09-22 ENCOUNTER — Ambulatory Visit (HOSPITAL_COMMUNITY)
